# Patient Record
Sex: Male | Born: 1955
Health system: Southern US, Community
[De-identification: ages and names within clinical notes are randomized; demographics above are authoritative.]

## PROBLEM LIST (undated history)

## (undated) DIAGNOSIS — F79 Unspecified intellectual disabilities: Secondary | ICD-10-CM

## (undated) DIAGNOSIS — E876 Hypokalemia: Secondary | ICD-10-CM

## (undated) DIAGNOSIS — R05 Cough: Secondary | ICD-10-CM

## (undated) DIAGNOSIS — N400 Enlarged prostate without lower urinary tract symptoms: Secondary | ICD-10-CM

## (undated) DIAGNOSIS — R1310 Dysphagia, unspecified: Secondary | ICD-10-CM

## (undated) DIAGNOSIS — Z8489 Family history of other specified conditions: Secondary | ICD-10-CM

## (undated) DIAGNOSIS — R053 Chronic cough: Secondary | ICD-10-CM

## (undated) DIAGNOSIS — R41841 Cognitive communication deficit: Secondary | ICD-10-CM

## (undated) DIAGNOSIS — T8859XA Other complications of anesthesia, initial encounter: Secondary | ICD-10-CM

## (undated) DIAGNOSIS — F72 Severe intellectual disabilities: Secondary | ICD-10-CM

## (undated) DIAGNOSIS — A419 Sepsis, unspecified organism: Secondary | ICD-10-CM

## (undated) DIAGNOSIS — T4145XA Adverse effect of unspecified anesthetic, initial encounter: Secondary | ICD-10-CM

## (undated) DIAGNOSIS — M4000 Postural kyphosis, site unspecified: Secondary | ICD-10-CM

## (undated) DIAGNOSIS — I10 Essential (primary) hypertension: Secondary | ICD-10-CM

## (undated) DIAGNOSIS — N390 Urinary tract infection, site not specified: Secondary | ICD-10-CM

## (undated) DIAGNOSIS — M6281 Muscle weakness (generalized): Secondary | ICD-10-CM

## (undated) DIAGNOSIS — I509 Heart failure, unspecified: Secondary | ICD-10-CM

## (undated) DIAGNOSIS — E870 Hyperosmolality and hypernatremia: Secondary | ICD-10-CM

## (undated) DIAGNOSIS — M1990 Unspecified osteoarthritis, unspecified site: Secondary | ICD-10-CM

## (undated) DIAGNOSIS — R269 Unspecified abnormalities of gait and mobility: Secondary | ICD-10-CM

## (undated) DIAGNOSIS — G9340 Encephalopathy, unspecified: Secondary | ICD-10-CM

## (undated) DIAGNOSIS — E1110 Type 2 diabetes mellitus with ketoacidosis without coma: Secondary | ICD-10-CM

## (undated) DIAGNOSIS — N289 Disorder of kidney and ureter, unspecified: Secondary | ICD-10-CM

## (undated) DIAGNOSIS — Z87440 Personal history of urinary (tract) infections: Secondary | ICD-10-CM

## (undated) DIAGNOSIS — E039 Hypothyroidism, unspecified: Secondary | ICD-10-CM

## (undated) DIAGNOSIS — R339 Retention of urine, unspecified: Secondary | ICD-10-CM

## (undated) DIAGNOSIS — N3289 Other specified disorders of bladder: Secondary | ICD-10-CM

## (undated) DIAGNOSIS — R7881 Bacteremia: Secondary | ICD-10-CM

## (undated) HISTORY — PX: KIDNEY STONE SURGERY: SHX686

## (undated) HISTORY — PX: OTHER SURGICAL HISTORY: SHX169

---

## 2003-02-11 ENCOUNTER — Encounter: Payer: Self-pay | Admitting: Family Medicine

## 2003-02-11 ENCOUNTER — Ambulatory Visit (HOSPITAL_COMMUNITY): Admission: RE | Admit: 2003-02-11 | Discharge: 2003-02-11 | Payer: Self-pay | Admitting: Family Medicine

## 2003-04-23 ENCOUNTER — Ambulatory Visit (HOSPITAL_COMMUNITY): Admission: RE | Admit: 2003-04-23 | Discharge: 2003-04-23 | Payer: Self-pay | Admitting: Family Medicine

## 2003-05-25 ENCOUNTER — Inpatient Hospital Stay (HOSPITAL_COMMUNITY): Admission: EM | Admit: 2003-05-25 | Discharge: 2003-05-29 | Payer: Self-pay | Admitting: Emergency Medicine

## 2003-06-01 ENCOUNTER — Emergency Department (HOSPITAL_COMMUNITY): Admission: EM | Admit: 2003-06-01 | Discharge: 2003-06-01 | Payer: Self-pay | Admitting: Emergency Medicine

## 2003-12-05 ENCOUNTER — Ambulatory Visit (HOSPITAL_COMMUNITY): Admission: RE | Admit: 2003-12-05 | Discharge: 2003-12-05 | Payer: Self-pay | Admitting: *Deleted

## 2004-08-24 ENCOUNTER — Ambulatory Visit (HOSPITAL_COMMUNITY): Admission: RE | Admit: 2004-08-24 | Discharge: 2004-08-24 | Payer: Self-pay | Admitting: Family Medicine

## 2005-07-28 ENCOUNTER — Ambulatory Visit (HOSPITAL_COMMUNITY): Admission: RE | Admit: 2005-07-28 | Discharge: 2005-07-28 | Payer: Self-pay | Admitting: Internal Medicine

## 2005-09-06 ENCOUNTER — Ambulatory Visit (HOSPITAL_COMMUNITY): Admission: RE | Admit: 2005-09-06 | Discharge: 2005-09-06 | Payer: Self-pay | Admitting: Urology

## 2005-09-23 ENCOUNTER — Inpatient Hospital Stay (HOSPITAL_COMMUNITY): Admission: RE | Admit: 2005-09-23 | Discharge: 2005-09-27 | Payer: Self-pay | Admitting: Urology

## 2005-09-28 ENCOUNTER — Encounter (HOSPITAL_COMMUNITY): Admission: RE | Admit: 2005-09-28 | Discharge: 2005-10-28 | Payer: Self-pay | Admitting: Urology

## 2005-09-28 ENCOUNTER — Ambulatory Visit (HOSPITAL_COMMUNITY): Payer: Self-pay | Admitting: Urology

## 2006-07-29 ENCOUNTER — Ambulatory Visit (HOSPITAL_COMMUNITY): Admission: RE | Admit: 2006-07-29 | Discharge: 2006-07-29 | Payer: Self-pay | Admitting: Urology

## 2006-08-10 ENCOUNTER — Ambulatory Visit (HOSPITAL_COMMUNITY): Admission: RE | Admit: 2006-08-10 | Discharge: 2006-08-10 | Payer: Self-pay | Admitting: Urology

## 2007-05-11 ENCOUNTER — Ambulatory Visit (HOSPITAL_COMMUNITY): Admission: RE | Admit: 2007-05-11 | Discharge: 2007-05-11 | Payer: Self-pay | Admitting: Urology

## 2007-09-22 ENCOUNTER — Ambulatory Visit (HOSPITAL_COMMUNITY): Admission: RE | Admit: 2007-09-22 | Discharge: 2007-09-22 | Payer: Self-pay | Admitting: Urology

## 2008-03-21 ENCOUNTER — Ambulatory Visit (HOSPITAL_COMMUNITY): Admission: RE | Admit: 2008-03-21 | Discharge: 2008-03-21 | Payer: Self-pay | Admitting: Urology

## 2008-11-20 ENCOUNTER — Ambulatory Visit (HOSPITAL_COMMUNITY): Admission: RE | Admit: 2008-11-20 | Discharge: 2008-11-20 | Payer: Self-pay | Admitting: Urology

## 2009-06-02 ENCOUNTER — Ambulatory Visit (HOSPITAL_COMMUNITY): Admission: RE | Admit: 2009-06-02 | Discharge: 2009-06-02 | Payer: Self-pay | Admitting: Urology

## 2009-06-28 ENCOUNTER — Emergency Department (HOSPITAL_COMMUNITY): Admission: EM | Admit: 2009-06-28 | Discharge: 2009-06-28 | Payer: Self-pay | Admitting: Emergency Medicine

## 2009-11-26 ENCOUNTER — Ambulatory Visit (HOSPITAL_COMMUNITY): Admission: RE | Admit: 2009-11-26 | Discharge: 2009-11-26 | Payer: Self-pay | Admitting: Urology

## 2009-12-10 ENCOUNTER — Ambulatory Visit (HOSPITAL_COMMUNITY): Admission: RE | Admit: 2009-12-10 | Discharge: 2009-12-10 | Payer: Self-pay | Admitting: Urology

## 2010-01-12 ENCOUNTER — Ambulatory Visit (HOSPITAL_COMMUNITY): Admission: RE | Admit: 2010-01-12 | Discharge: 2010-01-12 | Payer: Self-pay | Admitting: Urology

## 2010-05-05 ENCOUNTER — Ambulatory Visit (HOSPITAL_COMMUNITY)
Admission: RE | Admit: 2010-05-05 | Discharge: 2010-05-05 | Payer: Self-pay | Source: Home / Self Care | Admitting: Urology

## 2010-08-13 LAB — STONE ANALYSIS: Stone Weight KSTONE: 0.21 g

## 2010-08-14 LAB — SURGICAL PCR SCREEN
MRSA, PCR: NEGATIVE
Staphylococcus aureus: NEGATIVE

## 2010-08-14 LAB — BASIC METABOLIC PANEL
CO2: 29 mEq/L (ref 19–32)
Calcium: 9.5 mg/dL (ref 8.4–10.5)
GFR calc Af Amer: >60 mL/min (ref >60)
GFR calc non Af Amer: >60 mL/min (ref >60)
Sodium: 140 mEq/L (ref 135–145)

## 2010-08-14 LAB — CBC
Hemoglobin: 14.1 g/dL (ref 13.0–17.0)
MCH: 31.2 pg (ref 26.0–34.0)
Platelets: 261 10*3/uL (ref 150–400)
RBC: 4.53 MIL/uL (ref 4.22–5.81)
WBC: 7.6 10*3/uL (ref 4.0–10.5)

## 2010-09-01 ENCOUNTER — Other Ambulatory Visit (HOSPITAL_COMMUNITY): Payer: Self-pay | Admitting: Urology

## 2010-09-01 DIAGNOSIS — N323 Diverticulum of bladder: Secondary | ICD-10-CM

## 2010-09-01 DIAGNOSIS — N390 Urinary tract infection, site not specified: Secondary | ICD-10-CM

## 2010-09-08 ENCOUNTER — Ambulatory Visit (HOSPITAL_COMMUNITY)
Admission: RE | Admit: 2010-09-08 | Discharge: 2010-09-08 | Disposition: A | Discharge status: Home / Self Care | Payer: Medicare Other | Source: Ambulatory Visit | Attending: Urology | Admitting: Urology

## 2010-09-08 DIAGNOSIS — R9389 Abnormal findings on diagnostic imaging of other specified body structures: Secondary | ICD-10-CM | POA: Insufficient documentation

## 2010-09-08 DIAGNOSIS — N390 Urinary tract infection, site not specified: Secondary | ICD-10-CM

## 2010-09-08 DIAGNOSIS — N200 Calculus of kidney: Secondary | ICD-10-CM | POA: Insufficient documentation

## 2010-09-08 DIAGNOSIS — N323 Diverticulum of bladder: Secondary | ICD-10-CM | POA: Insufficient documentation

## 2010-10-16 NOTE — Discharge Summary (Signed)
[name], [name]                ACCOUNT NO.:  [id number]   MEDICAL RECORD NO.:  [id number]          PATIENT TYPE:  INP   LOCATION:  A323                          FACILITY:  [facility]   PHYSICIAN:  Dennie Maizes, M.D.   DATE OF BIRTH:  1955/07/14   DATE OF ADMISSION:  09/23/2005  DATE OF DISCHARGE:  04/30/2007LH                                 DISCHARGE SUMMARY   CONSULTING PHYSICIAN:  Madelin Rear. Sherwood Gambler, MD   FINAL DIAGNOSES:  1.  Large bladder calculi.  2.  Large bladder diverticulum.  3.  Recurrent urinary tract infections.  4.  Left renal calculus.   OTHER DIAGNOSIS:  Mental retardation.   OPERATIVE PROCEDURE:  Vesicolithotomy done on September 23, 2005.   COMPLICATIONS:  None.   DISCHARGE SUMMARY:  This 55 year old mentally retarded male is taken care of  by his mother.  He has a history of recurrent urinary tract infections.  His  urine was found to be cloudy.  He was not having any fever, chills or  swallowing difficulty.  There was no history of hematuria or dysuria.  Evaluation was done with CT scan of abdomen and pelvis as well as x-ray of  the KUB area.  This revealed large bladder calculi x5.  The largest calculus  measured 3 cm in size.  Four more calculi each measuring about 1.5 cm in  size were noted inside a bladder diverticula.  There was a 1-cm size left  upper pole renal calculus.   The stones were noted about 2 years ago.  The patient's family declined  surgical intervention at that time.  The stones have been increasing in size  and he has had recurrent urinary tract infections.  He has a history of  having urinary retention several years ago.   PAST MEDICAL HISTORY:  1.  Mental retardation.  2.  Recurrent urinary tract infections.   MEDICATIONS:  Zyrtec and Cipro.   ALLERGIES:  NONE.   EXAMINATION:  HEAD, EYES, EARS, NOSE AND THROAT:  Normal.  NECK:  No mass.  LUNGS:  Clear to auscultation.  HEART:  Regular rate and rhythm, no murmurs.  ABDOMEN:   Soft, no palpable flank mass or CVA tenderness.  bladder  not  palpable.  Penis and testes normal.  RECTAL: benign  prostate.   COURSE IN THE HOSPITAL:  Preoperative labs CBC WBC 6.7, hemoglobin 14.4,  hematocrit 42, BUN 12, creatinine 1, glucose elevated at 120.  Urine culture  and sensitivity revealed more than 100,000 colonies of multiple species of  organisms.  Preoperative EKG revealed normal sinus rhythm with a heart rate  of 98.   COURSE IN THE HOSPITAL:  The patient was taken to the short stay center on  September 23, 2005.  Under general anesthesia, vesicolithotomy was done.  Five  large bladder calculi were removed.  The patient was on Foley catheter  drainage postoperatively.  His vital signs were stable in the postoperative  period.  Dr. Sherwood Gambler was consulted for management of his medical problems.  He  was found to have elevated glucose and seen by Dr.  Fusco.  The patient's  previous urinalysis revealed urinalysis and he was treated with IV  vancomycin for staph UTI.  The patient did well in the postoperative period.  His JP drainage was minimal and the JP drain was removed on the second  postoperative day.  Abdominal examination was unremarkable and his labs were  stable.  BUN 9, creatinine 1.  CBC WBC 10.1, hemoglobin 13.6, hematocrit  39.7.  The patient was started on full liquid diet which was gradually  increased to regular diet.  He had bowel sounds with return of bowel  function.  He was passing flatus.  His potassium was found to be low on  September 26, 2005 and potassium supplements were given.  The patient was  started on a regular diet.  On September 27, 2005, the patient was found to be  doing well on a regular diet.  He had a bowel movement.  Abdominal  examination was unremarkable and the incision was found to be healing well.   He was discharged and sent home on September 27, 2005 with a Foley catheter and  urine bag.  IV vancomycin will be continued for three more days.  He  will be  seen by Dr. Sherwood Gambler for elevated glucose and further management.  He will  return to the office in one week at which time removal of the staples and  removal of the Foley catheter will be done.  The patient was advised to call  me for fever, chills, hematuria or blocked catheter.      Dennie Maizes, M.D.  Electronically Signed     SK/MEDQ  D:  11/22/2005  T:  11/22/2005  Job:  [id number]   cc:   Madelin Rear. Sherwood Gambler, MD  Fax: [phone]

## 2010-10-16 NOTE — Op Note (Signed)
[name], [name]                ACCOUNT NO.:  [id number]   MEDICAL RECORD NO.:  [id number]          PATIENT TYPE:  INP   LOCATION:  A323                          FACILITY:  [facility]   PHYSICIAN:  Dennie Maizes, M.D.   DATE OF BIRTH:  18-Apr-1956   DATE OF PROCEDURE:  09/23/2005  DATE OF DISCHARGE:                                 OPERATIVE REPORT   PREOPERATIVE DIAGNOSES:  Large bladder calculi x5, recurrent urinary tract  infections, bladder diverticulum.   POSTOPERATIVE DIAGNOSIS:  Large bladder calculi x5, recurrent urinary tract  infections, bladder diverticulum.   OPERATIVE PROCEDURE:  Vesical lithotomy.   ANESTHESIA:  General.   SURGEON:  Dennie Maizes, M.D.   COMPLICATIONS:  None.   ESTIMATED BLOOD LOSS:  50 mL.   DRAINS:  20-French Foley catheter in the bladder, JP drain x1 in the  prevesical space.   SPECIMEN:  Bladder calculi x5 which were given to the patient's family.   INDICATIONS FOR PROCEDURE:  This 55 year old mentally retarded male has a  history of recurrent urinary tract infections.  Evaluation revealed large  bladder calculi.  There is a 3 cm size stone in the bladder and four 1.5 cm  size stones were noted in the bladder diverticulum  arising from  the right  lateral wall.  The patient was taken to the OR today for vesical lithotomy.   DESCRIPTION OF PROCEDURE:  General anesthesia was induced and the patient  was placed on the OR table in the supine position.  The lower abdomen and  genitalia were prepped and draped in a sterile fashion.  A 20-French Foley  catheter was inserted into the bladder.  The urine was drained and urine  culture and sensitivity was done.  The bladder was then filled with 350 mL  of sterile water and the catheter was clamped.  A midline suprapubic  incision was then made.  The subcutaneous tissues are divided and the rectus  sheath was noted.  The rectus sheath was divided in the midline.  The recti  muscles were retracted  laterally.  The peritoneum was then reflected upwards  and the anterior surface of bladder was exposed.  Two stay sutures using 3-0  Vicryl was then applied over the anterior surface of the bladder.  A  cystotomy was then made.  Distal examination of the bladder revealed a large  bladder stone.  The stone was removed without any difficulty.  The bladder  wall was then distracted and the bladder was closely inspected.  There were  multiple bladder diverticula.  There was a diverticulum on the right side.  Distal exploration of the diverticulum revealed four small stones.  The  stones were extracted using stone forceps.  The patient had five stones in  total.  The bladder was then irrigated with saline.  The diverticulum was  irrigated with sterile saline.  The cystotomy incision was then closed in  two layers using 3-0 Vicryl.  Bladder irrigation was done and the  anastomosis was found to be watertight.  A JP drain was then left in the  prevesical space.  The rectus sheath was closed using #0 PDS.  The incision  was then irrigated with saline and the subcutaneous tissues were  approximated using 3-0 plain gut.  The skin was closed using staples.  The  JP drain from the prevesical area was then brought out through a separate  stab incision and anchored to the skin with 3-0 silk.  An abdominal dressing  was applied.  The sponges and instruments were correct x2 at the time of  closure.  Estimated blood loss was about 50 mL. The patient was transferred  to the PACU in a satisfactory condition.      Dennie Maizes, M.D.  Electronically Signed     SK/MEDQ  D:  09/23/2005  T:  09/24/2005  Job:  [id number]   cc:   Madelin Rear. Sherwood Gambler, MD  Fax: [phone]

## 2010-10-16 NOTE — H&P (Signed)
NAMECHARBEL, LOS                ACCOUNT NO.:  [id number]   MEDICAL RECORD NO.:  [id number]          PATIENT TYPE:  AMB   LOCATION:  DAY                           FACILITY:  [facility]   PHYSICIAN:  Dennie Maizes, M.D.   DATE OF BIRTH:  January 14, 1956   DATE OF ADMISSION:  09/23/2005  DATE OF DISCHARGE:  LH                                HISTORY & PHYSICAL   CHIEF COMPLAINT:  Recurrent urinary tract infections, large bladder calculi,  left renal calculus.   HISTORY OF PRESENT ILLNESS:  This 55 year old male is mentally retarded.  His mother takes care of him.  He has had a history of recurrent urinary  tract infections.  His urine is found to be cloudy.  He is not having any  fever, chills, or voiding difficulty at present.  There is no history of  hematuria or dysuria.  He has undergone recent evaluation with CT scan of  the abdomen and pelvis as well as x-ray of the KUB area.  This revealed a  large bladder calculus measuring 3 cm in size.  Four more calculi were noted  in the bladder and diverticulum.  These calculi measured about 1.5 cm each  in size.  There is also a 1 cm size left upper pole renal calculus.   These stones were noted in x-rays two years ago.  The patient's family  declined surgical intervention at that time.  The stones have been  increasing in size for the past few years.  He has history of having urinary  retention several years ago.   PAST MEDICAL HISTORY:  1.  History of mental retardation.  2.  Recurrent urinary tract infections.   MEDICATIONS:  Zyrtec, Cipro.   ALLERGIES:  None.   PHYSICAL EXAMINATION:  HEENT:  Normal.  NECK:  No masses.  LUNGS:  Clear to auscultation.  HEART:  Regular rate and rhythm.  No murmurs.  ABDOMEN:  Soft.  No palpable frank mass or CVA tenderness.  Bladder is not  palpable.  GENITOURINARY:  Penis normal.  Testes are normal.  RECTAL:  Benign prostate, about 34 g in size.   IMPRESSION:  1.  Large bladder calculi.  2.   Calculi in the bladder diverticulum.  3.  Recurrent urinary tract infections.  4.  Large left renal calculus.   PLAN:  I discussed with the patient at this time regarding the diagnosis and  management options.  He is cleared to undergo vesical lithotomy in short  stay center.  I explained to him regarding the diagnosis, operative details,  alternative treatments, outcome, possible risks and complications, and they  agreed for the procedure to be done.  The patient will need catheter  drainage for a week after the surgery.  He will be admitted to the hospital  in the postoperative period.      Dennie Maizes, M.D.  Electronically Signed     SK/MEDQ  D:  09/23/2005  T:  09/23/2005  Job:  [id number]   cc:   Madelin Rear. Sherwood Gambler, MD  Fax: [phone]

## 2010-10-16 NOTE — Discharge Summary (Signed)
[name], [name]                          ACCOUNT NO.:  [id number]   MEDICAL RECORD NO.:  [id number]                   PATIENT TYPE:  INP   LOCATION:  A302                                 FACILITY:  [facility]   PHYSICIAN:  Kirk Ruths, M.D.            DATE OF BIRTH:  [birth year]   DATE OF ADMISSION:  05/25/2003  DATE OF DISCHARGE:  05/29/2003                                 DISCHARGE SUMMARY   DISCHARGE DIAGNOSES:  1. Rib contusion secondary fall.  2. Urinary tract infection.  3. History of hypertension.   HOSPITAL COURSE:  This is a 55 year old, moderately retarded, white male who  is cared for by his elderly mother.  On the day of admission the patient was  turning in the room and tripped and fell and was unable to move with pain in  his chest. He was seen and evaluated in the emergency room where he  underwent multiple x-rays including chest and pelvis without significant  findings.  The patient was noted to have nitrate positive urine on admission  and was treated with white cells.  Urine culture was negative.  The patient  was treated empirically with Levaquin during this stay here and has remained  here.  I feel like simply urinary tract infection has been adequately  treated so he will not be discharged on antibiotics.   The patient received some physical therapy as he had difficulty arising and  ambulating due to soreness. There is some complaint of some pain in his  right hip area although he is able to bear weight.  Will x-ray his right hip  before discharge today and he will be discharged home. Home health will be  following the patient.   DISCHARGE MEDICATIONS:  He will take his previous medications which include:  Benicar, HCTZ, and Advil 600-800 mg 3 times a day for pain.   FOLLOW UP:  He will be followed in our office expectantly.  Stable at the  time of discharge.     ___________________________________________  Kirk Ruths, M.D.   WMM/MEDQ  D:  05/29/2003  T:  05/29/2003  Job:  [id number]

## 2010-10-16 NOTE — H&P (Signed)
[name], [name]                          ACCOUNT NO.:  [id number]   MEDICAL RECORD NO.:  [id number]                   PATIENT TYPE:  INP   LOCATION:  A302                                 FACILITY:  [facility]   PHYSICIAN:  Kirk Ruths, M.D.            DATE OF BIRTH:  [birth year]   DATE OF ADMISSION:  05/25/2003  DATE OF DISCHARGE:                                HISTORY & PHYSICAL   CHIEF COMPLAINT:  Pain on movement after a fall.   PRESENTING ILLNESS:  This is a 55 year old mental retard who lives with his  mom.  The patient before admission fell as he turned on the living room  floor which is carpeted. The patient was unable to move due to chest pain.  He was brought to the hospital by ambulance.  At the hospital he also was  noted to have some pubic tenderness, but x-rays of his pubis were negative.  Chest x-ray showed some cardiomegaly although he was unable to sit or breath  appropriately for x-ray.  Also he was noted to have urinary tract infection,  incidentally.  Due to the patient's inability to move, due to pain, he was  admitted for pain control for probable rib contusion or occult fracture.   ALLERGIES:  He is allergic to no medications.   MEDICATIONS:  Benicar HCT.   REVIEW OF SYSTEMS:  Denies nausea, vomiting, dysuria, or pain in the  extremities.   PHYSICAL EXAMINATION:  GENERAL:  A middle-aged white male appearing  comfortable lying on a gurney.  VITAL SIGNS:  The blood pressure 150/90, pulse 80, respirations 20.  HEENT:  TMs are normal.  Pupils are equal react to light and accommodation.  Oropharynx benign.  NECK:  Supple without JVD, bruit or thyromegaly.  LUNGS:  Clear in all areas.  There is right-sided chest wall tenderness.  ABDOMEN:  Soft and nontender with some questionable point tenderness in his  right suprapubic area.  EXTREMITIES:  Without clubbing, cyanosis, or edema.  NEUROLOGIC:  Exam is grossly intact.   ASSESSMENT:  1. Chest pain, rule  out occult rib fracture.  2. Hypertension.  3. Presyncope.  4. Urinary tract infection.     ___________________________________________                                         Kirk Ruths, M.D.   WMM/MEDQ  D:  05/25/2003  T:  05/25/2003  Job:  [id number]

## 2010-10-30 ENCOUNTER — Other Ambulatory Visit (HOSPITAL_COMMUNITY): Payer: Self-pay | Admitting: *Deleted

## 2010-10-30 DIAGNOSIS — N319 Neuromuscular dysfunction of bladder, unspecified: Secondary | ICD-10-CM

## 2010-10-30 DIAGNOSIS — R339 Retention of urine, unspecified: Secondary | ICD-10-CM

## 2010-11-03 ENCOUNTER — Ambulatory Visit (HOSPITAL_COMMUNITY)
Admission: RE | Admit: 2010-11-03 | Discharge: 2010-11-03 | Disposition: A | Discharge status: Home / Self Care | Payer: Medicare Other | Source: Ambulatory Visit | Attending: Urology | Admitting: Urology

## 2010-11-03 DIAGNOSIS — N323 Diverticulum of bladder: Secondary | ICD-10-CM | POA: Insufficient documentation

## 2010-11-03 DIAGNOSIS — N200 Calculus of kidney: Secondary | ICD-10-CM | POA: Insufficient documentation

## 2010-11-03 DIAGNOSIS — R339 Retention of urine, unspecified: Secondary | ICD-10-CM | POA: Insufficient documentation

## 2010-11-03 DIAGNOSIS — N319 Neuromuscular dysfunction of bladder, unspecified: Secondary | ICD-10-CM

## 2010-11-03 DIAGNOSIS — R4182 Altered mental status, unspecified: Secondary | ICD-10-CM | POA: Insufficient documentation

## 2010-11-10 ENCOUNTER — Ambulatory Visit (HOSPITAL_COMMUNITY): Payer: Medicare Other

## 2011-05-29 ENCOUNTER — Inpatient Hospital Stay (HOSPITAL_COMMUNITY)
Admission: EM | Admit: 2011-05-29 | Discharge: 2011-06-07 | DRG: 638 | Disposition: A | Discharge status: Home Health | Payer: Medicare Other | Attending: Internal Medicine | Admitting: Internal Medicine

## 2011-05-29 ENCOUNTER — Encounter: Payer: Self-pay | Admitting: *Deleted

## 2011-05-29 ENCOUNTER — Other Ambulatory Visit: Payer: Self-pay

## 2011-05-29 ENCOUNTER — Inpatient Hospital Stay (HOSPITAL_COMMUNITY): Payer: Medicare Other

## 2011-05-29 ENCOUNTER — Emergency Department (HOSPITAL_COMMUNITY): Payer: Medicare Other

## 2011-05-29 DIAGNOSIS — N19 Unspecified kidney failure: Secondary | ICD-10-CM | POA: Diagnosis present

## 2011-05-29 DIAGNOSIS — E1110 Type 2 diabetes mellitus with ketoacidosis without coma: Secondary | ICD-10-CM | POA: Diagnosis present

## 2011-05-29 DIAGNOSIS — Z79899 Other long term (current) drug therapy: Secondary | ICD-10-CM

## 2011-05-29 DIAGNOSIS — B964 Proteus (mirabilis) (morganii) as the cause of diseases classified elsewhere: Secondary | ICD-10-CM | POA: Diagnosis present

## 2011-05-29 DIAGNOSIS — N179 Acute kidney failure, unspecified: Secondary | ICD-10-CM | POA: Diagnosis present

## 2011-05-29 DIAGNOSIS — R5381 Other malaise: Secondary | ICD-10-CM | POA: Diagnosis not present

## 2011-05-29 DIAGNOSIS — E1010 Type 1 diabetes mellitus with ketoacidosis without coma: Secondary | ICD-10-CM | POA: Diagnosis not present

## 2011-05-29 DIAGNOSIS — E870 Hyperosmolality and hypernatremia: Secondary | ICD-10-CM | POA: Diagnosis not present

## 2011-05-29 DIAGNOSIS — I10 Essential (primary) hypertension: Secondary | ICD-10-CM | POA: Diagnosis present

## 2011-05-29 DIAGNOSIS — N390 Urinary tract infection, site not specified: Secondary | ICD-10-CM | POA: Diagnosis present

## 2011-05-29 DIAGNOSIS — A419 Sepsis, unspecified organism: Secondary | ICD-10-CM | POA: Diagnosis not present

## 2011-05-29 DIAGNOSIS — E876 Hypokalemia: Secondary | ICD-10-CM | POA: Diagnosis not present

## 2011-05-29 DIAGNOSIS — E875 Hyperkalemia: Secondary | ICD-10-CM | POA: Diagnosis present

## 2011-05-29 DIAGNOSIS — F79 Unspecified intellectual disabilities: Secondary | ICD-10-CM | POA: Diagnosis present

## 2011-05-29 DIAGNOSIS — R7881 Bacteremia: Secondary | ICD-10-CM | POA: Diagnosis not present

## 2011-05-29 DIAGNOSIS — D7289 Other specified disorders of white blood cells: Secondary | ICD-10-CM | POA: Diagnosis not present

## 2011-05-29 DIAGNOSIS — I509 Heart failure, unspecified: Secondary | ICD-10-CM | POA: Diagnosis not present

## 2011-05-29 DIAGNOSIS — Z23 Encounter for immunization: Secondary | ICD-10-CM

## 2011-05-29 DIAGNOSIS — R5383 Other fatigue: Secondary | ICD-10-CM | POA: Diagnosis not present

## 2011-05-29 DIAGNOSIS — N200 Calculus of kidney: Secondary | ICD-10-CM | POA: Diagnosis present

## 2011-05-29 DIAGNOSIS — E782 Mixed hyperlipidemia: Secondary | ICD-10-CM | POA: Diagnosis not present

## 2011-05-29 DIAGNOSIS — Q619 Cystic kidney disease, unspecified: Secondary | ICD-10-CM | POA: Diagnosis not present

## 2011-05-29 DIAGNOSIS — R6521 Severe sepsis with septic shock: Secondary | ICD-10-CM

## 2011-05-29 HISTORY — DX: Unspecified intellectual disabilities: F79

## 2011-05-29 HISTORY — DX: Urinary tract infection, site not specified: N39.0

## 2011-05-29 HISTORY — DX: Essential (primary) hypertension: I10

## 2011-05-29 LAB — CBC
HCT: 36.8 % — ABNORMAL LOW (ref 39.0–52.0)
Hemoglobin: 12 g/dL — ABNORMAL LOW (ref 13.0–17.0)
MCH: 31.2 pg (ref 26.0–34.0)
MCH: 31 pg (ref 26.0–34.0)
MCHC: 29 g/dL — ABNORMAL LOW (ref 30.0–36.0)
MCHC: 32.6 g/dL (ref 30.0–36.0)
MCV: 95.1 fL (ref 78.0–100.0)
RDW: 13.1 % (ref 11.5–15.5)

## 2011-05-29 LAB — DIFFERENTIAL
Eosinophils Absolute: 0 10*3/uL (ref 0.0–0.7)
Eosinophils Relative: 0 % (ref 0–5)
Monocytes Relative: 6 % (ref 3–12)
Myelocytes: 0 %
Neutro Abs: 19.7 10*3/uL — ABNORMAL HIGH (ref 1.7–7.7)
Neutrophils Relative %: 93 % — ABNORMAL HIGH (ref 43–77)
nRBC: 0 /100 WBC

## 2011-05-29 LAB — URINALYSIS, ROUTINE W REFLEX MICROSCOPIC
Bilirubin Urine: NEGATIVE
Nitrite: NEGATIVE
Protein, ur: NEGATIVE mg/dL
Urobilinogen, UA: 0.2 mg/dL (ref 0.0–1.0)

## 2011-05-29 LAB — BLOOD GAS, VENOUS
Acid-base deficit: 18.4 mmol/L — ABNORMAL HIGH (ref 0.0–2.0)
O2 Content: 21 L/min
O2 Saturation: 97.7 %
pO2, Ven: 110 mmHg — ABNORMAL HIGH (ref 30.0–45.0)

## 2011-05-29 LAB — BASIC METABOLIC PANEL
BUN: 74 mg/dL — ABNORMAL HIGH (ref 6–23)
Calcium: 7.5 mg/dL — ABNORMAL LOW (ref 8.4–10.5)
GFR calc non Af Amer: 22 mL/min — ABNORMAL LOW (ref >90)
Glucose, Bld: 1016 mg/dL (ref 70–99)
Sodium: 140 mEq/L (ref 135–145)

## 2011-05-29 LAB — POCT I-STAT, CHEM 8
BUN: 85 mg/dL — ABNORMAL HIGH (ref 6–23)
Chloride: 97 mEq/L (ref 96–112)
Creatinine, Ser: 3.5 mg/dL — ABNORMAL HIGH (ref 0.50–1.35)
Sodium: 125 mEq/L — ABNORMAL LOW (ref 135–145)
TCO2: 12 mmol/L (ref 0–100)

## 2011-05-29 LAB — COMPREHENSIVE METABOLIC PANEL
Alkaline Phosphatase: 74 U/L (ref 39–117)
BUN: 84 mg/dL — ABNORMAL HIGH (ref 6–23)
Calcium: 9 mg/dL (ref 8.4–10.5)
GFR calc Af Amer: 22 mL/min — ABNORMAL LOW (ref >90)
Glucose, Bld: 1556 mg/dL (ref 70–99)
Total Protein: 7.6 g/dL (ref 6.0–8.3)

## 2011-05-29 LAB — INFLUENZA PANEL BY PCR (TYPE A & B): H1N1 flu by pcr: NOT DETECTED

## 2011-05-29 LAB — GLUCOSE, CAPILLARY
Glucose-Capillary: >600 mg/dL (ref 70–99)
Glucose-Capillary: >600 mg/dL (ref 70–99)

## 2011-05-29 MED ORDER — SODIUM CHLORIDE 0.9 % IJ SOLN
INTRAMUSCULAR | Status: AC
  Administered 2011-05-30: 10 mL
  Filled 2011-05-29: qty 40

## 2011-05-29 MED ORDER — INSULIN REGULAR BOLUS VIA INFUSION
0.0000 [IU] | Freq: Three times a day (TID) | INTRAVENOUS | Status: DC
  Filled 2011-05-29 (×5): qty 10

## 2011-05-29 MED ORDER — VANCOMYCIN HCL 1000 MG IV SOLR
750.0000 mg | INTRAVENOUS | Status: DC
  Administered 2011-05-30: 750 mg via INTRAVENOUS
  Filled 2011-05-29 (×2): qty 750

## 2011-05-29 MED ORDER — DEXTROSE-NACL 5-0.45 % IV SOLN
INTRAVENOUS | Status: DC
  Administered 2011-05-30 (×2): via INTRAVENOUS

## 2011-05-29 MED ORDER — SODIUM CHLORIDE 0.9 % IV BOLUS (SEPSIS)
1000.0000 mL | Freq: Once | INTRAVENOUS | Status: AC
  Administered 2011-06-02: 1000 mL via INTRAVENOUS

## 2011-05-29 MED ORDER — SODIUM CHLORIDE 0.9 % IV SOLN
INTRAVENOUS | Status: DC
  Administered 2011-05-29: 23:00:00 via INTRAVENOUS

## 2011-05-29 MED ORDER — DEXTROSE 50 % IV SOLN: 25.0000 mL | INTRAVENOUS | Status: DC | PRN

## 2011-05-29 MED ORDER — SODIUM CHLORIDE 0.9 % IV SOLN
INTRAVENOUS | Status: DC
  Administered 2011-05-29: 20:00:00 via INTRAVENOUS
  Filled 2011-05-29: qty 1

## 2011-05-29 MED ORDER — SODIUM CHLORIDE 0.9 % IV SOLN: 250.0000 mL | INTRAVENOUS | Status: DC | PRN

## 2011-05-29 MED ORDER — PIPERACILLIN-TAZOBACTAM IN DEX 2-0.25 GM/50ML IV SOLN
2.2500 g | Freq: Three times a day (TID) | INTRAVENOUS | Status: DC
  Administered 2011-05-30 – 2011-05-31 (×4): 2.25 g via INTRAVENOUS
  Filled 2011-05-29 (×6): qty 50

## 2011-05-29 MED ORDER — OSELTAMIVIR PHOSPHATE 6 MG/ML PO SUSR
ORAL | Status: AC
  Filled 2011-05-29: qty 1

## 2011-05-29 MED ORDER — SODIUM CHLORIDE 0.9 % IV BOLUS (SEPSIS)
1000.0000 mL | Freq: Once | INTRAVENOUS | Status: AC
  Administered 2011-05-29: 1000 mL via INTRAVENOUS

## 2011-05-29 MED ORDER — SODIUM CHLORIDE 0.9 % IV SOLN: INTRAVENOUS | Status: DC

## 2011-05-29 MED ORDER — OSELTAMIVIR PHOSPHATE 75 MG PO CAPS: 75.0000 mg | ORAL_CAPSULE | Freq: Once | ORAL | Status: DC

## 2011-05-29 MED ORDER — DEXTROSE-NACL 5-0.45 % IV SOLN: INTRAVENOUS | Status: DC

## 2011-05-29 MED ORDER — PIPERACILLIN-TAZOBACTAM 3.375 G IVPB
3.3750 g | Freq: Once | INTRAVENOUS | Status: AC
  Administered 2011-05-29: 3.375 g via INTRAVENOUS
  Filled 2011-05-29: qty 50

## 2011-05-29 MED ORDER — HEPARIN SODIUM (PORCINE) 5000 UNIT/ML IJ SOLN
5000.0000 [IU] | Freq: Three times a day (TID) | INTRAMUSCULAR | Status: DC
  Administered 2011-05-29 – 2011-06-02 (×11): 5000 [IU] via SUBCUTANEOUS
  Filled 2011-05-29 (×14): qty 1

## 2011-05-29 MED ORDER — SODIUM CHLORIDE 0.9 % IV SOLN: INTRAVENOUS | Status: AC

## 2011-05-29 MED ORDER — SODIUM CHLORIDE 0.9 % IV SOLN
INTRAVENOUS | Status: DC
  Administered 2011-05-29: 5.4 [IU]/h via INTRAVENOUS
  Administered 2011-05-30: 19.8 [IU]/h via INTRAVENOUS
  Administered 2011-05-30: 11.5 [IU]/h via INTRAVENOUS
  Filled 2011-05-29 (×4): qty 1

## 2011-05-29 MED ORDER — INSULIN REGULAR HUMAN 100 UNIT/ML IJ SOLN
INTRAMUSCULAR | Status: AC
  Filled 2011-05-29: qty 3

## 2011-05-29 MED ORDER — VANCOMYCIN HCL IN DEXTROSE 1-5 GM/200ML-% IV SOLN: 1000.0000 mg | Freq: Once | INTRAVENOUS | Status: DC

## 2011-05-29 NOTE — ED Notes (Signed)
Pt was given tamiflu pt could not tolerate and started coughing. Pt family kept putting water into the pt's mouth despite my efforts to stop them because the pt was coughing.

## 2011-05-29 NOTE — Progress Notes (Signed)
ANTIBIOTIC CONSULT NOTE - INITIAL  Pharmacy Consult for Vancomycin & Zosyn Indication: Empiric UTI  No Known Allergies  Assessment: 55 yo M admitted from [facility] with DKA, septic shock  Pharmacist System-Based Medication Review: Anticoagulation: DVT Prophylaxsis, SQ heparin, follow CBC Infectious Disease: Septic shock, empiric UTI, timiflu 75 daily, D#0 vancomycin and zosyn Endocrinology: DKA on insulin ggt, follow glucose Gastrointestinal / Nutrition: Hyperkalemia Neurology: mental retardation Nephrology: ARF PTA Medication Issues: Not ordered: olmesartan HCT, allegra Best Practices: SCDs, follow up for GI prophylaxsis  Goal of Therapy:  Vancomycin trough level 15-20 mcg/ml, renal adjustment of medications  Plan:  1. Zosyn 2.25g IV q8h  2. Vancomycin 750 mg IV q24h 3. Follow up SCr, UOP, cultures, clinical course and adjust as clinically indicated.  Patient Measurements: Height: 5\' 11"  (180.3 cm) Weight: 155 lb (70.308 kg) IBW/kg (Calculated) : 75.3   Vital Signs: Temp: 99.5 F (37.5 C) (12/29 1604) Temp src: Rectal (12/29 1604) BP: 128/97 mmHg (12/29 1908) Pulse Rate: 106  (12/29 1908) Intake/Output from previous day:   Intake/Output from this shift:    Labs:  Basename 05/29/11 1751 05/29/11 1700  WBC -- 21.2*  HGB 14.3 13.4  PLT -- 420*  LABCREA -- --  CREATININE 3.50* 3.46*   Estimated Creatinine Clearance: 24 ml/min (by C-G formula based on Cr of 3.5). No results found for this basename: VANCOTROUGH:2,VANCOPEAK:2,VANCORANDOM:2,GENTTROUGH:2,GENTPEAK:2,GENTRANDOM:2,TOBRATROUGH:2,TOBRAPEAK:2,TOBRARND:2,AMIKACINPEAK:2,AMIKACINTROU:2,AMIKACIN:2, in the last 72 hours   Microbiology: Recent Results (from the past 720 hour(s))  CULTURE, BLOOD (ROUTINE X 2)     Status: Normal (Preliminary result)   Collection Time   05/29/11  5:30 PM      Component Value Range Status Comment   Specimen Description BLOOD FEMORAL ARTERY   Final    Special Requests      Final    Value: BOTTLES DRAWN AEROBIC AND ANAEROBIC 6CC DRAWN BY RN   Culture PENDING   Incomplete    Report Status PENDING   Incomplete     Medical History: Past Medical History  Diagnosis Date  . MR (mental retardation)   . Hypertension   . UTI (urinary tract infection)     Medications:  Prescriptions prior to admission  Medication Sig Dispense Refill  . fexofenadine (ALLEGRA) 180 MG tablet Take 180 mg by mouth daily.        Marland Kitchen olmesartan-hydrochlorothiazide (BENICAR HCT) 40-25 MG per tablet Take 1 tablet by mouth daily.        Marland Kitchen DISCONTD: ciprofloxacin (CIPRO) 500 MG tablet Take 500 mg by mouth 2 (two) times daily. Patient started on 05/27/11 patient takes for 10 days        [name] 05/29/2011,9:30 PM

## 2011-05-29 NOTE — ED Notes (Signed)
[facility] arrived for transport, report given, family at bedside.

## 2011-05-29 NOTE — ED Notes (Signed)
Pt arrived via [facility] from [facility]. ems reports pt has been weak today ems reports they were dispatched d/t sick call.

## 2011-05-29 NOTE — ED Provider Notes (Signed)
This chart was scribed for [employer]. Rubin Payor, MD by Wallis Mart. The patient was seen in room APA11/APA11 and the patient's care was started at 4:24 PM.   CSN: [id number]  Arrival date & time 05/29/11  1548   First MD Initiated Contact with Patient 05/29/11 1613      Chief Complaint  Patient presents with  . Weakness    (Consider location/radiation/quality/duration/timing/severity/associated sxs/prior treatment) Patient is a 55 y.o. male presenting with weakness. The history is provided by the EMS personnel. The history is limited by the condition of the patient.  Weakness  Additional symptoms include weakness.   Level 5 caveat due to mental retardation, condition of the pt Pt seen at 4:19 PM Jamarea R Mendizabal is a 55 y.o. male who presents to the Emergency Department via EMS complaining of  generalized weakness for a week. Pt arrived from [facility] with elevated GC levels (>600).  Pt reports he has not been eating well.  Pt denies DM.    Past Medical History  Diagnosis Date  . MR (mental retardation)   . Hypertension   . UTI (urinary tract infection)     History reviewed. No pertinent past surgical history.  History reviewed. No pertinent family history.  History  Substance Use Topics  . Smoking status: Never Smoker   . Smokeless tobacco: Not on file  . Alcohol Use: No      Review of Systems  Unable to perform ROS Neurological: Positive for weakness.   Level 5 Caveat  Allergies  Review of patient's allergies indicates no known allergies.  Home Medications   Current Outpatient Rx  Name Route Sig Dispense Refill  . CIPROFLOXACIN HCL 500 MG PO TABS Oral Take 500 mg by mouth 2 (two) times daily. Patient started on 05/27/11 patient takes for 10 days     . FEXOFENADINE HCL 180 MG PO TABS Oral Take 180 mg by mouth daily.      Marland Kitchen OLMESARTAN MEDOXOMIL-HCTZ 40-25 MG PO TABS Oral Take 1 tablet by mouth daily.        BP 90/52  Pulse 108   Temp(Src) 99.5 F (37.5 C) (Rectal)  Resp 18  Ht 5\' 11"  (1.803 m)  Wt 155 lb (70.308 kg)  BMI 21.62 kg/m2  SpO2 97%  Physical Exam  Nursing note and vitals reviewed. Constitutional: He is oriented to person, place, and time. He appears well-developed and well-nourished. No distress.  HENT:  Head: Normocephalic and atraumatic.       Mouth dry, poor dentition  Eyes: EOM are normal. Pupils are equal, round, and reactive to light.  Neck: Neck supple. No tracheal deviation present.  Cardiovascular: Normal rate, regular rhythm and normal heart sounds.   Pulmonary/Chest: Effort normal and breath sounds normal. No respiratory distress.  Abdominal: Soft. He exhibits no distension.       diffusely tender  Musculoskeletal: Normal range of motion. He exhibits no edema.       Mild diffused tenderness of lower extremities  Neurological: He is alert and oriented to person, place, and time. No sensory deficit.  Skin: Skin is warm and dry.  Psychiatric: He has a normal mood and affect. His behavior is normal.    ED Course  Procedures (including critical care time) DIAGNOSTIC STUDIES: Oxygen Saturation is 100% on room air, normal by my interpretation.    COORDINATION OF CARE:  6:19 PM: EDP at bedside. Discuss sx and treatment with family.    6:23 PM: EDP phone consult  6:37 PM: EDP phone consult  Labs Reviewed  GLUCOSE, CAPILLARY - Abnormal; Notable for the following:    Glucose-Capillary >600 (*)    All other components within normal limits  URINALYSIS, ROUTINE W REFLEX MICROSCOPIC - Abnormal; Notable for the following:    APPearance CLOUDY (*)    Specific Gravity, Urine <1.005 (*)    Glucose, UA >1000 (*)    Hgb urine dipstick LARGE (*)    Ketones, ur TRACE (*)    Leukocytes, UA MODERATE (*)    All other components within normal limits  CBC - Abnormal; Notable for the following:    WBC 21.2 (*)    MCV 107.7 (*)    MCHC 29.0 (*)    Platelets 420 (*)    All other components  within normal limits  COMPREHENSIVE METABOLIC PANEL - Abnormal; Notable for the following:    Sodium 123 (*)    Potassium 6.6 (*)    Chloride 77 (*)    CO2 9 (*)    Glucose, Bld 1556 (*)    BUN 84 (*)    Creatinine, Ser 3.46 (*)    GFR calc non Af Amer 19 (*)    GFR calc Af Amer 22 (*)    All other components within normal limits  DIFFERENTIAL - Abnormal; Notable for the following:    Neutrophils Relative 93 (*)    Lymphocytes Relative 1 (*)    Neutro Abs 19.7 (*)    Lymphs Abs 0.2 (*)    Monocytes Absolute 1.3 (*)    All other components within normal limits  LACTIC ACID, PLASMA - Abnormal; Notable for the following:    Lactic Acid, Venous 4.2 (*)    All other components within normal limits  POCT I-STAT, CHEM 8 - Abnormal; Notable for the following:    Sodium 125 (*)    Potassium 6.4 (*)    BUN 85 (*)    Creatinine, Ser 3.50 (*)    Glucose, Bld >700 (*)    Calcium, Ion 0.95 (*)    All other components within normal limits  URINE MICROSCOPIC-ADD ON - Abnormal; Notable for the following:    Bacteria, UA MANY (*)    All other components within normal limits  TROPONIN I  CULTURE, BLOOD (ROUTINE X 2)  CULTURE, BLOOD (ROUTINE X 2)  INFLUENZA PANEL BY PCR  URINE CULTURE  POCT CBG MONITORING  BLOOD GAS, VENOUS   Dg Chest Port 1 View  05/29/2011  *RADIOLOGY REPORT*  Clinical Data: Increased breath sounds.  Reduced appetite. Dehydration.  PORTABLE CHEST - 1 VIEW  Comparison: None.  Findings: The patient is rotated to the left on today's exam, resulting in reduced diagnostic sensitivity and specificity. Thoracic spondylosis noted.  Cardiac and mediastinal contours appear normal.  The lungs appear clear.  No pleural effusion is identified.  IMPRESSION:  1.  Thoracic spondylosis. 2.   Otherwise, no significant abnormality identified.  Original Report Authenticated By: Dellia Cloud, M.D.     1. DKA (diabetic ketoacidoses)   2. Septic shock   3. UTI (lower urinary tract  infection)   4. Renal failure   5. Hyperkalemia    CRITICAL CARE Performed by: Billee Cashing   Total critical care time: 45   Critical care time was exclusive of separately billable procedures and treating other patients.  Critical care was necessary to treat or prevent imminent or life-threatening deterioration.  Critical care was time spent personally by me on the following activities: development of  treatment plan with patient and/or surrogate as well as nursing, discussions with consultants, evaluation of patient's response to treatment, examination of patient, obtaining history from patient or surrogate, ordering and performing treatments and interventions, ordering and review of laboratory studies, ordering and review of radiographic studies, pulse oximetry and re-evaluation of patient's condition.   Date: 05/29/2011  Rate: 125  Rhythm: sinus tachycardia  QRS Axis: right  Intervals: normal  ST/T Wave abnormalities: nonspecific T wave changes  Conduction Disutrbances:none  Narrative Interpretation: Q waves are still present. T waves appear more peaked than on the previous EKG.  Old EKG Reviewed: changes noted    MDM  Patient presents for a sick call from the nursing home. He's been reportedly treated for urinary tract infection. He also may have had flulike symptoms. His mental retardation limits his history. Patient had EMS CBG greater than 600. He was hypotensive but is improved somewhat with IV fluids. Was found to be a new onset DKA. His blood glucose is 1500. His bicarbonate is 9. He is a new renal insufficiency/failure with a creatinine of 3.5. His white count is elevated at 21. He did not have a fever here. He was found to have a urinary tract infection. He was treated with Zosyn and vancomycin. He is reportedly already on Cipro. Blood pressures have improved somewhat from 75 up into the upper 90s. His lactic acid is 4. I discussed with Dr. Sherrie Mustache from the hospitalist  service here. She feels as if he is to stick to the ICU here. He'll be transferred down to [place]. I discussed with Dr. Vassie Loll. He has had repeated fluid boluses and has been started on an insulin drip.   I personally performed the services described in this documentation, which was scribed in my presence. The recorded information has been reviewed and considered.     Juliet Rude. Rubin Payor, MD 05/29/11 1902

## 2011-05-29 NOTE — H&P (Signed)
Patient name: [name] Medical record number: [id number] Date of birth: [birth year] Age: 55 y.o. Gender: male PCP: Cassell Smiles., MD, MD  Date: 05/29/2011  Brief history 55 year-old gentleman with mental retardation and hypertension who has been ill for about a week.  Family with Flu-like illness.  Admitted with DKA/HHS with BG in 1500 range.  Lines/tubes L subclavian CVC 12/29 >>>  Culture data/sepsis markers Blood cx 12/29 pending  Antibiotics Vancomycin 12/29 >>> Zosyn 12/29 >>>  Best practice Heparin for DVT  Protocols/consults DKA protocol  Events/studies   HPI: [name] is a 55 year-old gentleman who resides in an assisted living facility with his mother, who has been weak and not eating for several days.  His sister, who is legal guardian (will provide documentation in AM) has also had flu-like illness.  He was not seen by a physician, but a Z-pak was called in for him, which did not improve things.  EMS was called, and on arrival his blood sugar was found to be markedly elevated.  He was [facility] to [facility] ED, and subsequently transferred to [facility] ICU.  On arrival blood glucose was still >600.  Past Medical History  Diagnosis Date  . MR (mental retardation)   . Hypertension   . UTI (urinary tract infection)     History reviewed. No pertinent past surgical history.  History reviewed. No pertinent family history.  Social History:  reports that he has never smoked. He does not have any smokeless tobacco history on file. He reports that he does not drink alcohol or use illicit drugs.  Allergies: No Known Allergies  Medications:  Prior to Admission medications   Medication Sig Start Date End Date Taking? Authorizing Provider  ciprofloxacin (CIPRO) 500 MG tablet Take 500 mg by mouth 2 (two) times daily. Patient started on 05/27/11 patient takes for 10 days    Yes Historical Provider, MD  fexofenadine (ALLEGRA) 180 MG tablet Take 180 mg by mouth daily.      Yes Historical Provider, MD  olmesartan-hydrochlorothiazide (BENICAR HCT) 40-25 MG per tablet Take 1 tablet by mouth daily.     Yes Historical Provider, MD    Review of systems not obtained due to patient factors.  Temp:  [99.5 F (37.5 C)] 99.5 F (37.5 C) (12/29 1604) Pulse Rate:  [106-108] 106  (12/29 1908) Resp:  [18-30] 26  (12/29 1908) BP: (75-128)/(50-97) 128/97 mmHg (12/29 1908) SpO2:  [97 %-100 %] 100 % (12/29 1908) Weight:  [70.308 kg (155 lb)] 155 lb (70.308 kg) (12/29 1551)   No intake or output data in the 24 hours ending 05/29/11 2057  Physical exam General: No apparent distress. Eyes: Anicteric sclerae. ENT: Oropharynx clear. Moist mucous membranes. No thrush Lymph: No cervical, supraclavicular, or axillary lymphadenopathy. Heart: Normal S1, S2. No murmurs, rubs, or gallops appreciated. No bruits, equal pulses. Lungs: Normal excursion, no dullness to percussion. Good air movement bilaterally, without wheezes or crackles. Normal upper airway sounds without evidence of stridor. Abdomen: Abdomen soft, non-tender and not distended, normoactive bowel sounds. No hepatosplenomegaly or masses. Musculoskeletal: No clubbing or synovitis. Skin: No rashes or lesions Neuro: No focal neurologic deficits.   radiology    LAB RESULT Lab Results  Component Value Date   CREATININE 3.50* 05/29/2011   BUN 85* 05/29/2011   NA 125* 05/29/2011   K 6.4* 05/29/2011   CL 97 05/29/2011   CO2 9* 05/29/2011   Lab Results  Component Value Date   WBC 21.2* 05/29/2011  HGB 14.3 05/29/2011   HCT 42.0 05/29/2011   MCV 107.7* 05/29/2011   PLT 420* 05/29/2011   Lab Results  Component Value Date   ALT 7 05/29/2011   AST 11 05/29/2011   ALKPHOS 74 05/29/2011   BILITOT 0.4 05/29/2011   No results found for this basename: INR, PROTIME     Assessment and Plan  Principal Problem:  *DKA (diabetic ketoacidoses) Active Problems:  Hyperkalemia  Renal failure  UTI (lower urinary  tract infection)  DKA -DKA protocol ordered.  Likely due to infection plus new onset diabetes.   -CVC placed for blood draws and fluids.  UTI -Getting vanc/zosyn.  Acute renal failure -Likely pre-renal, will observe as hydrated.  Full code, discussed with sister Dorian Heckle.    Lenzy Kerschner J 05/29/2011, 8:57 PM

## 2011-05-29 NOTE — Procedures (Signed)
Central Venous Catheter Insertion Procedure Note [name] [id number] 14-Sep-1955  Procedure: Insertion of Central Venous Catheter Indications: Drug and/or fluid administration and Frequent blood sampling  Procedure Details Consent: Risks of procedure as well as the alternatives and risks of each were explained to the (patient/caregiver).  Consent for procedure obtained. Time Out: Verified patient identification, verified procedure, site/side was marked, verified correct patient position, special equipment/implants available, medications/allergies/relevent history reviewed, required imaging and test results available.  Performed  Maximum sterile technique was used including antiseptics, cap, gloves, gown, hand hygiene, mask and sheet. Skin prep: Chlorhexidine; local anesthetic administered A antimicrobial bonded/coated triple lumen catheter was placed in the left subclavian vein using the Seldinger technique.  Evaluation Blood flow good Complications: No apparent complications Patient did tolerate procedure well. Chest X-ray ordered to verify placement.  CXR: pending.  [name] 05/29/2011, 10:04 PM

## 2011-05-29 NOTE — ED Notes (Signed)
Insulin drip started at 5.4, rn over heard family members discussing giving pt "another sip of water", remided of no fluids by mouth at this time, verbalized understanding, pt hob elevated.  Vss. Awaiting carelink

## 2011-05-30 DIAGNOSIS — I509 Heart failure, unspecified: Secondary | ICD-10-CM

## 2011-05-30 DIAGNOSIS — R7881 Bacteremia: Secondary | ICD-10-CM

## 2011-05-30 DIAGNOSIS — A419 Sepsis, unspecified organism: Secondary | ICD-10-CM

## 2011-05-30 LAB — BASIC METABOLIC PANEL
BUN: 70 mg/dL — ABNORMAL HIGH (ref 6–23)
BUN: 63 mg/dL — ABNORMAL HIGH (ref 6–23)
BUN: 55 mg/dL — ABNORMAL HIGH (ref 6–23)
CO2: 19 mEq/L (ref 19–32)
CO2: 21 mEq/L (ref 19–32)
CO2: 21 mEq/L (ref 19–32)
CO2: 22 mEq/L (ref 19–32)
Calcium: 7.7 mg/dL — ABNORMAL LOW (ref 8.4–10.5)
Calcium: 7.9 mg/dL — ABNORMAL LOW (ref 8.4–10.5)
Calcium: 7.8 mg/dL — ABNORMAL LOW (ref 8.4–10.5)
Chloride: 127 mEq/L — ABNORMAL HIGH (ref 96–112)
Chloride: 127 mEq/L — ABNORMAL HIGH (ref 96–112)
Creatinine, Ser: 2.88 mg/dL — ABNORMAL HIGH (ref 0.50–1.35)
Creatinine, Ser: 2.73 mg/dL — ABNORMAL HIGH (ref 0.50–1.35)
Creatinine, Ser: 2.51 mg/dL — ABNORMAL HIGH (ref 0.50–1.35)
Creatinine, Ser: 2.29 mg/dL — ABNORMAL HIGH (ref 0.50–1.35)
GFR calc Af Amer: 27 mL/min — ABNORMAL LOW (ref >90)
GFR calc Af Amer: 29 mL/min — ABNORMAL LOW (ref >90)
GFR calc Af Amer: 32 mL/min — ABNORMAL LOW (ref >90)
GFR calc non Af Amer: 23 mL/min — ABNORMAL LOW (ref >90)
GFR calc non Af Amer: 25 mL/min — ABNORMAL LOW (ref >90)
GFR calc non Af Amer: 30 mL/min — ABNORMAL LOW (ref >90)
Glucose, Bld: 441 mg/dL — ABNORMAL HIGH (ref 70–99)
Glucose, Bld: 193 mg/dL — ABNORMAL HIGH (ref 70–99)
Glucose, Bld: 124 mg/dL — ABNORMAL HIGH (ref 70–99)
Glucose, Bld: 131 mg/dL — ABNORMAL HIGH (ref 70–99)
Potassium: 3.2 mEq/L — ABNORMAL LOW (ref 3.5–5.1)
Sodium: 152 mEq/L — ABNORMAL HIGH (ref 135–145)
Sodium: 156 mEq/L — ABNORMAL HIGH (ref 135–145)
Sodium: 157 mEq/L — ABNORMAL HIGH (ref 135–145)
Sodium: 156 mEq/L — ABNORMAL HIGH (ref 135–145)

## 2011-05-30 LAB — GLUCOSE, CAPILLARY
Glucose-Capillary: >600 mg/dL (ref 70–99)
Glucose-Capillary: >600 mg/dL (ref 70–99)
Glucose-Capillary: 394 mg/dL — ABNORMAL HIGH (ref 70–99)
Glucose-Capillary: 360 mg/dL — ABNORMAL HIGH (ref 70–99)
Glucose-Capillary: 301 mg/dL — ABNORMAL HIGH (ref 70–99)
Glucose-Capillary: 238 mg/dL — ABNORMAL HIGH (ref 70–99)
Glucose-Capillary: 174 mg/dL — ABNORMAL HIGH (ref 70–99)
Glucose-Capillary: 161 mg/dL — ABNORMAL HIGH (ref 70–99)
Glucose-Capillary: 114 mg/dL — ABNORMAL HIGH (ref 70–99)

## 2011-05-30 LAB — URINALYSIS, ROUTINE W REFLEX MICROSCOPIC
Glucose, UA: >1000 mg/dL — AB
Ketones, ur: 40 mg/dL — AB
Protein, ur: NEGATIVE mg/dL
Urobilinogen, UA: 0.2 mg/dL (ref 0.0–1.0)

## 2011-05-30 LAB — URINE MICROSCOPIC-ADD ON

## 2011-05-30 MED ORDER — DEXTROSE 5 % IV SOLN
INTRAVENOUS | Status: DC
  Administered 2011-05-30: 17:00:00 via INTRAVENOUS

## 2011-05-30 MED ORDER — POTASSIUM CHLORIDE 10 MEQ/50ML IV SOLN
INTRAVENOUS | Status: AC
  Administered 2011-05-30: 10 meq
  Filled 2011-05-30: qty 50

## 2011-05-30 MED ORDER — WHITE PETROLATUM GEL
Status: AC
  Administered 2011-05-30: 15:00:00
  Filled 2011-05-30: qty 5

## 2011-05-30 MED ORDER — POTASSIUM CHLORIDE 10 MEQ/100ML IV SOLN
10.0000 meq | INTRAVENOUS | Status: AC
  Administered 2011-05-30 (×2): 10 meq via INTRAVENOUS
  Filled 2011-05-30 (×2): qty 100

## 2011-05-30 MED ORDER — POTASSIUM CHLORIDE 10 MEQ/100ML IV SOLN
10.0000 meq | INTRAVENOUS | Status: AC
  Administered 2011-05-30 (×4): 10 meq via INTRAVENOUS
  Filled 2011-05-30 (×4): qty 100

## 2011-05-30 NOTE — Progress Notes (Signed)
CRITICAL VALUE ALERT  Critical value received: + Blood cultures  Date of notification:  05/30/11  Time of notification:  1150  Critical value read back:yes  Nurse who received alert:  Burnard Bunting, RN  MD notified (1st page): Dr Molli Knock  Time of first page:  1151  MD notified (2nd page): n/a  Time of second page:  Responding MD: Dr Molli Knock  Time MD responded: 1155

## 2011-05-30 NOTE — Progress Notes (Signed)
CRITICAL VALUE ALERT  Critical value received:  Serum glucose 1016  Date of notification:  May 29, 2011  Time of notification:  2250  Critical value read back:yes  Nurse who received alert:  Carlyon Prows RN  MD notified (1st page):  Dr. Tula Nakayama  Time of first page:  2250  MD notified (2nd page):  Time of second page:  Responding MD:   Dr. Tula Nakayama  Time MD responded:  2250

## 2011-05-30 NOTE — Progress Notes (Signed)
eLink Physician-Brief Progress Note Patient Name: [name] DOB: [birth year] MRN: [id number]  Date of Service  05/30/2011   HPI/Events of Note  1. HYPOkalemia   eICU Interventions  1. 3 runs (elvated Cr)      Laquenta Whitsell 05/30/2011, 3:50 AM

## 2011-05-30 NOTE — Progress Notes (Signed)
CRITICAL VALUE ALERT  Critical value received:  K+ 2.6  Date of notification:  05/30/2011   Time of notification:  5:25 AM   Critical value read back:yes  Nurse who received alert:  Carlyon Prows  MD notified (1st page):  Dr. Michele Rockers  Time of first page:  5:26 AM   MD notified (2nd page):  Time of second page:  Responding MD:  Dr. Michele Rockers  Time MD responded:  5:26 AM

## 2011-05-30 NOTE — Progress Notes (Signed)
Patient name: [name] Medical record number: [id number] Date of birth: 1956-04-26 Age: 55 y.o. Gender: male PCP: Cassell Smiles., MD, MD          Kevan Ny Date: 05/30/2011  Brief history 55 year-old gentleman with mental retardation and hypertension who has been ill for about a week.  Family with Flu-like illness.  Admitted with DKA/HHS with BG in 1500 range.  Lines/tubes L subclavian CVC 12/29 >>>  Culture data/sepsis markers mrsa 12/29>>>neg Blood cx 12/29: GPC clusters>>>  Antibiotics Vancomycin 12/29 >>> Zosyn 12/29 >>>  Best practice Heparin for DVT  Protocols/consults DKA protocol  Events/studies  Temp:  [97.5 F (36.4 C)-99.5 F (37.5 C)] 98 F (36.7 C) (12/30 1143) Pulse Rate:  [85-123] 101  (12/30 1300) Resp:  [17-30] 18  (12/30 1300) BP: (72-128)/(35-97) 99/53 mmHg (12/30 1300) SpO2:  [68 %-100 %] 98 % (12/30 1300) Weight:  [74.1 kg (163 lb 5.8 oz)] 163 lb 5.8 oz (74.1 kg) (12/29 2100)      . sodium chloride 150 mL/hr at 05/29/11 2300  . sodium chloride 999 mL/hr at 05/29/11 2130  . dextrose 5 % and 0.45% NaCl 125 mL/hr at 05/30/11 0812  . insulin (NOVOLIN-R) infusion 7.6 mL/hr at 05/30/11 1500  . DISCONTD: sodium chloride    . DISCONTD: dextrose 5 % and 0.45% NaCl    . DISCONTD: insulin (NOVOLIN-R) infusion       Intake/Output Summary (Last 24 hours) at 05/30/11 1556 Last data filed at 05/30/11 1500  Gross per 24 hour  Intake 3492.9 ml  Output   1725 ml  Net 1767.9 ml    Physical exam General: No apparent distress. But has generalized parestheia Eyes: Anicteric sclerae. ENT: Oropharynx clear. Moist mucous membranes. No thrush Lymph: No cervical, supraclavicular, or axillary lymphadenopathy. Heart: Normal S1, S2. No murmurs, rubs, or gallops appreciated. No bruits, equal pulses. Lungs: Normal excursion, no dullness to percussion. Good air movement bilaterally, without wheezes or crackles. Normal upper airway sounds without evidence of  stridor. Abdomen: Abdomen soft, non-tender and not distended, normoactive bowel sounds. No hepatosplenomegaly or masses. Musculoskeletal: No clubbing or synovitis. Skin: No rashes or lesions Neuro: No focal neurologic deficits.   radiology    LAB RESULT  Lab 05/30/11 1015 05/30/11 0400 05/30/11 0240  NA 156* 152* 149*  K 3.1* 2.6* 2.8*  CL 126* 121* 116*  CO2 21 19 18*  BUN 55* 63* 67*  CREATININE 2.36* 2.51* 2.73*  GLUCOSE 193* 441* 620*    Lab 05/29/11 2247 05/29/11 1751 05/29/11 1700  HGB 12.0* 14.3 13.4  HCT 36.8* 42.0 46.2  WBC 18.0* -- 21.2*  PLT 291 -- 420*     Assessment and Plan   DKA CBG (last 3)   Basename 05/30/11 1109 05/30/11 0911 05/30/11 0803  GLUCAP 165* 254* 238*  plan -continue insulin gtt per protocol -rx infection  Anion gap acidosis in setting of lactic acidosis. Gap now closed.   Lab 05/30/11 1015 05/30/11 0400 05/30/11 0240  CO2 21 19 18*  Plan: -cont glycemic control with gtt until MS improved.   Sepsis in setting of Presumed UTI and GPC bacteremia  Lab 05/29/11 2247 05/29/11 1700  WBC 18.0* 21.2*  No results found for this basename: PROCALCITON:4 in the last 168 hours Plan: -follow up cultures -trend wbc and pct -cont current abx -Getting vanc/zosyn. -may need echo depending on culture results.   Acute renal failure: in setting of volume depletion  Recent Labs  Basename 05/30/11 1015 05/30/11 0400  05/30/11 0240   CREATININE 2.36* 2.51* 2.73*  plan: -cont IVFs -strict I&O -f/u chemistry  Hypernatremia  Lab 05/30/11 1015 05/30/11 0400 05/30/11 0240  NA 156* 152* 149*  Plan: -replace free water -close obs of Na  Hypokalemia  Lab 05/30/11 1015 05/30/11 0400 05/30/11 0240  K 3.1* 2.6* 2.8*   Plan -replace and recheck    BABCOCK,PETE 05/30/2011, 3:56 PM        Patient seen and examined, agree with above note.  I dictated the care and orders written for this patient under my direction.  Koren Bound,  M.D.

## 2011-05-30 NOTE — Progress Notes (Signed)
CRITICAL VALUE ALERT  Critical value received:   Serum Glucose 620 mg/dl  Date of notification:  05/30/2011   Time of notification:  0240  Critical value read back:yes  Nurse who received alert:  Carlyon Prows  MD notified (1st page):  Dr. Michele Rockers   Time of first page:  0240  MD notified (2nd page):  Time of second page:  Responding MD:  Dr. Michele Rockers  Time MD responded:  [phone]

## 2011-05-30 NOTE — Progress Notes (Signed)
CRITICAL VALUE ALERT  Critical value received:  Serum glucose 783 mg/dl  Date of notification:  05/30/2011  Time of notification:  0000  Critical value read back:yes  Nurse who received alert:  Carlyon Prows RN   MD notified (1st page):  Dr. Tula Nakayama  Time of first page:  0000  MD notified (2nd page):  Time of second page:  Responding MD:  Dr. Tula Nakayama  Time MD responded:  0000

## 2011-05-31 ENCOUNTER — Inpatient Hospital Stay (HOSPITAL_COMMUNITY): Payer: Medicare Other

## 2011-05-31 LAB — BASIC METABOLIC PANEL
BUN: 34 mg/dL — ABNORMAL HIGH (ref 6–23)
BUN: 23 mg/dL (ref 6–23)
CO2: 21 mEq/L (ref 19–32)
CO2: 22 mEq/L (ref 19–32)
Chloride: 122 mEq/L — ABNORMAL HIGH (ref 96–112)
Chloride: 124 mEq/L — ABNORMAL HIGH (ref 96–112)
Chloride: 125 mEq/L — ABNORMAL HIGH (ref 96–112)
GFR calc Af Amer: 41 mL/min — ABNORMAL LOW (ref >90)
GFR calc Af Amer: 55 mL/min — ABNORMAL LOW (ref >90)
GFR calc non Af Amer: 48 mL/min — ABNORMAL LOW (ref >90)
Glucose, Bld: 138 mg/dL — ABNORMAL HIGH (ref 70–99)
Glucose, Bld: 199 mg/dL — ABNORMAL HIGH (ref 70–99)
Potassium: 3 mEq/L — ABNORMAL LOW (ref 3.5–5.1)
Potassium: 3.2 mEq/L — ABNORMAL LOW (ref 3.5–5.1)
Potassium: 3.3 mEq/L — ABNORMAL LOW (ref 3.5–5.1)
Sodium: 150 mEq/L — ABNORMAL HIGH (ref 135–145)
Sodium: 152 mEq/L — ABNORMAL HIGH (ref 135–145)

## 2011-05-31 LAB — GLUCOSE, CAPILLARY
Glucose-Capillary: 118 mg/dL — ABNORMAL HIGH (ref 70–99)
Glucose-Capillary: 125 mg/dL — ABNORMAL HIGH (ref 70–99)
Glucose-Capillary: 133 mg/dL — ABNORMAL HIGH (ref 70–99)
Glucose-Capillary: 143 mg/dL — ABNORMAL HIGH (ref 70–99)
Glucose-Capillary: 156 mg/dL — ABNORMAL HIGH (ref 70–99)
Glucose-Capillary: 161 mg/dL — ABNORMAL HIGH (ref 70–99)
Glucose-Capillary: 169 mg/dL — ABNORMAL HIGH (ref 70–99)
Glucose-Capillary: 191 mg/dL — ABNORMAL HIGH (ref 70–99)
Glucose-Capillary: 199 mg/dL — ABNORMAL HIGH (ref 70–99)
Glucose-Capillary: 117 mg/dL — ABNORMAL HIGH (ref 70–99)
Glucose-Capillary: 159 mg/dL — ABNORMAL HIGH (ref 70–99)
Glucose-Capillary: 185 mg/dL — ABNORMAL HIGH (ref 70–99)
Glucose-Capillary: 209 mg/dL — ABNORMAL HIGH (ref 70–99)
Glucose-Capillary: 210 mg/dL — ABNORMAL HIGH (ref 70–99)
Glucose-Capillary: 140 mg/dL — ABNORMAL HIGH (ref 70–99)
Glucose-Capillary: 146 mg/dL — ABNORMAL HIGH (ref 70–99)

## 2011-05-31 LAB — CBC
HCT: 34 % — ABNORMAL LOW (ref 39.0–52.0)
Hemoglobin: 11.9 g/dL — ABNORMAL LOW (ref 13.0–17.0)
MCH: 31.2 pg (ref 26.0–34.0)
MCHC: 35 g/dL (ref 30.0–36.0)
MCV: 89 fL (ref 78.0–100.0)

## 2011-05-31 MED ORDER — PIPERACILLIN-TAZOBACTAM 3.375 G IVPB
3.3750 g | Freq: Three times a day (TID) | INTRAVENOUS | Status: DC
  Administered 2011-05-31 – 2011-06-01 (×3): 3.375 g via INTRAVENOUS
  Filled 2011-05-31 (×5): qty 50

## 2011-05-31 MED ORDER — PANTOPRAZOLE SODIUM 40 MG IV SOLR
40.0000 mg | INTRAVENOUS | Status: DC
  Administered 2011-05-31: 40 mg via INTRAVENOUS
  Filled 2011-05-31: qty 40

## 2011-05-31 MED ORDER — VANCOMYCIN HCL IN DEXTROSE 1-5 GM/200ML-% IV SOLN
1000.0000 mg | INTRAVENOUS | Status: DC
  Administered 2011-05-31: 1000 mg via INTRAVENOUS
  Filled 2011-05-31 (×2): qty 200

## 2011-05-31 MED ORDER — PNEUMOCOCCAL VAC POLYVALENT 25 MCG/0.5ML IJ INJ
0.5000 mL | INJECTION | INTRAMUSCULAR | Status: AC
  Administered 2011-06-01: 0.5 mL via INTRAMUSCULAR
  Filled 2011-05-31: qty 0.5

## 2011-05-31 MED ORDER — POTASSIUM CHLORIDE 10 MEQ/50ML IV SOLN
10.0000 meq | INTRAVENOUS | Status: AC
  Administered 2011-05-31 (×4): 10 meq via INTRAVENOUS
  Filled 2011-05-31 (×4): qty 50

## 2011-05-31 MED ORDER — DEXTROSE 5 % IV SOLN
INTRAVENOUS | Status: DC
  Administered 2011-05-31 – 2011-06-01 (×3): via INTRAVENOUS

## 2011-05-31 MED ORDER — POTASSIUM CHLORIDE 10 MEQ/50ML IV SOLN
INTRAVENOUS | Status: AC
  Administered 2011-05-31: 10 meq via INTRAVENOUS
  Filled 2011-05-31: qty 50

## 2011-05-31 MED ORDER — SODIUM CHLORIDE 0.9 % IV SOLN
INTRAVENOUS | Status: DC
  Administered 2011-06-01: 6.7 [IU]/h via INTRAVENOUS
  Filled 2011-05-31 (×2): qty 1

## 2011-05-31 MED ORDER — LIVING WELL WITH DIABETES BOOK
Freq: Once | Status: AC
  Administered 2011-06-01
  Filled 2011-05-31: qty 1

## 2011-05-31 NOTE — Progress Notes (Signed)
Speech Language Pathology Clinical/Bedside Swallow Evaluation Patient Details  Name: [name] MRN: [id number] DOB: [birth year] Today's Date: 05/31/2011  Past Medical History:  Past Medical History  Diagnosis Date  . MR (mental retardation)   . Hypertension   . UTI (urinary tract infection)    HPI: 55 y.o. male (with MR) admitted 05/30/11 with flu-like symptoms for 1 week, chest x-ray shows right sided atelectasis today and MD reports hyponatremia.  Swallow evaluation ordered due to RN reports of overt coughing with thin liquids this morning.   Assessment/Recommendations/Treatment Plan Clinical Impression Statement: Demonstrates a moderate-severe oral phase and suspected pharyngeal phase dysphagia based on skilled observation of PO trials.  Oral phase marked by inefficient lingual manipulation and poor mastication ability with solid textures, resulting in majority of solid bolus residing in oral cavity as well as patient spitting it out.  Patient also consistently parted lips upon swallow, losing intra-oral pressures for the most efficient swallow.  This behavior is presumed to be long-standing and consistent with his MR diagnosis.  The most significant finding was consistent and suspected gross aspiration with this liquid trials.  Patient's guardian (his sister) reports this has been longstanding, stating "he has coughed like this for years" and further adding that his nose runs and he coughs to the point ot congestion.  Family denies previous pulmonary issues in prior years.  Lengthy discussion with patient's sister who verbalized her understanding of aspiration pna risks, yet would like to continue solid textures and thin liquids as his quality of life is limited as it is.  Suggest physician further discuss these risks as they are very likely in his future given the suspected severity of his aspiration. Risk for Aspiration: Severe Other Related Risk Factors: Cognitive impairment;Decreased  respiratory status  Recommendations 1.  Regular & Thin liquids (with known aspiration risk) 2.  Whole meds with liquid Supervision: Patient able to self feed;Full supervision/cueing for compensatory strategies Compensations: Small sips/bites;Follow solids with liquid;Hard cough after swallow Postural Changes and/or Swallow Maneuvers: Seated upright 90 degrees;Upright 30-60 min after meal Oral Care Recommendations: Oral care BID Follow up Recommendations: None  Treatment Plan Treatment Plan Recommendations: No treatment recommended at this time  Individuals Consulted Consulted and Agree with Results and Recommendations: Family member/caregiver  General  Date of Onset: 05/30/11 Type of Study: Bedside swallow evaluation Diet Prior to this Study: Thin liquids;Other (Comment) (Clear liquids) Temperature Spikes Noted: No Respiratory Status: Room air History of Intubation: No Behavior/Cognition: Confused;Agitated;Distractible (Baseline MR) Oral Cavity - Dentition: Poor condition;Adequate natural dentition Vision: Functional for self-feeding Patient Positioning: Upright in bed Baseline Vocal Quality: Normal Volitional Cough: Strong Volitional Swallow: Able to elicit Ice chips: Not tested  Oral Motor/Sensory Function  Overall Oral Motor/Sensory Function: Appears within functional limits for tasks assessed Labial ROM: Within Functional Limits Labial Symmetry: Within Functional Limits Lingual ROM: Within Functional Limits Lingual Symmetry: Within Functional Limits Lingual Strength: Within Functional Limits Facial ROM: Within Functional Limits Facial Symmetry: Within Functional Limits Mandible: Within Functional Limits  Consistency Results  Ice Chips Ice chips: Not tested  Thin Liquid Thin Liquid: Impaired Presentation: Cup;Self Fed;Straw Oral Phase Impairments: Reduced labial seal;Reduced lingual movement/coordination (Unsealing of lips upon swallow) Pharyngeal  Phase  Impairments: Throat Clearing - Immediate;Cough - Immediate  Nectar Thick Liquid Nectar Thick Liquid: Not tested  Honey Thick Liquid Honey Thick Liquid: Not tested  Puree Puree: Impaired Presentation: Spoon;Self Fed Oral Phase Impairments: Reduced labial seal;Reduced lingual movement/coordination Oral Phase Functional Implications: Oral residue Pharyngeal Phase Impairments: Throat  Clearing - Immediate;Cough - Immediate  Solid Solid: Impaired Presentation: Self Fed Oral Phase Impairments: Reduced labial seal (Inefficient mastication) Oral Phase Functional Implications: Oral holding;Oral residue;Left lateral sulci pocketing;Right lateral sulci pocketing;Left anterior spillage;Right anterior spillage Pharyngeal Phase Impairments: Throat Clearing - Immediate;Cough - Immediate   Myra Rude, M.S.,CCC-SLP Pager [phone] 05/31/2011,1:54 PM

## 2011-05-31 NOTE — Progress Notes (Signed)
Noted pt admitted with lab glucose of 1556 mg/dl and elevated anion gap.  Started on IV fluids and IV insulin drip.  Remains on IV insulin drip at present.  Anion gap improved.  Pt has new onset diabetes per H&P note from CCM.  RNs to begin basic DM education at bedside as appropriate.  Noted pt has history of mental retardation and lives in ALF.  Have ordered DM booklet, videos, DM pamphlets, and RD consult as appropriate for pt and family members.    Pt will likely need basal insulin before insulin drip discontinued.  Could start with 0.2 units/kg dosing, which would be approximately 15 units Lantus, 1-2 hours before insulin drip stopped.  Will follow.

## 2011-05-31 NOTE — Plan of Care (Signed)
Problem: Food- and Nutrition-Related Knowledge Deficit (NB-1.1) Goal: Nutrition education Formal process to instruct or train a patient/client in a skill or to impart knowledge to help patients/clients voluntarily manage or modify food choices and eating behavior to maintain or improve health.  Outcome: Completed/Met Date Met:  05/31/11 RD spoke with sister regarding DM diet. Provided CHO Counting and balanced plate handout. Provided verbalized understanding. Pt with limited dietary choices at ALF. Goals met.

## 2011-05-31 NOTE — Progress Notes (Signed)
Patient name: [name] Medical record number: [id number] Date of birth: Feb 13, 1956 Age: 55 y.o. Gender: male PCP: Cassell Smiles., MD, MD          Kevan Ny Date: 05/31/2011  Brief history 55 year-old gentleman with mental retardation and hypertension who has been ill for about a week.  Family with Flu-like illness.  Admitted with DKA/HHS with BG in 1500 range.  Lines/tubes L subclavian CVC 12/29 >>>  Culture data/sepsis markers mrsa 12/29>>>neg Blood cx 12/29: 2/2 GPC clusters>>>  Antibiotics Vancomycin 12/29 >>> Zosyn 12/29 >>>  Best practice Heparin for DVT  Protocols/consults DKA protocol off 12/31 ICu insulin drip 12/31>>>  Events/studies  No gap, insulin drip needs 8   Temp:  [97.8 F (36.6 C)-99 F (37.2 C)] 98.1 F (36.7 C) (12/31 0800) Pulse Rate:  [40-109] 109  (12/31 0800) Resp:  [14-26] 14  (12/31 1000) BP: (97-137)/(53-104) 137/88 mmHg (12/31 1000) SpO2:  [89 %-100 %] 98 % (12/31 1000)      . dextrose 80 mL/hr at 05/30/11 1700  . insulin (NOVOLIN-R) infusion 8.5 mL/hr at 05/31/11 1000  . DISCONTD: sodium chloride 150 mL/hr at 05/29/11 2300  . DISCONTD: dextrose 5 % and 0.45% NaCl 125 mL/hr at 05/30/11 1622     Intake/Output Summary (Last 24 hours) at 05/31/11 1040 Last data filed at 05/31/11 1000  Gross per 24 hour  Intake 2901.5 ml  Output   1465 ml  Net 1436.5 ml    Physical exam General: No apparent distress. But has generalized parestheia Eyes: Anicteric sclerae. ENT: poor dentition Lymph: No cervical, supraclavicular, or axillary lymphadenopathy. Heart: Normal S1, S2. No murmurs Lungs:CTA Abdomen: Abdomen soft, non-tender and not distended, normoactive bowel sounds. No hepatosplenomegaly or masses. Musculoskeletal: No clubbing or synovitis. Skin: No rashes or lesions Neuro: No focal neurologic deficits.   radiology    LAB RESULT  Lab 05/31/11 0400 05/31/11 05/30/11 2000  NA 154* 152* 156*  K 3.2* 3.3* 3.5  CL 125*  124* 127*  CO2 22 21 22   BUN 34* 37* 43*  CREATININE 2.02* 2.11* 2.21*  GLUCOSE 138* 199* 131*    Lab 05/31/11 0400 05/29/11 2247 05/29/11 1751 05/29/11 1700  HGB 11.9* 12.0* 14.3 --  HCT 34.0* 36.8* 42.0 --  WBC 14.9* 18.0* -- 21.2*  PLT 155 291 -- 420*     Assessment and Plan   DKA resolved, no GAP CBG (last 3)   Basename 05/31/11 0807 05/31/11 0719 05/31/11 0556  GLUCAP 191* 169* 150*  plan -continue insulin gtt per protocol but change to glucose stab and dc dka prot (done) -rx infection  Anion gap acidosis in setting of lactic acidosis. Gap now closed.  Hypernatremia   Lab 05/31/11 0400 05/31/11 05/30/11 2000  CO2 22 21 22   Plan: -cont glycemic control with gtt until MS improved.  Chem in am  Encourage free water Change d5w to 100 If able to eat then to 1/2 NS  Sepsis in setting of Presumed UTI and GPC bacteremia  Lab 05/31/11 0400 05/29/11 2247 05/29/11 1700  WBC 14.9* 18.0* 21.2*  No results found for this basename: PROCALCITON:4 in the last 168 hours Plan: -cont current abx and follow ID of GPC -Getting vanc/zosyn, maintain -may need echo depending on culture results.  Assess renal US for perinephric abscess  Dc tamiflu  Acute renal failure: in setting of volume depletion  Recent Labs  [facility] 05/31/11 0400 05/31/11 05/30/11 2000   CREATININE 2.02* 2.11* 2.21*  plan: -cont IVFs to  d5w Renal US see above -strict I&O -f/u chemistry at 5 pm  For Na  Hypernatremia  Lab 05/31/11 0400 05/31/11 05/30/11 2000  NA 154* 152* 156*  Plan: -increase d5w -na in pm   Hypokalemia  Lab 05/31/11 0400 05/31/11 05/30/11 2000  K 3.2* 3.3* 3.5   Plan -replace and recheck in pm   Mcarthur Rossetti. Tyson Alias, MD, FACP Pgr: [phone] [facility]   Nelda Bucks. 05/31/2011, 10:40 AM

## 2011-05-31 NOTE — Significant Event (Signed)
CRITICAL VALUE ALERT  Critical value received: blood culture result update  Date of notification:  05/31/11  Time of notification: 1220  Critical value read back: no organism seen; previously reported as gram +cocci in clusters from [facility]  Nurse who received alert:  Burnard Bunting, RN  MD notified (1st page):Dr Tyson Alias (on unit)   Time of first page: n/a  MD notified (2nd page):  Time of second page:  Responding MD:Dr Tyson Alias  Time MD responded: 1221

## 2011-05-31 NOTE — Progress Notes (Signed)
ANTIBIOTIC CONSULT NOTE - INITIAL  Pharmacy Consult for Vancomycin & Zosyn Indication: Empiric UTI  No Known Allergies  Assessment: 55 yo M admitted from [facility] with DKA, septic shock  Pharmacist System-Based Medication Review: Anticoagulation: DVT Prophylaxsis, SQ heparin, follow CBC Infectious Disease: Septic shock, empiric UTI, tamiflu 75 daily-flu negative recommend d/c, D#2 vancomycin and zosyn, Bldcx-GPC clusters--going to repeat, unsure of source, WBC 14.9, Afebrile, concern for  Endocrinology: No DKA per MD: changing to ICU Part 2-Insulin drip. On D5W at 122ml/hr.  Gastrointestinal / Nutrition: Hypokalemia-- repleting.  Neurology: mental retardation Nephrology: ARF-SCr 2.02 improving, estCrCl~43, UOP~0.8cc/kg/hr PTA Medication Issues: Not ordered: olmesartan HCT, allegra Best Practices: SQ heparin, follow up for GI prophylaxsis  Goal of Therapy:  Vancomycin trough level 15-20 mcg/ml, renal adjustment of medications  Plan:  1. Adjusted Zosyn to 3.375g IV q8h as Scr improving 2. Adjust Vancomycin to 1g IV q24h 3. Follow up SCr, UOP, cultures, clinical course and adjust as clinically indicated.  Patient Measurements: Height: 5\' 11"  (180.3 cm) Weight: 163 lb 5.8 oz (74.1 kg) IBW/kg (Calculated) : 75.3   Vital Signs: Temp: 98.1 F (36.7 C) (12/31 0800) Temp src: Oral (12/31 0800) BP: 137/88 mmHg (12/31 1000) Pulse Rate: 109  (12/31 0800) Intake/Output from previous day: 12/30 0701 - 12/31 0700 In: 3051.4 [I.V.:2501.4; IV Piggyback:550] Out: 1390 [Urine:1390] Intake/Output from this shift: Total I/O In: 411.2 [I.V.:261.2; IV Piggyback:150] Out: 400 [Urine:400]  Labs:  [facility] 05/31/11 0400 05/31/11 05/30/11 2000 05/30/11 0008 05/29/11 2247 05/29/11 1751 05/29/11 1700  WBC 14.9* -- -- -- 18.0* -- 21.2*  HGB 11.9* -- -- -- 12.0* 14.3 --  PLT 155 -- -- -- 291 -- 420*  LABCREA -- -- -- 21.62 -- -- --  CREATININE 2.02* 2.11* 2.21* -- -- -- --    Estimated Creatinine Clearance: 43.8 ml/min (by C-G formula based on Cr of 2.02). No results found for this basename: VANCOTROUGH:2,VANCOPEAK:2,VANCORANDOM:2,GENTTROUGH:2,GENTPEAK:2,GENTRANDOM:2,TOBRATROUGH:2,TOBRAPEAK:2,TOBRARND:2,AMIKACINPEAK:2,AMIKACINTROU:2,AMIKACIN:2, in the last 72 hours   Microbiology: Recent Results (from the past 720 hour(s))  CULTURE, BLOOD (ROUTINE X 2)     Status: Normal (Preliminary result)   Collection Time   05/29/11  5:30 PM      Component Value Range Status Comment   Specimen Description BLOOD FEMORAL ARTERY   Final    Special Requests     Final    Value: BOTTLES DRAWN AEROBIC AND ANAEROBIC 6CC DRAWN BY RN   Culture     Final    Value: GRAM POSITIVE COCCI IN CLUSTERS     Gram Stain Report Called to,Read Back By and Verified With: KELLER K. AT [facility] ON [id number] AT 1210P BY THOMPSON S.   Report Status PENDING   Incomplete   MRSA PCR SCREENING     Status: Normal   Collection Time   05/29/11  9:23 PM      Component Value Range Status Comment   MRSA by PCR NEGATIVE  NEGATIVE  Final     Medical History: Past Medical History  Diagnosis Date  . MR (mental retardation)   . Hypertension   . UTI (urinary tract infection)     Medications:  Prescriptions prior to admission  Medication Sig Dispense Refill  . fexofenadine (ALLEGRA) 180 MG tablet Take 180 mg by mouth daily.        Marland Kitchen olmesartan-hydrochlorothiazide (BENICAR HCT) 40-25 MG per tablet Take 1 tablet by mouth daily.        Marland Kitchen DISCONTD: ciprofloxacin (CIPRO) 500 MG tablet Take 500 mg by mouth  2 (two) times daily. Patient started on 05/27/11 patient takes for 10 days        Fayne Norrie 05/31/2011,10:44 AM

## 2011-06-01 DIAGNOSIS — R7881 Bacteremia: Secondary | ICD-10-CM

## 2011-06-01 DIAGNOSIS — I509 Heart failure, unspecified: Secondary | ICD-10-CM

## 2011-06-01 DIAGNOSIS — A419 Sepsis, unspecified organism: Secondary | ICD-10-CM

## 2011-06-01 LAB — GLUCOSE, CAPILLARY
Glucose-Capillary: 103 mg/dL — ABNORMAL HIGH (ref 70–99)
Glucose-Capillary: 138 mg/dL — ABNORMAL HIGH (ref 70–99)
Glucose-Capillary: 138 mg/dL — ABNORMAL HIGH (ref 70–99)
Glucose-Capillary: 148 mg/dL — ABNORMAL HIGH (ref 70–99)
Glucose-Capillary: 148 mg/dL — ABNORMAL HIGH (ref 70–99)
Glucose-Capillary: 154 mg/dL — ABNORMAL HIGH (ref 70–99)
Glucose-Capillary: 161 mg/dL — ABNORMAL HIGH (ref 70–99)
Glucose-Capillary: 172 mg/dL — ABNORMAL HIGH (ref 70–99)
Glucose-Capillary: 356 mg/dL — ABNORMAL HIGH (ref 70–99)

## 2011-06-01 LAB — DIFFERENTIAL
Basophils Relative: 0 % (ref 0–1)
Eosinophils Absolute: 0 10*3/uL (ref 0.0–0.7)
Eosinophils Relative: 0 % (ref 0–5)
Neutrophils Relative %: 85 % — ABNORMAL HIGH (ref 43–77)

## 2011-06-01 LAB — CBC
MCH: 30.8 pg (ref 26.0–34.0)
MCHC: 34.5 g/dL (ref 30.0–36.0)
MCV: 89.2 fL (ref 78.0–100.0)
Platelets: 122 10*3/uL — ABNORMAL LOW (ref 150–400)

## 2011-06-01 LAB — BASIC METABOLIC PANEL
BUN: 18 mg/dL (ref 6–23)
CO2: 21 mEq/L (ref 19–32)
Calcium: 7.4 mg/dL — ABNORMAL LOW (ref 8.4–10.5)
Creatinine, Ser: 1.55 mg/dL — ABNORMAL HIGH (ref 0.50–1.35)
GFR calc non Af Amer: 49 mL/min — ABNORMAL LOW (ref >90)
Glucose, Bld: 199 mg/dL — ABNORMAL HIGH (ref 70–99)
Sodium: 148 mEq/L — ABNORMAL HIGH (ref 135–145)

## 2011-06-01 LAB — URINE CULTURE: Colony Count: 70000

## 2011-06-01 MED ORDER — INSULIN ASPART 100 UNIT/ML ~~LOC~~ SOLN
0.0000 [IU] | Freq: Three times a day (TID) | SUBCUTANEOUS | Status: DC
  Administered 2011-06-01: 2 [IU] via SUBCUTANEOUS
  Administered 2011-06-01 – 2011-06-02 (×2): 15 [IU] via SUBCUTANEOUS
  Filled 2011-06-01 (×3): qty 3

## 2011-06-01 MED ORDER — DEXTROSE 5 % IV SOLN
1.0000 g | INTRAVENOUS | Status: DC
  Administered 2011-06-01 – 2011-06-05 (×5): 1 g via INTRAVENOUS
  Filled 2011-06-01 (×6): qty 10

## 2011-06-01 MED ORDER — INSULIN GLARGINE 100 UNIT/ML ~~LOC~~ SOLN
5.0000 [IU] | Freq: Two times a day (BID) | SUBCUTANEOUS | Status: DC
  Administered 2011-06-01: 5 [IU] via SUBCUTANEOUS
  Filled 2011-06-01 (×3): qty 3

## 2011-06-01 MED ORDER — INSULIN ASPART 100 UNIT/ML ~~LOC~~ SOLN: 0.0000 [IU] | SUBCUTANEOUS | Status: DC

## 2011-06-01 MED ORDER — SODIUM CHLORIDE 0.45 % IV SOLN
INTRAVENOUS | Status: DC
  Administered 2011-06-01 – 2011-06-02 (×2): via INTRAVENOUS

## 2011-06-01 NOTE — Progress Notes (Signed)
Patient name: [name] Medical record number: [id number] Date of birth: [birth year] Age: 56 y.o. Gender: male PCP: Cassell Smiles., MD, MD          Kevan Ny Date: 06/01/2011  Brief history 56 year-old gentleman with mental retardation and hypertension who has been ill for about a week.  Family with Flu-like illness.  Admitted with DKA/HHS with BG in 1500 range.  Lines/tubes L subclavian CVC 12/29 >>>  Culture data/sepsis markers mrsa 12/29>>>neg Blood cx 12/29: 2/2 GPC clusters>>>  Antibiotics Vancomycin 12/29 >>> Zosyn 12/29 >>>  Best practice Heparin for DVT  Protocols/consults DKA protocol off 12/31 ICu insulin drip 12/31>>>  Events/studies Renal US 12/31 - Small right renal cyst. Nonobstructing left renal calculus. No<BR>hydronephrosis. No perinephric fluid collection Insulin drip remains Asp on slp, may be a normal pattern   Temp:  [97.6 F (36.4 C)-98.6 F (37 C)] 97.6 F (36.4 C) (01/01 0744) Pulse Rate:  [187] 187  (01/01 0800) Resp:  [16-25] 21  (01/01 1000) BP: (106-157)/(54-107) 106/54 mmHg (01/01 1000) SpO2:  [97 %-100 %] 98 % (01/01 0800) Weight:  [75.6 kg (166 lb 10.7 oz)] 166 lb 10.7 oz (75.6 kg) (01/01 0200)      . dextrose 100 mL/hr at 06/01/11 1039  . insulin (NOVOLIN-R) infusion 4.3 mL/hr at 06/01/11 1000     Intake/Output Summary (Last 24 hours) at 06/01/11 1142 Last data filed at 06/01/11 1000  Gross per 24 hour  Intake 2990.21 ml  Output   2850 ml  Net 140.21 ml    Physical exam General: in chair appears well Eyes: Anicteric sclerae. ENT: poor dentition Lymph: No cervical, supraclavicular, or axillary lymphadenopathy. Heart: Normal S1, S2. No murmurs Lungs:CTA Abdomen: Abdomen soft, non-tender and not distended, normoactive bowel sounds. No hepatosplenomegaly or masses. Musculoskeletal: No clubbing or synovitis. Skin: No rashes or lesions Neuro: No focal neurologic deficits.   radiology    LAB RESULT  Lab 06/01/11 0200  06/01/11 0152 05/31/11 1705  NA 148* QUESTIONABLE RESULTS, RECOMMEND RECOLLECT TO VERIFY 150*  K 3.6 QUESTIONABLE RESULTS, RECOMMEND RECOLLECT TO VERIFY 3.0*  CL 120* QUESTIONABLE RESULTS, RECOMMEND RECOLLECT TO VERIFY 122*  CO2 21 QUESTIONABLE RESULTS, RECOMMEND RECOLLECT TO VERIFY 19  BUN 18 QUESTIONABLE RESULTS, RECOMMEND RECOLLECT TO VERIFY 23  CREATININE 1.55* QUESTIONABLE RESULTS, RECOMMEND RECOLLECT TO VERIFY 1.59*  GLUCOSE 199* QUESTIONABLE RESULTS, RECOMMEND RECOLLECT TO VERIFY 194*    Lab 06/01/11 0200 05/31/11 0400 05/29/11 2247  HGB 11.4* 11.9* 12.0*  HCT 33.0* 34.0* 36.8*  WBC 14.5* 14.9* 18.0*  PLT 122* 155 291     Assessment and Plan   DKA resolved, no GAP CBG (last 3)   Basename 06/01/11 1013 06/01/11 0905 06/01/11 0754  GLUCAP 168* 138* 148*  plan -continue insulin gtt per but plan to transition off now Add lantus  Add ssi moderate  Anion gap acidosis in setting of lactic acidosis. Gap now closed.  Hypernatremia   Lab 06/01/11 0200 06/01/11 0152 05/31/11 1705  CO2 21 QUESTIONABLE RESULTS, RECOMMEND RECOLLECT TO VERIFY 19  Plan: -labs reviewed Reduce and change fluid as diet startes Dc d5 in fluids and change to 1/2 NS as na corrected  Sepsis in setting of Presumed UTI and GPC bacteremia  Lab 06/01/11 0200 05/31/11 0400 05/29/11 2247  WBC 14.5* 14.9* 18.0*  No results found for this basename: PROCALCITON:4 in the last 168 hours Plan: -off pressors See ID  Acute renal failure: in setting of volume depletion  Recent Labs  Basename 06/01/11 0200  06/01/11 0152 05/31/11 1705   CREATININE 1.55* QUESTIONABLE RESULTS, RECOMMEND RECOLLECT TO VERIFY 1.59*  plan: -cont IVFs to 12 NS Chem in am  Na responding well  Hypernatremia  Lab 06/01/11 0200 06/01/11 0152 05/31/11 1705  NA 148* QUESTIONABLE RESULTS, RECOMMEND RECOLLECT TO VERIFY 150*  Plan: -to 1/2 NS Chem in am   Hypokalemia  Lab 06/01/11 0200 06/01/11 0152 05/31/11 1705  K 3.6  QUESTIONABLE RESULTS, RECOMMEND RECOLLECT TO VERIFY 3.0*   Plan -replace and recheck in am  Morganella in urine Stones wihtour hydro Needs outpt urology folow up Narrow to ceftraixone, plan 10 days abx  To floor  Mcarthur Rossetti. Tyson Alias, MD, FACP Pgr: [phone] [facility]   Nelda Bucks. 06/01/2011, 11:42 AM

## 2011-06-01 NOTE — Progress Notes (Signed)
Special diet required for safety I have described pt needs. Family wishes to feed and understands risk death from asp   Mcarthur Rossetti. Tyson Alias, MD, FACP Pgr: [phone] [facility]

## 2011-06-02 LAB — BASIC METABOLIC PANEL
CO2: 16 mEq/L — ABNORMAL LOW (ref 19–32)
Calcium: 8.4 mg/dL (ref 8.4–10.5)
Chloride: 113 mEq/L — ABNORMAL HIGH (ref 96–112)
Chloride: 121 mEq/L — ABNORMAL HIGH (ref 96–112)
Creatinine, Ser: 1.48 mg/dL — ABNORMAL HIGH (ref 0.50–1.35)
GFR calc Af Amer: 60 mL/min — ABNORMAL LOW (ref >90)
GFR calc Af Amer: 68 mL/min — ABNORMAL LOW (ref >90)
GFR calc Af Amer: 77 mL/min — ABNORMAL LOW (ref >90)
GFR calc non Af Amer: 52 mL/min — ABNORMAL LOW (ref >90)
Potassium: 4.3 mEq/L (ref 3.5–5.1)
Sodium: 144 mEq/L (ref 135–145)
Sodium: 146 mEq/L — ABNORMAL HIGH (ref 135–145)

## 2011-06-02 LAB — GLUCOSE, CAPILLARY
Glucose-Capillary: 367 mg/dL — ABNORMAL HIGH (ref 70–99)
Glucose-Capillary: 481 mg/dL — ABNORMAL HIGH (ref 70–99)
Glucose-Capillary: 331 mg/dL — ABNORMAL HIGH (ref 70–99)
Glucose-Capillary: 324 mg/dL — ABNORMAL HIGH (ref 70–99)
Glucose-Capillary: 286 mg/dL — ABNORMAL HIGH (ref 70–99)
Glucose-Capillary: 203 mg/dL — ABNORMAL HIGH (ref 70–99)
Glucose-Capillary: 455 mg/dL — ABNORMAL HIGH (ref 70–99)
Glucose-Capillary: 54 mg/dL — ABNORMAL LOW (ref 70–99)

## 2011-06-02 LAB — URINE CULTURE
Colony Count: 80000
Culture  Setup Time: 201212301943

## 2011-06-02 LAB — MAGNESIUM: Magnesium: 2.5 mg/dL (ref 1.5–2.5)

## 2011-06-02 MED ORDER — POTASSIUM CHLORIDE 10 MEQ/100ML IV SOLN
10.0000 meq | INTRAVENOUS | Status: AC
  Administered 2011-06-02 (×2): 10 meq via INTRAVENOUS
  Filled 2011-06-02 (×2): qty 100

## 2011-06-02 MED ORDER — DEXTROSE 50 % IV SOLN
25.0000 mL | INTRAVENOUS | Status: DC | PRN
  Administered 2011-06-02: 18 mL via INTRAVENOUS
  Filled 2011-06-02: qty 50

## 2011-06-02 MED ORDER — INSULIN REGULAR HUMAN 100 UNIT/ML IJ SOLN
INTRAMUSCULAR | Status: DC
  Administered 2011-06-02: 6.6 [IU]/h via INTRAVENOUS
  Administered 2011-06-02: 2.3 [IU]/h via INTRAVENOUS
  Administered 2011-06-02: 8 [IU]/h via INTRAVENOUS
  Administered 2011-06-02: 5.4 [IU]/h via INTRAVENOUS
  Administered 2011-06-02: 4.2 [IU]/h via INTRAVENOUS
  Administered 2011-06-02: 8.2 [IU]/h via INTRAVENOUS
  Administered 2011-06-03: 18:00:00 via INTRAVENOUS
  Administered 2011-06-03: 12.8 [IU]/h via INTRAVENOUS
  Filled 2011-06-02 (×4): qty 1

## 2011-06-02 MED ORDER — DEXTROSE-NACL 5-0.45 % IV SOLN
INTRAVENOUS | Status: DC
  Administered 2011-06-03: via INTRAVENOUS

## 2011-06-02 MED ORDER — HEPARIN SODIUM (PORCINE) 5000 UNIT/ML IJ SOLN
5000.0000 [IU] | Freq: Three times a day (TID) | INTRAMUSCULAR | Status: DC
  Administered 2011-06-02 – 2011-06-07 (×15): 5000 [IU] via SUBCUTANEOUS
  Filled 2011-06-02 (×19): qty 1

## 2011-06-02 MED ORDER — SODIUM CHLORIDE 0.9 % IV SOLN
INTRAVENOUS | Status: DC
  Administered 2011-06-02: 14:00:00 via INTRAVENOUS

## 2011-06-02 MED ORDER — INSULIN GLARGINE 100 UNIT/ML ~~LOC~~ SOLN
10.0000 [IU] | Freq: Two times a day (BID) | SUBCUTANEOUS | Status: DC
  Administered 2011-06-02: 10 [IU] via SUBCUTANEOUS

## 2011-06-02 MED ORDER — SODIUM CHLORIDE 0.9 % IV SOLN
INTRAVENOUS | Status: AC
  Administered 2011-06-02: 13:00:00 via INTRAVENOUS

## 2011-06-02 NOTE — Progress Notes (Signed)
Inpatient Diabetes Program Recommendations  AACE/ADA: New Consensus Statement on Inpatient Glycemic Control (2009)  Target Ranges:  Prepandial:   less than 140 mg/dL      Peak postprandial:   less than 180 mg/dL (1-2 hours)      Critically ill patients:  140 - 180 mg/dL   Reason: Anion Gap = 18 this A.M. With lab glucose 408.  Insulin gtt re-started.   Inpatient Diabetes Program Recommendations Insulin - IV drip/GlucoStabilizer: CBGs 367, 477 gtt ordered HgbA1C: Please order A1C to assess glycemic condition prior to admission  Inpatient Diabetes team will continue to follow for recommendations at gtt discontinuation.

## 2011-06-02 NOTE — Progress Notes (Signed)
Pt's noon cbg was 477.  Dr. Delton Coombes was notified, orders received.  He wanted insulin drip/glucostabilizer re started and the pt transferred to stepdown for closer observation.  Pt has had very poor appetite today, he ate about 50% of dinner. Nsg to continue to monitor.

## 2011-06-02 NOTE — Progress Notes (Addendum)
Clinical Social Worker completed psychosocial assessment, please see shadow chart.  Plan is for pt to return to ALF, ([facility] ph #: [phone], fax # [phone]).  CSW confirmed this with ALF.  CSW to continue to follow and assist as needed.  Angelia Mould, MSW, [place] [phone]

## 2011-06-02 NOTE — Clinical Documentation Improvement (Signed)
POA DOCUMENTATION CLARIFICATION QUERY  THIS DOCUMENT IS NOT A PERMANENT PART OF THE MEDICAL RECORD   Please update your documentation within the medical record to reflect your response to this query.                                                                                     06/02/11  Danford Bad, NP  In a better effort to capture your patient's severity of illness, reflect appropriate length of stay and utilization of resources, a review of the patient medical record has revealed the following indicators.    Based on your clinical judgment, please document in the progress notes and discharge summary if the diagnosis of "Sepsis in setting of Presumed UTI and GPC bacteremia" was:  - Present on Admission  - Not Present on Admission  - Unable to Clinically Determine if Present on Admission    Clinical Information:   Culture data/sepsis markers mrsa 12/29>>>neg Blood cx 12/29: GPC clusters>>>  Sepsis in setting of Presumed UTI and GPC bacteremia  Lab 05/29/11 2247 05/29/11 1700  WBC 18.0* 21.2*   No results found for this basename: PROCALCITON:4 in the last 168 hours Plan: -follow up cultures -trend wbc and pct -cont current abx -Getting vanc/zosyn. -may need echo depending on culture results." BABCOCK,PETE 05/30/2011, 3:56 PM    In responding to this query please exercise your independent judgment.  The fact that a query is asked, does not imply that any particular answer is desired or expected.   Reviewed:  No documented response ever found in pn or dcs.  Mathis Dad RN 06/11/11.      Thank You,  Jerral Ralph  RN BSN Certified Clinical Documentation Specialist: Cell   [phone]  Health Information Management [place]   TO RESPOND TO THE THIS QUERY, FOLLOW THE INSTRUCTIONS BELOW:  1. If needed, update documentation for the patient's encounter via the notes activity.  2. Access this query again and click edit on the In [employer].   3. After updating, or not, click F2 to complete all highlighted (required) fields concerning your review. Select "additional documentation in the medical record" OR "no additional documentation provided".  4. Click Sign note button.  5. The deficiency will fall out of your In Basket *Please let us know if you are not able to complete this workflow by phone or e-mail (listed below).

## 2011-06-02 NOTE — Progress Notes (Signed)
Patient is a daily weight.  His weight was taken at 1530 on 06/01/11.  He weighed 170.2 lbs.

## 2011-06-02 NOTE — Progress Notes (Signed)
Patient name: [name]  MRN: [id number] Date of birth: 1955/07/30  PCP: Cassell Smiles., MD, MD        Date: 06/02/2011  Brief history 56 year-old gentleman with mental retardation and hypertension who has been ill for about a week.  Family with Flu-like illness.  Admitted with DKA/HHS with BG in 1500 range.  Lines/tubes L subclavian CVC 12/29   Culture data/sepsis markers mrsa 12/29>>>neg Blood cx 12/29: 2/2 GPC clusters>>> Urine 1230>>> morganella (ONLY sens ceftriazone, gent, tobra)  Antibiotics Vancomycin 12/29 >>>1/1 Zosyn 12/29 >>>1/1 Ceftriaxone 1/1>>>  Best practice Heparin for DVT  Protocols/consults DKA protocol off 12/31 ICu insulin drip 12/31>>>off  Events/studies Renal US 12/31 - Small right renal cyst. Nonobstructing left renal calculus. No<BR>hydronephrosis. No perinephric fluid collection Insulin drip remains Asp on slp, may be a normal pattern  Vitals: Temp:  [97.9 F (36.6 C)-98.9 F (37.2 C)] 98.9 F (37.2 C) (01/02 0519) Pulse Rate:  [95-100] 95  (01/02 0519) Resp:  [18-21] 18  (01/02 0519) BP: (106-141)/(54-94) 141/94 mmHg (01/02 0519) SpO2:  [95 %-97 %] 97 % (01/02 0519) Weight:  [164 lb (74.39 kg)] 164 lb (74.39 kg) (01/02 0500)     Intake/Output Summary (Last 24 hours) at 06/02/11 0905 Last data filed at 06/02/11 0700  Gross per 24 hour  Intake   1224 ml  Output   1326 ml  Net   -102 ml    Physical exam General: pleasant male, NAD in bed, baseline mental retardation Eyes: Anicteric sclerae. ENT: poor dentition, mm dry Lymph: No cervical, supraclavicular, or axillary lymphadenopathy. Heart: Normal S1, S2. No murmurs Lungs: resps even non labored CTA Abdomen: Abdomen soft, non-tender and not distended, normoactive bowel sounds. No hepatosplenomegaly or masses. Musculoskeletal: No clubbing or synovitis. Skin: No rashes or lesions, no edema  Neuro: No focal neurologic deficits.   radiology  US Renal Port  05/31/2011   *RADIOLOGY REPORT*  Clinical Data: History of urinary tract infection and renal failure.  RENAL/URINARY TRACT ULTRASOUND COMPLETE  Comparison:  11/03/2010 ultrasound.  CT07/13/2011.  Findings:  Right Kidney:  Right renal length is 11.2 cm. There is a small hypoechoic area in the lower midportion consistent with a small cyst with internal low intensity echoes probably reflecting debris. This cystic area measures 1.3 x 0.9 x 1.2 cm in diameter. No right renal calculus was evident.  Left Kidney:  Left renal length is 11.3 cm. There is a calculus in the upper midportion measuring 1.7 x 1.6 x 1.6 cm. No left renal cystic area is seen.  Examination of each kidney shows no evidence of hydronephrosis, solid mass, calculus, parenchymal loss, or parenchymal textural abnormality.  Bladder:  Foley catheter visualized urinary bladder.  IMPRESSION: Small right renal cyst.  Nonobstructing left renal calculus.  No hydronephrosis.  No perinephric fluid collection.  Original Report Authenticated By: Crawford Givens, M.D.     LAB RESULT  Lab 06/02/11 0618 06/01/11 0200  NA 145 148*  K 4.7 3.6  CL 111 120*  CO2 16* 21  BUN 25* 18  CREATININE 1.48* 1.55*  GLUCOSE 408* 199*    Lab 06/01/11 0200 05/31/11 0400 05/29/11 2247  HGB 11.4* 11.9* 12.0*  HCT 33.0* 34.0* 36.8*  WBC 14.5* 14.9* 18.0*  PLT 122* 155 291     Assessment and Plan   DKA resolved, no GAP CBG (last 3)   Basename 06/02/11 0637 06/01/11 2138 06/01/11 1643  GLUCAP 367* 406* 356*  plan Increase lantus  Cont SSI  Anion gap acidosis in setting of lactic acidosis. Resolved.  Gap closed.   Hypernatremia -- improved.  Cont low dose 1/2 NS for now.   Lab 06/02/11 0618 06/01/11 0200 06/01/11 0152  NA 145 148* QUESTIONABLE RESULTS, RECOMMEND RECOLLECT TO VERIFY    Sepsis in setting of Presumed UTI and GPC bacteremia -- sepsis resolved.   Lab 06/01/11 0200 05/31/11 0400 05/29/11 2247  WBC 14.5* 14.9* 18.0*   Plan: -off pressors See  ID  Acute renal failure: in setting of volume depletion - improving.   Lab 06/02/11 0618 06/01/11 0200  CREATININE 1.48* 1.55*   plan: -cont IVFs to 1/2 NS Chem in am  Na responding well  Hypokalemia - resolved.   Lab 06/02/11 0618 06/01/11 0200 06/01/11 0152  K 4.7 3.6 QUESTIONABLE RESULTS, RECOMMEND RECOLLECT TO VERIFY   Plan -chem in am   UTI - morganella, MDR.  Stones without hydro Needs outpt urology folow up Cont ceftriaxone for now, needs total 10 days abx  ** will tx to [facility] 1/3   [facility] 06/02/2011, 9:05 AM    I have seen and examined this pt with Jasper Riling and agree with the above note  Shan [facility]   Beeper  [phone]  Cell  [phone]

## 2011-06-03 DIAGNOSIS — R7881 Bacteremia: Secondary | ICD-10-CM | POA: Diagnosis present

## 2011-06-03 DIAGNOSIS — E870 Hyperosmolality and hypernatremia: Secondary | ICD-10-CM | POA: Diagnosis not present

## 2011-06-03 LAB — CBC
HCT: 31.1 % — ABNORMAL LOW (ref 39.0–52.0)
MCV: 90.7 fL (ref 78.0–100.0)
Platelets: 143 10*3/uL — ABNORMAL LOW (ref 150–400)
RBC: 3.43 MIL/uL — ABNORMAL LOW (ref 4.22–5.81)
WBC: 16.1 10*3/uL — ABNORMAL HIGH (ref 4.0–10.5)

## 2011-06-03 LAB — GLUCOSE, CAPILLARY
Glucose-Capillary: 193 mg/dL — ABNORMAL HIGH (ref 70–99)
Glucose-Capillary: 214 mg/dL — ABNORMAL HIGH (ref 70–99)
Glucose-Capillary: 201 mg/dL — ABNORMAL HIGH (ref 70–99)
Glucose-Capillary: 211 mg/dL — ABNORMAL HIGH (ref 70–99)
Glucose-Capillary: 172 mg/dL — ABNORMAL HIGH (ref 70–99)
Glucose-Capillary: 144 mg/dL — ABNORMAL HIGH (ref 70–99)
Glucose-Capillary: 146 mg/dL — ABNORMAL HIGH (ref 70–99)
Glucose-Capillary: 305 mg/dL — ABNORMAL HIGH (ref 70–99)
Glucose-Capillary: 260 mg/dL — ABNORMAL HIGH (ref 70–99)
Glucose-Capillary: 233 mg/dL — ABNORMAL HIGH (ref 70–99)

## 2011-06-03 LAB — BASIC METABOLIC PANEL
CO2: 18 mEq/L — ABNORMAL LOW (ref 19–32)
Chloride: 125 mEq/L — ABNORMAL HIGH (ref 96–112)
Creatinine, Ser: 1.08 mg/dL (ref 0.50–1.35)
Potassium: 3.7 mEq/L (ref 3.5–5.1)

## 2011-06-03 MED ORDER — DEXTROSE 5 % IV SOLN
INTRAVENOUS | Status: DC
Start: 2011-06-03 — End: 2011-06-04
  Administered 2011-06-03 (×2): via INTRAVENOUS

## 2011-06-03 NOTE — Progress Notes (Signed)
CSW confirmed with patients Assisted living that facility can assist with patient diabetic needs as well as insulin injections.   Catha Gosselin, LCSWA  [phone] .06/03/2011 13:31pm

## 2011-06-03 NOTE — Progress Notes (Signed)
Subjective: He is awake and alert and demanding to get out of bed. His sister continues to say that he cannot get up until he takes a nap. He has been wanting to eat and a diet has been resumed. His sister states that it is a known fact that he aspirated but they do not want to limit his feeding and understand the risks with aspiration.  She states that he has been sick with a flu like illness for over a week and has been too weak to ambulate at the AL facility.   Objective: Blood pressure 112/62, pulse 112, temperature 98.9 F (37.2 C), temperature source Axillary, resp. rate 15, height 5\' 11"  (1.803 m), weight 78 kg (171 lb 15.3 oz), SpO2 97.00%. Weight change: 3.61 kg (7 lb 15.3 oz)  Intake/Output Summary (Last 24 hours) at 06/03/11 1351 Last data filed at 06/03/11 0836  Gross per 24 hour  Intake   1125 ml  Output   3076 ml  Net  -1951 ml    Physical Exam: General appearance: alert and slowed mentation Head: Normocephalic, without obvious abnormality, atraumatic Throat: lips, mucosa, and tongue normal Lungs: clear to auscultation bilaterally, he has a congested cough.  Heart: regular rate and rhythm, S1, S2 normal, no murmur, click, rub or gallop Abdomen: soft, non-tender; bowel sounds normal; no masses,  no organomegaly Extremities: extremities normal, atraumatic, no cyanosis or edema  Lab Results:  Basename 06/03/11 0500 06/02/11 1600 06/02/11 0618  NA 151* 146* --  K 3.7 3.7 --  CL 125* 121* --  CO2 18* 16* --  GLUCOSE 200* 295* --  BUN 17 24* --  CREATININE 1.08 1.21 --  CALCIUM 7.5* 7.3* --  MG -- -- 2.5  PHOS -- -- --      Basename 06/03/11 0500 06/01/11 0200  WBC 16.1* 14.5*  NEUTROABS -- 12.3*  HGB 10.4* 11.4*  HCT 31.1* 33.0*  MCV 90.7 89.2  PLT 143* 122*    No components found with this basename: POCBNP:3 No results found for this basename: DDIMER:2 in the last 72 hours No results found for this basename: HGBA1C:2 in the last 72 hours No results  found for this basename: CHOL:2,HDL:2,LDLCALC:2,TRIG:2,CHOLHDL:2,LDLDIRECT:2 in the last 72 hours No results found for this basename: TSH,T4TOTAL,FREET3,T3FREE,THYROIDAB in the last 72 hours No results found for this basename: VITAMINB12:2,FOLATE:2,FERRITIN:2,TIBC:2,IRON:2,RETICCTPCT:2 in the last 72 hours  Micro Results: Recent Results (from the past 240 hour(s))  CULTURE, BLOOD (ROUTINE X 2)     Status: Normal (Preliminary result)   Collection Time   05/29/11  5:30 PM      Component Value Range Status Comment   Specimen Description BLOOD FEMORAL ARTERY COLLECTED BY DOCTOR   Final    Special Requests BOTTLES DRAWN AEROBIC AND ANAEROBIC 6CC   Final    Setup Time [id number]   Final    Culture     Final    Value:        BLOOD CULTURE RECEIVED NO GROWTH TO DATE CULTURE WILL BE HELD FOR 5 DAYS BEFORE ISSUING A FINAL NEGATIVE REPORT     POSITIVE COCCI IN CLUSTERS Note: PREVIOUS REPORT GRAM Performed at [facility] CORRECTED RESULTS CALLED TO: KATRISE @ 1610 05/31/11 WICKN   Report Status PENDING   Incomplete   URINE CULTURE     Status: Normal   Collection Time   05/29/11  5:50 PM      Component Value Range Status Comment   Specimen Description URINE, CATHETERIZED   Final  Special Requests NONE   Final    Setup Time [id number]   Final    Colony Count 80,000 COLONIES/ML   Final    Culture [facility] MORGANII   Final    Report Status 06/02/2011 FINAL   Final    Organism ID, Bacteria MORGANELLA MORGANII   Final   MRSA PCR SCREENING     Status: Normal   Collection Time   05/29/11  9:23 PM      Component Value Range Status Comment   MRSA by PCR NEGATIVE  NEGATIVE  Final   URINE CULTURE     Status: Normal   Collection Time   05/30/11 12:08 AM      Component Value Range Status Comment   Specimen Description URINE, CATHETERIZED   Final    Special Requests PATIENT ON FOLLOWING ZOSYN [facility]   Final    Setup Time [id number]   Final    Colony Count 70,000 COLONIES/ML   Final     Culture [facility] MORGANII   Final    Report Status 06/01/2011 FINAL   Final    Organism ID, Bacteria MORGANELLA MORGANII   Final     Studies/Results: US Renal Port  05/31/2011  *RADIOLOGY REPORT*  Clinical Data: History of urinary tract infection and renal failure.  RENAL/URINARY TRACT ULTRASOUND COMPLETE  Comparison:  11/03/2010 ultrasound.  CT07/13/2011.  Findings:  Right Kidney:  Right renal length is 11.2 cm. There is a small hypoechoic area in the lower midportion consistent with a small cyst with internal low intensity echoes probably reflecting debris. This cystic area measures 1.3 x 0.9 x 1.2 cm in diameter. No right renal calculus was evident.  Left Kidney:  Left renal length is 11.3 cm. There is a calculus in the upper midportion measuring 1.7 x 1.6 x 1.6 cm. No left renal cystic area is seen.  Examination of each kidney shows no evidence of hydronephrosis, solid mass, calculus, parenchymal loss, or parenchymal textural abnormality.  Bladder:  Foley catheter visualized urinary bladder.  IMPRESSION: Small right renal cyst.  Nonobstructing left renal calculus.  No hydronephrosis.  No perinephric fluid collection.  Original Report Authenticated By: Crawford Givens, M.D.   Dg Chest Port 1 View  05/31/2011  *RADIOLOGY REPORT*  Clinical Data: Follow-up endotracheal tube position.  Diabetic ketoacidosis.  PORTABLE CHEST - 1 VIEW  Comparison: 05/29/2011 radiographs.  Findings: 0510 hours.  There is patient rotation to the right.  The mandible overlies the right lung apex.  No endotracheal tube is visible.  Left subclavian central venous catheter appears unchanged in the lower SVC.  There are slightly lower lung volumes.  Mild perihilar atelectasis on the right appears stable.  There is no edema or confluent airspace opacity.  There is no pleural effusion or pneumothorax.  Heart size is unchanged.  IMPRESSION:  1.  Lower lung volumes with mildly increased right perihilar atelectasis. 2.  No visible  endotracheal tube - correlate clinically.  Original Report Authenticated By: Gerrianne Scale, M.D.    Medications: Scheduled Meds:   . cefTRIAXone (ROCEPHIN)  IV  1 g Intravenous Q24H  . heparin  5,000 Units Subcutaneous Q8H  . potassium chloride  10 mEq Intravenous Q1H  . DISCONTD: heparin  5,000 Units Subcutaneous Q8H   Continuous Infusions:   . sodium chloride 999 mL/hr at 06/02/11 1302  . dextrose    . insulin (NOVOLIN-R) infusion 4.5 mL/hr at 06/03/11 0700  . DISCONTD: sodium chloride 150 mL/hr at 06/02/11 1353  . DISCONTD: dextrose  5 % and 0.45% NaCl 125 mL/hr at 06/03/11 0008   PRN Meds:.dextrose  Assessment/Plan: Principal Problem:  *DKA (diabetic ketoacidoses)- new onset diabetes. Will get an a1c. He will most likely will need insulin on d/c. Will keep him on drip for today. Sugars will increase as we have started allowing him to eat today and he is receiving D5W for hypernatremia.   Gr pos cocci bacteremia- Strep? Will continue Rocephin started 1/1. Was previuosly on Vanc/Zosyn started on 12/29.  Will need 14 days of treatment. Will follow final culture results.   Moganella Morganii UTI- cont Rocephin   Hyperkalemia- resolved Hypernatremia-  From NS. Currently on D5 water as sodium continued to increase on D51/2 NS.    Renal failure- resolved w/ hydration  Developmentally disabled Disposition- as he has not ambulated in over 1 week he may have a slow transition to becoming mobile again and may require PT. Will order a PT eval for tomorrow.    LOS: 5 days   [facility] [phone] 06/03/2011, 1:51 PM

## 2011-06-03 NOTE — Progress Notes (Signed)
Dr. Melvenia Beam ([facility]) notified at 0000 06/03/11 regarding continuous serosanguineous drainage at PICC site after new dressing was applied.  IV team was also notified.  Fluids were changed to Distal port to per MD instruction to decrease drainage.  Will continue to monitor.  Pt. Tolerating infusion well at site.

## 2011-06-04 LAB — BASIC METABOLIC PANEL
CO2: 20 mEq/L (ref 19–32)
Calcium: 7.1 mg/dL — ABNORMAL LOW (ref 8.4–10.5)
GFR calc non Af Amer: 81 mL/min — ABNORMAL LOW (ref >90)
Sodium: 141 mEq/L (ref 135–145)

## 2011-06-04 LAB — CULTURE, BLOOD (ROUTINE X 2)
Culture  Setup Time: 201212302036
Culture: NO GROWTH

## 2011-06-04 LAB — GLUCOSE, CAPILLARY
Glucose-Capillary: 147 mg/dL — ABNORMAL HIGH (ref 70–99)
Glucose-Capillary: 148 mg/dL — ABNORMAL HIGH (ref 70–99)
Glucose-Capillary: 177 mg/dL — ABNORMAL HIGH (ref 70–99)
Glucose-Capillary: 179 mg/dL — ABNORMAL HIGH (ref 70–99)
Glucose-Capillary: 190 mg/dL — ABNORMAL HIGH (ref 70–99)
Glucose-Capillary: 190 mg/dL — ABNORMAL HIGH (ref 70–99)
Glucose-Capillary: 197 mg/dL — ABNORMAL HIGH (ref 70–99)
Glucose-Capillary: 167 mg/dL — ABNORMAL HIGH (ref 70–99)
Glucose-Capillary: 183 mg/dL — ABNORMAL HIGH (ref 70–99)
Glucose-Capillary: 201 mg/dL — ABNORMAL HIGH (ref 70–99)
Glucose-Capillary: 248 mg/dL — ABNORMAL HIGH (ref 70–99)
Glucose-Capillary: 268 mg/dL — ABNORMAL HIGH (ref 70–99)
Glucose-Capillary: 296 mg/dL — ABNORMAL HIGH (ref 70–99)

## 2011-06-04 LAB — CBC
Platelets: 166 10*3/uL (ref 150–400)
RDW: 13.7 % (ref 11.5–15.5)
WBC: 13.1 10*3/uL — ABNORMAL HIGH (ref 4.0–10.5)

## 2011-06-04 LAB — POTASSIUM: Potassium: 3.5 mEq/L (ref 3.5–5.1)

## 2011-06-04 MED ORDER — INSULIN GLARGINE 100 UNIT/ML ~~LOC~~ SOLN
30.0000 [IU] | Freq: Every day | SUBCUTANEOUS | Status: DC
  Filled 2011-06-04: qty 3

## 2011-06-04 MED ORDER — POTASSIUM CHLORIDE CRYS ER 20 MEQ PO TBCR
40.0000 meq | EXTENDED_RELEASE_TABLET | ORAL | Status: AC
  Administered 2011-06-04 (×2): 40 meq via ORAL
  Filled 2011-06-04 (×2): qty 2

## 2011-06-04 MED ORDER — INSULIN ASPART 100 UNIT/ML ~~LOC~~ SOLN
0.0000 [IU] | Freq: Every day | SUBCUTANEOUS | Status: DC
  Administered 2011-06-04: 3 [IU] via SUBCUTANEOUS
  Administered 2011-06-05: 2 [IU] via SUBCUTANEOUS
  Administered 2011-06-06: 4 [IU] via SUBCUTANEOUS

## 2011-06-04 MED ORDER — INSULIN GLARGINE 100 UNIT/ML ~~LOC~~ SOLN
20.0000 [IU] | Freq: Every day | SUBCUTANEOUS | Status: DC
  Administered 2011-06-04: 20 [IU] via SUBCUTANEOUS

## 2011-06-04 MED ORDER — INSULIN ASPART 100 UNIT/ML ~~LOC~~ SOLN
0.0000 [IU] | Freq: Three times a day (TID) | SUBCUTANEOUS | Status: DC
  Administered 2011-06-04: 8 [IU] via SUBCUTANEOUS
  Administered 2011-06-05: 15 [IU] via SUBCUTANEOUS
  Administered 2011-06-05: 11 [IU] via SUBCUTANEOUS
  Administered 2011-06-05: 5 [IU] via SUBCUTANEOUS
  Filled 2011-06-04: qty 3

## 2011-06-04 MED ORDER — POTASSIUM CHLORIDE CRYS ER 20 MEQ PO TBCR: 40.0000 meq | EXTENDED_RELEASE_TABLET | ORAL | Status: DC

## 2011-06-04 NOTE — Progress Notes (Signed)
06/04/11  Spoke with patient's family member.  She states that patient is probably going back to nursing home where he was.  Suggested that she watch the DM videos for some education about diabetes.   Will continue to follow while in hospital.  Smith Mince RN BSN

## 2011-06-04 NOTE — Progress Notes (Signed)
Physical Therapy Evaluation Patient Details Name: [name] MRN: [id number] DOB: 02/12/1956 Today's Date: 06/04/2011  Problem List:  Patient Active Problem List  Diagnoses  . Hyperkalemia  . DKA (diabetic ketoacidoses)  . Renal failure  . UTI (lower urinary tract infection)  . Bacteremia due to Gram-positive bacteria  . Hypernatremia    Past Medical History:  Past Medical History  Diagnosis Date  . MR (mental retardation)   . Hypertension   . UTI (urinary tract infection)    Past Surgical History: History reviewed. No pertinent past surgical history.  PT Assessment/Plan/Recommendation PT Assessment Clinical Impression Statement: Pt presents to [facility] with DKA. Lives with mother at ALF. Per daughter mother is very independent and this pt has not needed help with anything but bathing prior to his admission. Because he has been relatively immobile since his admission he has developed some generalized weakness limiting his ability to mobilize independently. Will benefit from  physical therapy in the acute setting for these and the following problem list so as to maximize his mobility and independence to decrease his burden of care at home. He will need assistance for ambulation now as using the RW is new for him. As long as the ALF is willing to assist him with toileting, bathing and ambulation I would recommend HHPT at ALF. Will continue to follow acutely.  Pt will also benefit from daily mobility/ambulation with nursing staff to prevent any further deconditioning. PT Recommendation/Assessment: Patient will need skilled PT in the acute care venue PT Problem List: Decreased strength;Decreased range of motion;Decreased activity tolerance;Decreased balance;Decreased cognition;Decreased mobility;Decreased knowledge of use of DME;Decreased safety awareness;Decreased knowledge of precautions;Cardiopulmonary status limiting activity PT Therapy Diagnosis : Difficulty walking;Abnormality of  gait;Generalized weakness PT Plan PT Frequency: Min 3X/week PT Treatment/Interventions: DME instruction;Gait training;Functional mobility training;Therapeutic exercise;Therapeutic activities;Balance training;Neuromuscular re-education;Patient/family education PT Recommendation Recommendations for Other Services: OT consult Follow Up Recommendations: Home health PT;24 hour supervision/assistance Equipment Recommended: Rolling walker with 5" wheels PT Goals  Acute Rehab PT Goals PT Goal Formulation: With patient Pt will Roll Supine to Right Side: with supervision PT Goal: Rolling Supine to Right Side - Progress: Progressing toward goal Pt will Roll Supine to Left Side: with supervision PT Goal: Rolling Supine to Left Side - Progress: Progressing toward goal Pt will go Supine/Side to Sit: with supervision PT Goal: Supine/Side to Sit - Progress: Progressing toward goal Pt will go Sit to Supine/Side: with supervision PT Goal: Sit to Supine/Side - Progress: Progressing toward goal Pt will go Sit to Stand: with supervision PT Goal: Sit to Stand - Progress: Progressing toward goal Pt will go Stand to Sit: with supervision PT Goal: Stand to Sit - Progress: Progressing toward goal Pt will Transfer Bed to Chair/Chair to Bed: with supervision PT Transfer Goal: Bed to Chair/Chair to Bed - Progress: Progressing toward goal Pt will Ambulate: >150 feet;with least restrictive assistive device;with supervision PT Goal: Ambulate - Progress: Progressing toward goal  PT Evaluation Precautions/Restrictions  Precautions Precautions: Fall Prior Functioning  Home Living Lives With: Family (mother and he live at ALF) Receives Help From: Personal care attendant (ALF staff provide baths but otherwise he was independent) Type of Home: Apartment Home Layout: One level Home Access: Level entry Bathroom Toilet: Handicapped height Home Adaptive Equipment: None Additional Comments: Per sister the ALF staff  doesn't provide much assistance  Prior Function Level of Independence: Needs assistance with ADLs;Independent with transfers;Independent with gait (independent with toileting) Bath: Minimal Driving: No Vocation: Unemployed Financial risk analyst Arousal/Alertness: Awake/alert  Overall Cognitive Status: History of cognitive impairments History of Cognitive Impairment: Appears at baseline functioning Orientation Level:  (did not assess orientation) Sensation/Coordination Sensation Light Touch: Appears Intact Coordination Gross Motor Movements are Fluid and Coordinated: Yes Fine Motor Movements are Fluid and Coordinated: No (likely baseline) Extremity Assessment RUE Assessment RUE Assessment: Within Functional Limits LUE Assessment LUE Assessment: Within Functional Limits RLE Assessment RLE Assessment:  (grossly 4/5; pain to knee with resisted extension) LLE Assessment LLE Assessment:  (grossly 3+/5) Mobility (including Balance) Bed Mobility Bed Mobility: No (pt sitting in recliner, RN reports he was lifted by maxi mov) Transfers Transfers: Yes Sit to Stand: 3: Mod assist Sit to Stand Details (indicate cue type and reason): cueing for sequencing specifically anterior translation and hand placement, stood x2, first attempt once pt standing he was reaching out for support so we had him sit while we got the walker; secod stand again with modA but able to stabilize himself on the RW Stand to Sit: 3: Mod assist Stand to Sit Details: modA to control descent; pt did not let go of RW despite verbal cueing Ambulation/Gait Ambulation/Gait: Yes Ambulation/Gait Assistance: 4: Min assist Ambulation/Gait Assistance Details (indicate cue type and reason): minA for sequencing and safety with RW; pt also needing assist to self monitor given cognitive status, HR rose to 130s during ambulation; pt with baseline scoliosis so full erect posture for him during gait is not a plausible goal; limited by  fatigue Ambulation Distance (Feet): 20 Feet Assistive device: Rolling walker Gait Pattern: Trunk flexed;Trunk rotated posteriorly on left;Shuffle  Posture/Postural Control Posture/Postural Control: Postural limitations Postural Limitations: baseline scoliosis with convexity right Balance Balance Assessed: No Exercise  Total Joint Exercises Ankle Circles/Pumps: AROM;Right;Left;Both;Seated End of Session PT - End of Session Equipment Utilized During Treatment: Gait belt Activity Tolerance: Patient tolerated treatment well;Patient limited by fatigue Patient left: in chair Nurse Communication: Mobility status for transfers;Mobility status for ambulation General Behavior During Session: [facility] for tasks performed Cognition: Impaired, at baseline  [facility] HELEN 06/04/2011, 3:36 PM

## 2011-06-04 NOTE — Progress Notes (Signed)
Subjective: Doing well. Neither the patient nor his sister have any complaints.   Objective: Blood pressure 127/64, pulse 100, temperature 99.5 F (37.5 C), temperature source Axillary, resp. rate 23, height 5\' 11"  (1.803 m), weight 78 kg (171 lb 15.3 oz), SpO2 96.00%. Weight change:   Intake/Output Summary (Last 24 hours) at 06/04/11 1531 Last data filed at 06/04/11 1121  Gross per 24 hour  Intake 2298.8 ml  Output   1725 ml  Net  573.8 ml    Physical Exam: General appearance: alert and slowed mentation Head: Normocephalic, without obvious abnormality, atraumatic Throat: lips, mucosa, and tongue normal Lungs: clear to auscultation bilaterally, he has a congested cough.  Heart: regular rate and rhythm, S1, S2 normal, no murmur, click, rub or gallop Abdomen: soft, non-tender; bowel sounds normal; no masses,  no organomegaly Extremities: extremities normal, atraumatic, no cyanosis or edema  Lab Results:  Basename 06/04/11 0500 06/03/11 0500 06/02/11 0618  NA 141 151* --  K 2.9* 3.7 --  CL 115* 125* --  CO2 20 18* --  GLUCOSE 176* 200* --  BUN 9 17 --  CREATININE 1.02 1.08 --  CALCIUM 7.1* 7.5* --  MG -- -- 2.5  PHOS -- -- --      Basename 06/04/11 0500 06/03/11 0500  WBC 13.1* 16.1*  NEUTROABS -- --  HGB 9.9* 10.4*  HCT 29.5* 31.1*  MCV 91.3 90.7  PLT 166 143*    No components found with this basename: POCBNP:3 No results found for this basename: DDIMER:2 in the last 72 hours No results found for this basename: HGBA1C:2 in the last 72 hours No results found for this basename: CHOL:2,HDL:2,LDLCALC:2,TRIG:2,CHOLHDL:2,LDLDIRECT:2 in the last 72 hours No results found for this basename: TSH,T4TOTAL,FREET3,T3FREE,THYROIDAB in the last 72 hours No results found for this basename: VITAMINB12:2,FOLATE:2,FERRITIN:2,TIBC:2,IRON:2,RETICCTPCT:2 in the last 72 hours  Micro Results: Recent Results (from the past 240 hour(s))  CULTURE, BLOOD (ROUTINE X 2)     Status: Normal    Collection Time   05/29/11  5:30 PM      Component Value Range Status Comment   Specimen Description BLOOD FEMORAL ARTERY COLLECTED BY DOCTOR   Final    Special Requests BOTTLES DRAWN AEROBIC AND ANAEROBIC [facility]   Final    Setup Time [id number]   Final    Culture     Final    Value: NO GROWTH 5 DAYS     POSITIVE COCCI IN CLUSTERS Note: PREVIOUS REPORT GRAM Performed at [facility] CORRECTED RESULTS CALLED TO: KATRISE @ 1220 05/31/11 WICKN   Report Status 06/04/2011 FINAL   Final   URINE CULTURE     Status: Normal   Collection Time   05/29/11  5:50 PM      Component Value Range Status Comment   Specimen Description URINE, CATHETERIZED   Final    Special Requests NONE   Final    Setup Time [id number]   Final    Colony Count 80,000 COLONIES/ML   Final    Culture [facility] MORGANII   Final    Report Status 06/02/2011 FINAL   Final    Organism ID, Bacteria MORGANELLA MORGANII   Final   MRSA PCR SCREENING     Status: Normal   Collection Time   05/29/11  9:23 PM      Component Value Range Status Comment   MRSA by PCR NEGATIVE  NEGATIVE  Final   URINE CULTURE     Status: Normal   Collection Time  05/30/11 12:08 AM      Component Value Range Status Comment   Specimen Description URINE, CATHETERIZED   Final    Special Requests PATIENT ON FOLLOWING [facility]   Final    Setup Time [id number]   Final    Colony Count 70,000 COLONIES/ML   Final    Culture [facility] MORGANII   Final    Report Status 06/01/2011 FINAL   Final    Organism ID, Bacteria MORGANELLA MORGANII   Final     Studies/Results: US Renal Port  05/31/2011  *RADIOLOGY REPORT*  Clinical Data: History of urinary tract infection and renal failure.  RENAL/URINARY TRACT ULTRASOUND COMPLETE  Comparison:  11/03/2010 ultrasound.  CT07/13/2011.  Findings:  Right Kidney:  Right renal length is 11.2 cm. There is a small hypoechoic area in the lower midportion consistent with a small cyst with internal low intensity  echoes probably reflecting debris. This cystic area measures 1.3 x 0.9 x 1.2 cm in diameter. No right renal calculus was evident.  Left Kidney:  Left renal length is 11.3 cm. There is a calculus in the upper midportion measuring 1.7 x 1.6 x 1.6 cm. No left renal cystic area is seen.  Examination of each kidney shows no evidence of hydronephrosis, solid mass, calculus, parenchymal loss, or parenchymal textural abnormality.  Bladder:  Foley catheter visualized urinary bladder.  IMPRESSION: Small right renal cyst.  Nonobstructing left renal calculus.  No hydronephrosis.  No perinephric fluid collection.  Original Report Authenticated By: Crawford Givens, M.D.   Dg Chest Port 1 View  05/31/2011  *RADIOLOGY REPORT*  Clinical Data: Follow-up endotracheal tube position.  Diabetic ketoacidosis.  PORTABLE CHEST - 1 VIEW  Comparison: 05/29/2011 radiographs.  Findings: 0510 hours.  There is patient rotation to the right.  The mandible overlies the right lung apex.  No endotracheal tube is visible.  Left subclavian central venous catheter appears unchanged in the lower SVC.  There are slightly lower lung volumes.  Mild perihilar atelectasis on the right appears stable.  There is no edema or confluent airspace opacity.  There is no pleural effusion or pneumothorax.  Heart size is unchanged.  IMPRESSION:  1.  Lower lung volumes with mildly increased right perihilar atelectasis. 2.  No visible endotracheal tube - correlate clinically.  Original Report Authenticated By: Gerrianne Scale, M.D.    Medications: Scheduled Meds:    . cefTRIAXone (ROCEPHIN)  IV  1 g Intravenous Q24H  . heparin  5,000 Units Subcutaneous Q8H  . insulin aspart  0-15 Units Subcutaneous TID WC  . insulin aspart  0-5 Units Subcutaneous QHS  . insulin glargine  30 Units Subcutaneous QHS  . potassium chloride  40 mEq Oral Q4H  . DISCONTD: potassium chloride  40 mEq Oral Q4H   Continuous Infusions:    . DISCONTD: dextrose 100 mL/hr at 06/03/11  1829  . DISCONTD: insulin (NOVOLIN-R) infusion 8.3 Units/hr (06/04/11 1244)   PRN Meds:.DISCONTD: dextrose  Assessment/Plan:   *DKA (diabetic ketoacidoses)- new onset diabetes. Will get an a1c. I have switched him off the drip to s/c insulin. If sugars remain stable, I will transfer him out of step down in AM.   Gr pos cocci bacteremia- report was incorrect. I have confirmed with the lab and he has had no growth in 5 days.   Moganella Morganii UTI- cont Rocephin   Hyperkalemia- resolved. Now hypokalemic. Being replaced. Recheck later today. Will check Mg+as well.  Hypernatremia-  From NS. Corrected with D5W.   Renal  failure- resolved w/ hydration Renal cysts and non obstructing calculus Developmentally disabled Disposition- as he has not ambulated in over 1 week he may have a slow transition to becoming mobile again and may require PT. Have ordered PT eval.     LOS: 6 days   [facility] [phone] 06/04/2011, 3:31 PM

## 2011-06-04 NOTE — Progress Notes (Signed)
Clinical social worker spoke with patient sister to confirm patient plans to return to assisted living with home health pt following. .Clinical social worker continuing to follow pt to assist with pt dc plans and further csw needs.   Catha Gosselin, Theresia Majors  [phone] .06/04/2011 16:35

## 2011-06-05 LAB — GLUCOSE, CAPILLARY
Glucose-Capillary: 230 mg/dL — ABNORMAL HIGH (ref 70–99)
Glucose-Capillary: 310 mg/dL — ABNORMAL HIGH (ref 70–99)
Glucose-Capillary: 468 mg/dL — ABNORMAL HIGH (ref 70–99)
Glucose-Capillary: 221 mg/dL — ABNORMAL HIGH (ref 70–99)

## 2011-06-05 MED ORDER — INSULIN ASPART 100 UNIT/ML ~~LOC~~ SOLN
5.0000 [IU] | Freq: Once | SUBCUTANEOUS | Status: AC
  Administered 2011-06-05: 5 [IU] via SUBCUTANEOUS
  Filled 2011-06-05: qty 3

## 2011-06-05 MED ORDER — INSULIN ASPART 100 UNIT/ML ~~LOC~~ SOLN
0.0000 [IU] | Freq: Three times a day (TID) | SUBCUTANEOUS | Status: DC
  Administered 2011-06-06: 15 [IU] via SUBCUTANEOUS
  Administered 2011-06-06 (×2): 4 [IU] via SUBCUTANEOUS
  Administered 2011-06-07: 15 [IU] via SUBCUTANEOUS
  Administered 2011-06-07: 11 [IU] via SUBCUTANEOUS
  Administered 2011-06-07: 4 [IU] via SUBCUTANEOUS
  Filled 2011-06-05: qty 3

## 2011-06-05 MED ORDER — INSULIN ASPART 100 UNIT/ML ~~LOC~~ SOLN
4.0000 [IU] | Freq: Three times a day (TID) | SUBCUTANEOUS | Status: DC
  Administered 2011-06-06 – 2011-06-07 (×5): 4 [IU] via SUBCUTANEOUS

## 2011-06-05 MED ORDER — INSULIN GLARGINE 100 UNIT/ML ~~LOC~~ SOLN: 26.0000 [IU] | Freq: Every day | SUBCUTANEOUS | Status: DC

## 2011-06-05 MED ORDER — INSULIN GLARGINE 100 UNIT/ML ~~LOC~~ SOLN
28.0000 [IU] | Freq: Every day | SUBCUTANEOUS | Status: DC
Start: 2011-06-05 — End: 2011-06-07
  Administered 2011-06-05 – 2011-06-06 (×2): 28 [IU] via SUBCUTANEOUS
  Filled 2011-06-05: qty 3

## 2011-06-05 NOTE — Plan of Care (Signed)
Problem: Phase II Progression Outcomes Goal: Discharge plan established Up in chair and to [facility] today with 2-person assist

## 2011-06-05 NOTE — Progress Notes (Signed)
Patient received as transfer from stepdown via recliner. VSS. Denies pain. Orders reviewed. Sister at bedside. [facility]

## 2011-06-05 NOTE — Plan of Care (Signed)
Problem: Phase I Progression Outcomes Goal: NPO or per MD order Outcome: Completed/Met Date Met:  06/05/11 Carb Modified-tolerating Goal: Initial discharge plan identified Outcome: Completed/Met Date Met:  06/05/11 Will return to [facility] where Mom also resides

## 2011-06-05 NOTE — Progress Notes (Signed)
Subjective: Doing well. Sister states his voice is hoarse. Patient has no complaints.   Objective: Blood pressure 107/59, pulse 95, temperature 98.3 F (36.8 C), temperature source Axillary, resp. rate 17, height 5\' 11"  (1.803 m), weight 78.6 kg (173 lb 4.5 oz), SpO2 99.00%. Weight change:   Intake/Output Summary (Last 24 hours) at 06/05/11 1822 Last data filed at 06/05/11 1700  Gross per 24 hour  Intake   1480 ml  Output   2603 ml  Net  -1123 ml    Physical Exam: General appearance: alert and slowed mentation Head: Normocephalic, without obvious abnormality, atraumatic Throat: lips, mucosa, and tongue normal Lungs: clear to auscultation bilaterally, he has a congested cough.  Heart: regular rate and rhythm, S1, S2 normal, no murmur, click, rub or gallop Abdomen: soft, non-tender; bowel sounds normal; no masses,  no organomegaly Extremities: extremities normal, atraumatic, no cyanosis or edema  Lab Results:  Basename 06/04/11 1627 06/04/11 0500 06/03/11 0500  NA -- 141 151*  K 3.5 2.9* --  CL -- 115* 125*  CO2 -- 20 18*  GLUCOSE -- 176* 200*  BUN -- 9 17  CREATININE -- 1.02 1.08  CALCIUM -- 7.1* 7.5*  MG 1.8 -- --  PHOS -- -- --      Basename 06/04/11 0500 06/03/11 0500  WBC 13.1* 16.1*  NEUTROABS -- --  HGB 9.9* 10.4*  HCT 29.5* 31.1*  MCV 91.3 90.7  PLT 166 143*    No components found with this basename: POCBNP:3 No results found for this basename: DDIMER:2 in the last 72 hours No results found for this basename: HGBA1C:2 in the last 72 hours No results found for this basename: CHOL:2,HDL:2,LDLCALC:2,TRIG:2,CHOLHDL:2,LDLDIRECT:2 in the last 72 hours No results found for this basename: TSH,T4TOTAL,FREET3,T3FREE,THYROIDAB in the last 72 hours No results found for this basename: VITAMINB12:2,FOLATE:2,FERRITIN:2,TIBC:2,IRON:2,RETICCTPCT:2 in the last 72 hours  Micro Results: Recent Results (from the past 240 hour(s))  CULTURE, BLOOD (ROUTINE X 2)     Status:  Normal   Collection Time   05/29/11  5:30 PM      Component Value Range Status Comment   Specimen Description BLOOD FEMORAL ARTERY COLLECTED BY DOCTOR   Final    Special Requests BOTTLES DRAWN AEROBIC AND ANAEROBIC [facility]   Final    Setup Time [id number]   Final    Culture     Final    Value: NO GROWTH 5 DAYS     POSITIVE COCCI IN CLUSTERS Note: PREVIOUS REPORT GRAM Performed at [facility] CORRECTED RESULTS CALLED TO: KATRISE @ 1220 05/31/11 WICKN   Report Status 06/04/2011 FINAL   Final   URINE CULTURE     Status: Normal   Collection Time   05/29/11  5:50 PM      Component Value Range Status Comment   Specimen Description URINE, CATHETERIZED   Final    Special Requests NONE   Final    Setup Time [id number]   Final    Colony Count 80,000 COLONIES/ML   Final    Culture [facility] MORGANII   Final    Report Status 06/02/2011 FINAL   Final    Organism ID, Bacteria MORGANELLA MORGANII   Final   MRSA PCR SCREENING     Status: Normal   Collection Time   05/29/11  9:23 PM      Component Value Range Status Comment   MRSA by PCR NEGATIVE  NEGATIVE  Final   URINE CULTURE     Status: Normal   Collection  Time   05/30/11 12:08 AM      Component Value Range Status Comment   Specimen Description URINE, CATHETERIZED   Final    Special Requests PATIENT ON FOLLOWING [facility]   Final    Setup Time [id number]   Final    Colony Count 70,000 COLONIES/ML   Final    Culture [facility] MORGANII   Final    Report Status 06/01/2011 FINAL   Final    Organism ID, Bacteria MORGANELLA MORGANII   Final     Studies/Results: Medications: Scheduled Meds:    . cefTRIAXone (ROCEPHIN)  IV  1 g Intravenous Q24H  . heparin  5,000 Units Subcutaneous Q8H  . insulin aspart  0-20 Units Subcutaneous TID WC  . insulin aspart  0-5 Units Subcutaneous QHS  . insulin aspart  4 Units Subcutaneous TID WC  . insulin aspart  5 Units Subcutaneous Once  . insulin glargine  26 Units Subcutaneous QHS  .  DISCONTD: insulin aspart  0-15 Units Subcutaneous TID WC  . DISCONTD: insulin glargine  20 Units Subcutaneous QHS  . DISCONTD: insulin glargine  30 Units Subcutaneous QHS   Continuous Infusions:   PRN Meds:.  Assessment/Plan:   *DKA (diabetic ketoacidoses)- new onset diabetes. Will get an a1c. Transferred out of step down. Adjusting insulin to improve sugars.    Gr pos cocci bacteremia- report was incorrect. I have confirmed with the lab and he has had no growth in 5 days.   Moganella Morganii UTI- cont Rocephin   Hyperkalemia- resolved. Now hypokalemic. Being replaced. Recheck was normal. Will recheck in AM as well.   Hypernatremia-  From NS. Corrected with D5W.   Renal failure- resolved w/ hydration Renal cysts and non obstructing calculus Developmentally disabled Disposition- Will need HHPT. Sister is in agreement with letting him return on Monday. Remove Foley. We will continue to adjust his insulin over the weekend.    LOS: 7 days   [facility] [phone] 06/05/2011, 6:22 PM

## 2011-06-06 LAB — BASIC METABOLIC PANEL
BUN: 8 mg/dL (ref 6–23)
CO2: 20 mEq/L (ref 19–32)
Chloride: 110 mEq/L (ref 96–112)
GFR calc Af Amer: >90 mL/min (ref >90)
Potassium: 3.1 mEq/L — ABNORMAL LOW (ref 3.5–5.1)

## 2011-06-06 LAB — HEMOGLOBIN A1C
Hgb A1c MFr Bld: 13 % — ABNORMAL HIGH (ref <5.7)
Mean Plasma Glucose: 326 mg/dL — ABNORMAL HIGH (ref <117)

## 2011-06-06 LAB — GLUCOSE, CAPILLARY
Glucose-Capillary: 185 mg/dL — ABNORMAL HIGH (ref 70–99)
Glucose-Capillary: 330 mg/dL — ABNORMAL HIGH (ref 70–99)

## 2011-06-06 MED ORDER — POTASSIUM CHLORIDE CRYS ER 20 MEQ PO TBCR
40.0000 meq | EXTENDED_RELEASE_TABLET | Freq: Once | ORAL | Status: AC
  Administered 2011-06-06: 40 meq via ORAL
  Filled 2011-06-06: qty 2

## 2011-06-06 NOTE — Progress Notes (Signed)
Subjective: Feeling better.  Sister by bedside who also agreed that the patient was doing better.  Objective: Vital signs in last 24 hours: Filed Vitals:   06/05/11 1745 06/05/11 2230 06/06/11 0650 06/06/11 1417  BP: 107/59 114/49 138/72 115/67  Pulse: 95 104 95 91  Temp: 98.3 F (36.8 C) 97.8 F (36.6 C) 98.3 F (36.8 C) 98.6 F (37 C)  TempSrc: Axillary   Axillary  Resp: 17 18 16 18   Height:      Weight:      SpO2: 99% 95% 95% 99%   Weight change:   Intake/Output Summary (Last 24 hours) at 06/06/11 1529 Last data filed at 06/06/11 0700  Gross per 24 hour  Intake    500 ml  Output    226 ml  Net    274 ml    Physical Exam: General: Awake, Oriented, No acute distress. HEENT: EOMI. Neck: Supple CV: S1 and S2 Lungs: Clear to ascultation bilaterally Abdomen: Soft, Nontender, Nondistended, +bowel sounds. Ext: Good pulses. Trace edema.  Lab Results:  Basename 06/06/11 0903 06/04/11 1627 06/04/11 0500  NA 142 -- 141  K 3.1* 3.5 --  CL 110 -- 115*  CO2 20 -- 20  GLUCOSE 275* -- 176*  BUN 8 -- 9  CREATININE 0.96 -- 1.02  CALCIUM 8.2* -- 7.1*  MG -- 1.8 --  PHOS -- -- --   No results found for this basename: AST:2,ALT:2,ALKPHOS:2,BILITOT:2,PROT:2,ALBUMIN:2 in the last 72 hours No results found for this basename: LIPASE:2,AMYLASE:2 in the last 72 hours  Basename 06/04/11 0500  WBC 13.1*  NEUTROABS --  HGB 9.9*  HCT 29.5*  MCV 91.3  PLT 166   No results found for this basename: CKTOTAL:3,CKMB:3,CKMBINDEX:3,TROPONINI:3 in the last 72 hours No components found with this basename: POCBNP:3 No results found for this basename: DDIMER:2 in the last 72 hours  Basename 06/06/11 0903  HGBA1C 13.0*   No results found for this basename: CHOL:2,HDL:2,LDLCALC:2,TRIG:2,CHOLHDL:2,LDLDIRECT:2 in the last 72 hours No results found for this basename: TSH,T4TOTAL,FREET3,T3FREE,THYROIDAB in the last 72 hours No results found for this basename:  VITAMINB12:2,FOLATE:2,FERRITIN:2,TIBC:2,IRON:2,RETICCTPCT:2 in the last 72 hours  Micro Results: Recent Results (from the past 240 hour(s))  CULTURE, BLOOD (ROUTINE X 2)     Status: Normal   Collection Time   05/29/11  5:30 PM      Component Value Range Status Comment   Specimen Description BLOOD FEMORAL ARTERY COLLECTED BY DOCTOR   Final    Special Requests BOTTLES DRAWN AEROBIC AND ANAEROBIC [facility]   Final    Setup Time [id number]   Final    Culture     Final    Value: NO GROWTH 5 DAYS     POSITIVE COCCI IN CLUSTERS Note: PREVIOUS REPORT GRAM Performed at [facility] CORRECTED RESULTS CALLED TO: KATRISE @ 5784 05/31/11 WICKN   Report Status 06/04/2011 FINAL   Final   URINE CULTURE     Status: Normal   Collection Time   05/29/11  5:50 PM      Component Value Range Status Comment   Specimen Description URINE, CATHETERIZED   Final    Special Requests NONE   Final    Setup Time [id number]   Final    Colony Count 80,000 COLONIES/ML   Final    Culture [facility] MORGANII   Final    Report Status 06/02/2011 FINAL   Final    Organism ID, Bacteria MORGANELLA MORGANII   Final   MRSA PCR SCREENING  Status: Normal   Collection Time   05/29/11  9:23 PM      Component Value Range Status Comment   MRSA by PCR NEGATIVE  NEGATIVE  Final   URINE CULTURE     Status: Normal   Collection Time   05/30/11 12:08 AM      Component Value Range Status Comment   Specimen Description URINE, CATHETERIZED   Final    Special Requests PATIENT ON FOLLOWING [facility]   Final    Setup Time [id number]   Final    Colony Count 70,000 COLONIES/ML   Final    Culture [facility] MORGANII   Final    Report Status 06/01/2011 FINAL   Final    Organism ID, Bacteria MORGANELLA MORGANII   Final     Studies/Results: No results found.  Medications: I have reviewed the patient's current medications. Scheduled Meds:   . heparin  5,000 Units Subcutaneous Q8H  . insulin aspart  0-20 Units Subcutaneous  TID WC  . insulin aspart  0-5 Units Subcutaneous QHS  . insulin aspart  4 Units Subcutaneous TID WC  . insulin aspart  5 Units Subcutaneous Once  . insulin glargine  28 Units Subcutaneous QHS  . DISCONTD: cefTRIAXone (ROCEPHIN)  IV  1 g Intravenous Q24H  . DISCONTD: insulin aspart  0-15 Units Subcutaneous TID WC  . DISCONTD: insulin glargine  26 Units Subcutaneous QHS   Continuous Infusions:  PRN Meds:.  Assessment/Plan: 1. DKA (diabetic ketoacidoses).  Hemoglobin A1c is 13.0 which indicates a mean glucose of 326.  Currently on Lantus 28 units subcutaneous each bedtime and NovoLog 4 units subcutaneous 3 times a day with meals.  2. Gram-positive bacteremia.  Lab corrected itself and now no growth to date.  3.  Hyperkalemia.  Resolved likely due to DKA.  4.  Hypokalemia.  Replace when necessary, suspect is likely due to insulin.  Magnesium checked on 06/04/2011 was normal.  Suspect the patient may need 3-4 days of potassium 20 mEq daily after discharge.  5. Acute renal failure.  Likely due to dehydration from severe diabetic ketoacidosis.  Resolved.  6. Hypernatremia in the setting of acute renal failure and dehydration.  Resolved.  7.  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  Sensitive to ceftriaxone, gentamicin, tobramycin.  Patient on antibiotics since 05/29/2011, patient has completed at least 9 day course of antibiotics.  Discontinue ceftriaxone.  8.  Prophylaxis.  Subcutaneous heparin.  9.  Disposition.  Will need home health PT/OT at discharge to assisted nursing facility, likely on 06/07/2011.   LOS: 8 days  Savahna Casados A, MD 06/06/2011, 3:29 PM

## 2011-06-06 NOTE — Plan of Care (Signed)
Problem: Phase I Progression Outcomes Goal: K+ level approaching normal with therapy Outcome: Progressing Received K+ for a serum K+ of 3.1

## 2011-06-07 LAB — GLUCOSE, CAPILLARY
Glucose-Capillary: 254 mg/dL — ABNORMAL HIGH (ref 70–99)
Glucose-Capillary: 306 mg/dL — ABNORMAL HIGH (ref 70–99)

## 2011-06-07 LAB — BASIC METABOLIC PANEL
BUN: 9 mg/dL (ref 6–23)
CO2: 28 mEq/L (ref 19–32)
Calcium: 8.8 mg/dL (ref 8.4–10.5)
Chloride: 100 mEq/L (ref 96–112)
Creatinine, Ser: 0.87 mg/dL (ref 0.50–1.35)
GFR calc Af Amer: >90 mL/min (ref >90)

## 2011-06-07 LAB — MAGNESIUM: Magnesium: 1.7 mg/dL (ref 1.5–2.5)

## 2011-06-07 MED ORDER — INSULIN ASPART 100 UNIT/ML ~~LOC~~ SOLN: 0.0000 [IU] | Freq: Three times a day (TID) | SUBCUTANEOUS | Status: DC

## 2011-06-07 MED ORDER — INSULIN ASPART 100 UNIT/ML ~~LOC~~ SOLN: 0.0000 [IU] | Freq: Every day | SUBCUTANEOUS | Status: DC

## 2011-06-07 MED ORDER — INSULIN ASPART 100 UNIT/ML ~~LOC~~ SOLN: 4.0000 [IU] | Freq: Three times a day (TID) | SUBCUTANEOUS | Status: DC

## 2011-06-07 MED ORDER — INSULIN GLARGINE 100 UNIT/ML ~~LOC~~ SOLN: 28.0000 [IU] | Freq: Every day | SUBCUTANEOUS | Status: DC

## 2011-06-07 NOTE — Progress Notes (Signed)
   CARE MANAGEMENT NOTE 06/07/2011  Patient:  [name], [name]   Account Number:  [id number]  Date Initiated:  05/31/2011  Documentation initiated by:  [facility]  Subjective/Objective Assessment:   Hyperglycemia, resp failure.     Action/Plan:   PT/OT evals-recommended HHPT and OT   Anticipated DC Date:  06/07/2011   Anticipated DC Plan:  ASSISTED LIVING / REST HOME  In-house referral  Clinical Social Worker      DC Planning Services  CM consult      Choice offered to / List presented to:  C-5 Sibling   DME arranged  WALKER - ROLLING      DME agency  [facility]     HH arranged  HH-2 PT  HH-3 OT      [facility] agency  [facility]   Status of service:  Completed, signed off Medicare Important Message given?   (If response is "NO", the following Medicare IM given date fields will be blank) Date Medicare IM given:   Date Additional Medicare IM given:    Discharge Disposition:  ASSISTED LIVING  Per UR Regulation:  Reviewed for med. necessity/level of care/duration of stay  Comments:  06/07/11 Spoke with patient and his sister about HHC. They chose [facility]gencies. Contacted Justin at [facility], rolling walker to be delivered to patient's room. Contacted  Pernella Ackerley Hickjling at [facility] and set up HHPT and HHOT. They will see patient at [facility] in [place]. Contacted [name] CSW and let her know that [facility] and walker were set up. Jacquelynn Cree RN, BSN, [place]   05-31-11 9:30am [name], RNBSN [phone] UR Completed.

## 2011-06-07 NOTE — Progress Notes (Signed)
Clinical Social Worker contacted pt facility, [facility]. Clinical Social Worker faxed [facility] pt updated FL-2 and other clinicals. Clinical Social Worker received phone call from [facility] stating that they were in route to the hospital to evaluate pt to determine if pt appropriate to return to ALF. [facility] stated that they would contact this Clinical Social Worker once evaluation completed.  Jacklynn Lewis, MSW, LCSWA  Clinical Social Work [phone]

## 2011-06-07 NOTE — Discharge Summary (Signed)
DISCHARGE SUMMARY  [name]  MR#: [id number]  DOB:Jan 03, 1956  Date of Admission: 05/29/2011 Date of Discharge: 06/07/2011  Attending Physician:Thurmond Hildebran  Patient's GMW:NUUVO,ZDGUYQIH J., MD, MD  Consults: -none  Discharge Diagnoses: Present on Admission:  .Hyperkalemia .DKA (diabetic ketoacidoses) .Renal failure .UTI (lower urinary tract infection)     Current Discharge Medication List    START taking these medications   Details  !! insulin aspart (NOVOLOG) 100 UNIT/ML injection Inject 0-20 Units into the skin 3 (three) times daily with meals. Qty: 1 vial, Refills: 2    !! insulin aspart (NOVOLOG) 100 UNIT/ML injection Inject 0-5 Units into the skin at bedtime. Qty: 1 vial, Refills: 2    !! insulin aspart (NOVOLOG) 100 UNIT/ML injection Inject 4 Units into the skin 3 (three) times daily with meals. Qty: 1 vial, Refills: 0    insulin glargine (LANTUS) 100 UNIT/ML injection Inject 28 Units into the skin at bedtime. Qty: 10 mL, Refills: 1     !! - Potential duplicate medications found. Please discuss with provider.    CONTINUE these medications which have NOT CHANGED   Details  fexofenadine (ALLEGRA) 180 MG tablet Take 180 mg by mouth daily.      olmesartan-hydrochlorothiazide (BENICAR HCT) 40-25 MG per tablet Take 1 tablet by mouth daily.        STOP taking these medications     ciprofloxacin (CIPRO) 500 MG tablet          Hospital Course: Brief Admission Note:   56 year-old gentleman with mental retardation and hypertension who has been ill for about a week, Admitted with DKA/HHS with BG in 1500 range. He was initially on PCCM service for evaluation and management of DKA, as his blood sugars were better controlled on iv insulin he was transferred to hospitalist service for further management. His hgba1c was found to be 13, he was hyperkalmic, hypernatremic and in acute renal failure.     Present on Admission:   .DKA (diabetic ketoacidoses):  Hemoglobin A1c is 13.0 which indicates a mean glucose of 326. Currently on Lantus 28 units subcutaneous each bedtime and NovoLog 4 units subcutaneous 3 times a day with meals.  2. Gram-positive bacteremia. Lab corrected itself and now no growth to date.  3. Hyperkalemia. Resolved likely due to DKA.  4. Hypokalemia. Replaced,  suspect is likely due to insulin. Magnesium checked on 06/04/2011 was normal.   5. Acute renal failure. Likely due to dehydration from severe diabetic ketoacidosis. Resolved.  . Hypernatremia in the setting of acute renal failure and dehydration. Resolved.   7. 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 Sensitive to ceftriaxone, gentamicin, tobramycin. Patient on antibiotics since 05/29/2011, patient has completed at least 9 day course of antibiotics. And we have Discontinued ceftriaxone yesterday.   PT/OT evaluation recommended discharge to ALF with home health PT.      Day of Discharge BP 122/70  Pulse 89  Temp(Src) 99.2 F (37.3 C) (Axillary)  Resp 16  Ht 5\' 11"  (1.803 m)  Wt 78.6 kg (173 lb 4.5 oz)  BMI 24.17 kg/m2  SpO2 97%  Physical Exam: Physical Exam:  General: Awake, Oriented, No acute distress.  HEENT: EOMI.  Neck: Supple  CV: S1 and S2  Lungs: Clear to ascultation bilaterally  Abdomen: Soft, Nontender, Nondistended, +bowel sounds.  Ext: Good pulses. Trace edema.   Results for orders placed during the hospital encounter of 05/29/11 (from the past 24 hour(s))  GLUCOSE, CAPILLARY     Status: Abnormal  Collection Time   06/06/11  4:18 PM      Component Value Range   Glucose-Capillary 330 (*) 70 - 99 (mg/dL)  GLUCOSE, CAPILLARY     Status: Abnormal   Collection Time   06/06/11  9:29 PM      Component Value Range   Glucose-Capillary 346 (*) 70 - 99 (mg/dL)  BASIC METABOLIC PANEL     Status: Abnormal   Collection Time   06/07/11  6:40 AM      Component Value Range   Sodium 138  135 - 145 (mEq/L)   Potassium 3.7  3.5 - 5.1  (mEq/L)   Chloride 100  96 - 112 (mEq/L)   CO2 28  19 - 32 (mEq/L)   Glucose, Bld 175 (*) 70 - 99 (mg/dL)   BUN 9  6 - 23 (mg/dL)   Creatinine, Ser 9.81  0.50 - 1.35 (mg/dL)   Calcium 8.8  8.4 - 19.1 (mg/dL)   GFR calc non Af Amer >90  >90 (mL/min)   GFR calc Af Amer >90  >90 (mL/min)  MAGNESIUM     Status: Normal   Collection Time   06/07/11  6:40 AM      Component Value Range   Magnesium 1.7  1.5 - 2.5 (mg/dL)  GLUCOSE, CAPILLARY     Status: Abnormal   Collection Time   06/07/11  7:00 AM      Component Value Range   Glucose-Capillary 169 (*) 70 - 99 (mg/dL)  GLUCOSE, CAPILLARY     Status: Abnormal   Collection Time   06/07/11 11:28 AM      Component Value Range   Glucose-Capillary 254 (*) 70 - 99 (mg/dL)   Comment 1 Documented in Chart     Comment 2 Notify RN      Disposition: DISCHARGE to ALF with HOME HEALTH PT.    Follow-up Appts: Discharge Orders    Future Orders Please Complete By Expires   Diet - low sodium heart healthy      Increase activity slowly      Discharge instructions      Comments:   Follow up with pcp in one to two weeks.      Follow-up with Madelin Rear. Fusco, MD, MD, in 1 to 2 weeks weeks.      Time spent in discharge (includes decision making & examination of pt): 43 minutes  Signed: Azure Barrales 06/07/2011, 2:40 PM

## 2011-06-07 NOTE — Progress Notes (Signed)
Physical Therapy Treatment Patient Details Name: MERIT MAYBEE MRN: [id number] DOB: 1955/06/12 Today's Date: 06/07/2011 Time: 4098-1191 Total Time:   PT Treatment Precautions/Restrictions  Precautions Precautions: Fall Restrictions Weight Bearing Restrictions: No Mobility (including Balance) Bed Mobility Bed Mobility: No Transfers Transfers: Yes Sit to Stand: 3: Mod assist;With upper extremity assist Sit to Stand Details (indicate cue type and reason): tactile cues for anterior weight shift Ambulation/Gait Ambulation/Gait Assistance: 4: Min assist Ambulation/Gait Assistance Details (indicate cue type and reason): min A for balance Assistive device: Rolling walker Gait Pattern: Shuffle;Trunk flexed  Exercise  Seated exercises to improve bilat LE strength: knee extensions, hip flexion, dorsiflexion End of Session PT - End of Session Equipment Utilized During Treatment: Gait belt Activity Tolerance: Patient tolerated treatment well;Patient limited by fatigue Patient left: in chair General Behavior During Session: [facility] for tasks performed Cognition: Impaired, at baseline  Gait ~77ft with RW min A for balance; gait included shuffled steps, flexed trunk and wide base of support.  Patient limited by fatigue and had to return to room after short gait.  Finished with seated exercises above for increased bilat LE strength.   Christianne Dolin 06/07/2011, 2:52 PM

## 2011-06-07 NOTE — Progress Notes (Signed)
Clinical Social Worker received phone call from [facility] stating that pt can return to ALF. Clinical Social Worker spoke with CM in regard to pt need for Home Health PT at facility. CM arranged Home Health Services. Clinical Social Worker facilitated pt discharge needs including contacting facility and spoke with pt family at bedside. Pt sister/guardian plans to transport pt to [facility] by car. Clinical Social Worker provide pt sister discharge packet to provide to facility. No further social work needs at this time. Clinical Social Worker signing off.  Jacklynn Lewis, MSW, LCSWA  Clinical Social Work [phone]

## 2011-06-09 DIAGNOSIS — B9689 Other specified bacterial agents as the cause of diseases classified elsewhere: Secondary | ICD-10-CM | POA: Diagnosis not present

## 2011-06-09 DIAGNOSIS — R269 Unspecified abnormalities of gait and mobility: Secondary | ICD-10-CM | POA: Diagnosis not present

## 2011-06-09 DIAGNOSIS — N390 Urinary tract infection, site not specified: Secondary | ICD-10-CM | POA: Diagnosis not present

## 2011-06-09 DIAGNOSIS — I10 Essential (primary) hypertension: Secondary | ICD-10-CM | POA: Diagnosis not present

## 2011-06-09 DIAGNOSIS — F79 Unspecified intellectual disabilities: Secondary | ICD-10-CM | POA: Diagnosis not present

## 2011-06-09 DIAGNOSIS — E1065 Type 1 diabetes mellitus with hyperglycemia: Secondary | ICD-10-CM | POA: Diagnosis not present

## 2011-06-11 DIAGNOSIS — I10 Essential (primary) hypertension: Secondary | ICD-10-CM | POA: Diagnosis not present

## 2011-06-11 DIAGNOSIS — R269 Unspecified abnormalities of gait and mobility: Secondary | ICD-10-CM | POA: Diagnosis not present

## 2011-06-11 DIAGNOSIS — F79 Unspecified intellectual disabilities: Secondary | ICD-10-CM | POA: Diagnosis not present

## 2011-06-11 DIAGNOSIS — E1065 Type 1 diabetes mellitus with hyperglycemia: Secondary | ICD-10-CM | POA: Diagnosis not present

## 2011-06-11 DIAGNOSIS — N390 Urinary tract infection, site not specified: Secondary | ICD-10-CM | POA: Diagnosis not present

## 2011-06-11 DIAGNOSIS — B9689 Other specified bacterial agents as the cause of diseases classified elsewhere: Secondary | ICD-10-CM | POA: Diagnosis not present

## 2011-06-12 ENCOUNTER — Emergency Department (HOSPITAL_COMMUNITY)
Admission: EM | Admit: 2011-06-12 | Discharge: 2011-06-12 | Disposition: A | Discharge status: Home / Self Care | Payer: Medicare Other | Attending: Emergency Medicine | Admitting: Emergency Medicine

## 2011-06-12 ENCOUNTER — Encounter (HOSPITAL_COMMUNITY): Payer: Self-pay | Admitting: Emergency Medicine

## 2011-06-12 DIAGNOSIS — E119 Type 2 diabetes mellitus without complications: Secondary | ICD-10-CM | POA: Insufficient documentation

## 2011-06-12 DIAGNOSIS — B369 Superficial mycosis, unspecified: Secondary | ICD-10-CM | POA: Insufficient documentation

## 2011-06-12 DIAGNOSIS — Z79899 Other long term (current) drug therapy: Secondary | ICD-10-CM | POA: Diagnosis not present

## 2011-06-12 DIAGNOSIS — R609 Edema, unspecified: Secondary | ICD-10-CM | POA: Diagnosis not present

## 2011-06-12 DIAGNOSIS — R21 Rash and other nonspecific skin eruption: Secondary | ICD-10-CM | POA: Insufficient documentation

## 2011-06-12 DIAGNOSIS — F79 Unspecified intellectual disabilities: Secondary | ICD-10-CM | POA: Diagnosis not present

## 2011-06-12 DIAGNOSIS — N509 Disorder of male genital organs, unspecified: Secondary | ICD-10-CM | POA: Insufficient documentation

## 2011-06-12 DIAGNOSIS — Z794 Long term (current) use of insulin: Secondary | ICD-10-CM | POA: Insufficient documentation

## 2011-06-12 DIAGNOSIS — R109 Unspecified abdominal pain: Secondary | ICD-10-CM | POA: Insufficient documentation

## 2011-06-12 DIAGNOSIS — B49 Unspecified mycosis: Secondary | ICD-10-CM | POA: Insufficient documentation

## 2011-06-12 DIAGNOSIS — I10 Essential (primary) hypertension: Secondary | ICD-10-CM | POA: Diagnosis not present

## 2011-06-12 HISTORY — DX: Hyperosmolality and hypernatremia: E87.0

## 2011-06-12 HISTORY — DX: Bacteremia: R78.81

## 2011-06-12 HISTORY — DX: Hypokalemia: E87.6

## 2011-06-12 HISTORY — DX: Sepsis, unspecified organism: A41.9

## 2011-06-12 HISTORY — DX: Disorder of kidney and ureter, unspecified: N28.9

## 2011-06-12 HISTORY — DX: Type 2 diabetes mellitus with ketoacidosis without coma: E11.10

## 2011-06-12 MED ORDER — TOLNAFTATE 1 % EX POWD: Freq: Two times a day (BID) | CUTANEOUS | Status: DC

## 2011-06-12 MED ORDER — TOLNAFTATE 1 % EX POWD: Freq: Two times a day (BID) | CUTANEOUS | Status: AC

## 2011-06-12 NOTE — Discharge Instructions (Signed)
Check and change the diaper every 2 hours, if wet. Cleanse the perineum and rash. Twice a day with mild soap, rinse and dry well; then apply Tinactin Powder to the rash. See your doctor in 2-3 days for a check up.  Fungus Infection of the Skin An infection of your skin caused by a fungus is a very common problem. Treatment depends on which part of the body is affected. Types of fungal skin infection include:  Athlete's Foot(Tinea pedis). This infection starts between the toes and may involve the entire sole and sides of foot. It is the most common fungal disease. It is made worse by heat, moisture, and friction. To treat, wash your feet 2 to 3 times daily. Dry thoroughly between the toes. Use medicated foot powder or cream as directed on the package. Plain talc, cornstarch, or rice powder may be dusted into socks and shoes to keep the feet dry. Wearing footwear that allows ventilation is also helpful.   Ringworm (Tinea corporis and tinea capitis). This infection causes scaly red rings to form on the skin or scalp. For skin sores, apply medicated lotion or cream as directed on the package. For the scalp, medicated shampoo may be used with with other therapies. Ringworm of the scalp or fingernails usually requires using oral medicine for 2 to 4 months.   Tinea versicolor. This infection appears as painless, scaly, patchy areas of discolored skin (whitish to light brown). It is more common in the summer and favors oily areas of the skin such as those found at the chest, abdomen, back, pubis, neck, and body folds. It can be treated with medicated shampoo or with medicated topical cream. Oral antifungals may be needed for more active infections. The light and/or dark spots may take time to get better and is not a sign of treatment failure.  Fungal infections may need to be treated for several weeks to be cured. It is important not to treat fungal infections with steroids or combination medicine that contains an  antifungal and steroid as these will make the fungal infection worse. SEEK MEDICAL CARE IF:   You have persistent itching or rawness.   You have an oral temperature above 102 F (38.9 C).  Document Released: 06/24/2004 Document Revised: 01/27/2011 Document Reviewed: 09/09/2009 [facility] Patient Information 2012 [place], [place].

## 2011-06-12 NOTE — ED Notes (Addendum)
Patient from [facility]. Per staff patient recently released from [facility] for DKA, when patient returned had rash with itching to scrotum. Per staff patient itching so much scrotum is now bleeding.

## 2011-06-12 NOTE — ED Provider Notes (Signed)
History    Scribed for Flint Melter, MD, the patient was seen in room APA19/APA19. This chart was scribed by Katha Cabal.   CSN: [id number]  Arrival date & time 06/12/11  1437   First MD Initiated Contact with Patient 06/12/11 1548      Chief Complaint  Patient presents with  . Testicle Pain  . Pruritis    (Consider location/radiation/quality/duration/timing/severity/associated sxs/prior treatment) Patient is a 56 y.o. male presenting with male genitourinary complaint. The history is provided by the patient and a relative. No language interpreter was used.  Male GU Problem Primary symptoms include genital rash and scrotal pain. This is a new problem. The problem occurs constantly. The problem has not changed since onset.Context: [facility] staff reported scrotum rash to family.  There has been no fever. Treatments tried: antibiotic ointment  The treatment provided no relief. Associated medical issues comments: DM, recent UTI/DKA/sepsis treatment.   Level 5 caveat applies for mental retardation.   Patient was discharged from hospital 6 nights ago-after treatment for DKA/sepsis/UTI.  Patient resides at [facility].  Family reports nursing staff stated patient's scrotum was bleeding.   Patient has been eating descently.   Patient blood glucose was 290 at noon today.  Patient taking Novolog.   Patient was taking antibiotics while in the hospital.  Patient was given an antibiotic ointment for a perineal rash-unclear when.    Past Medical History  Diagnosis Date  . MR (mental retardation)   . Hypertension   . UTI (urinary tract infection)   . Diabetes mellitus   . DKA (diabetic ketoacidoses)   . Gram-positive bacteremia   . Hypernatremia   . Sepsis   . Renal disorder   . Hypokalemia     History reviewed. No pertinent past surgical history.  History reviewed. No pertinent family history.  History  Substance Use Topics  . Smoking status: Never Smoker   . Smokeless tobacco:  Never Used  . Alcohol Use: No      Review of Systems  Unable to perform ROS: Other  Level 5 caveat applies for mental retardation.   Allergies  Review of patient's allergies indicates no known allergies.  Home Medications   Current Outpatient Rx  Name Route Sig Dispense Refill  . FEXOFENADINE HCL 180 MG PO TABS Oral Take 180 mg by mouth daily.      . INSULIN ASPART 100 UNIT/ML [place] SOLN Subcutaneous Inject 0-20 Units into the skin 3 (three) times daily with meals. 1 vial 2  . INSULIN ASPART 100 UNIT/ML [place] SOLN Subcutaneous Inject 0-5 Units into the skin at bedtime. 1 vial 2  . INSULIN ASPART 100 UNIT/ML [place] SOLN Subcutaneous Inject 4 Units into the skin 3 (three) times daily with meals. 1 vial 0  . INSULIN GLARGINE 100 UNIT/ML [place] SOLN Subcutaneous Inject 28 Units into the skin at bedtime. 10 mL 1  . OLMESARTAN MEDOXOMIL-HCTZ 40-25 MG PO TABS Oral Take 1 tablet by mouth daily.      . TOLNAFTATE 1 % EX POWD Topical Apply topically 2 (two) times daily. 45 g 0    BP 131/75  Pulse 100  Temp(Src) 98.3 F (36.8 C) (Oral)  Resp 20  Ht 5\' 8"  (1.727 m)  Wt 170 lb (77.111 kg)  BMI 25.85 kg/m2  SpO2 96%  Physical Exam  Nursing note and vitals reviewed. Constitutional: He appears well-developed and well-nourished.  HENT:  Head: Normocephalic and atraumatic.  Right Ear: External ear normal.  Left Ear: External ear  normal.  Mouth/Throat: Uvula is midline, oropharynx is clear and moist and mucous membranes are normal.  Eyes: Conjunctivae and EOM are normal. Pupils are equal, round, and reactive to light.  Neck: Normal range of motion and phonation normal. Neck supple.  Cardiovascular: Normal rate, regular rhythm, normal heart sounds and intact distal pulses.   Pulmonary/Chest: Effort normal and breath sounds normal. He exhibits no tenderness and no bony tenderness.  Abdominal: Soft. Normal appearance and bowel sounds are normal. There is no tenderness.       Mild diffuse abdominal  tenderness, no mass, no deformity,   Genitourinary:       The penis and testicles are atrophic  Musculoskeletal: Normal range of motion. He exhibits edema.       2+ pitting edema of lower legs, toes look good bilaterally,    Neurological: He is alert. He has normal strength. No cranial nerve deficit or sensory deficit. He exhibits normal muscle tone. Coordination normal.  Skin: Skin is warm, dry and intact.       Red slightly raised conflueent rash of the perieum that extends from the pubis to the buttocks   There is a small amount of bleeding in the medline of the scrotum; no associated blister, discharge, fluctuants, or deformity   Psychiatric: He has a normal mood and affect. His behavior is normal.    ED Course  Procedures (including critical care time)   DIAGNOSTIC STUDIES: Oxygen Saturation is 96% on room air, normal by my interpretation.     COORDINATION OF CARE: 3:54 PM  Spoke with [facility] staff regarding patient's blood glucose.      LABS / RADIOLOGY:   Labs Reviewed - No data to display No results found.       MDM  Rash, fungal in appearance in diabetic on recent aggressive ABX treatment. Doubt necrotizing faciitis, SBI, metabolic instability.   Plan: improve dryness with more frequent diaper changes, Use Tinactin Powder BID and see PCP in 2-3 days for reevaluation.        MEDICATIONS GIVEN IN THE E.D. Scheduled Meds:    . tolnaftate   Topical BID   Continuous Infusions:      IMPRESSION: 1. Fungal infection of skin         I personally performed the services described in this documentation, which was scribed in my presence. The recorded information has been reviewed and considered.  Scribe           Flint Melter, MD 06/12/11 1726

## 2011-06-15 DIAGNOSIS — F79 Unspecified intellectual disabilities: Secondary | ICD-10-CM | POA: Diagnosis not present

## 2011-06-15 DIAGNOSIS — R609 Edema, unspecified: Secondary | ICD-10-CM | POA: Diagnosis not present

## 2011-06-15 DIAGNOSIS — R269 Unspecified abnormalities of gait and mobility: Secondary | ICD-10-CM | POA: Diagnosis not present

## 2011-06-15 DIAGNOSIS — N390 Urinary tract infection, site not specified: Secondary | ICD-10-CM | POA: Diagnosis not present

## 2011-06-15 DIAGNOSIS — I10 Essential (primary) hypertension: Secondary | ICD-10-CM | POA: Diagnosis not present

## 2011-06-15 DIAGNOSIS — B9689 Other specified bacterial agents as the cause of diseases classified elsewhere: Secondary | ICD-10-CM | POA: Diagnosis not present

## 2011-06-15 DIAGNOSIS — E109 Type 1 diabetes mellitus without complications: Secondary | ICD-10-CM | POA: Diagnosis not present

## 2011-06-15 DIAGNOSIS — Z6829 Body mass index (BMI) 29.0-29.9, adult: Secondary | ICD-10-CM | POA: Diagnosis not present

## 2011-06-15 DIAGNOSIS — E1065 Type 1 diabetes mellitus with hyperglycemia: Secondary | ICD-10-CM | POA: Diagnosis not present

## 2011-06-16 DIAGNOSIS — F79 Unspecified intellectual disabilities: Secondary | ICD-10-CM | POA: Diagnosis not present

## 2011-06-16 DIAGNOSIS — I10 Essential (primary) hypertension: Secondary | ICD-10-CM | POA: Diagnosis not present

## 2011-06-16 DIAGNOSIS — E1065 Type 1 diabetes mellitus with hyperglycemia: Secondary | ICD-10-CM | POA: Diagnosis not present

## 2011-06-16 DIAGNOSIS — R269 Unspecified abnormalities of gait and mobility: Secondary | ICD-10-CM | POA: Diagnosis not present

## 2011-06-16 DIAGNOSIS — B9689 Other specified bacterial agents as the cause of diseases classified elsewhere: Secondary | ICD-10-CM | POA: Diagnosis not present

## 2011-06-16 DIAGNOSIS — N390 Urinary tract infection, site not specified: Secondary | ICD-10-CM | POA: Diagnosis not present

## 2011-06-17 DIAGNOSIS — I10 Essential (primary) hypertension: Secondary | ICD-10-CM | POA: Diagnosis not present

## 2011-06-17 DIAGNOSIS — F79 Unspecified intellectual disabilities: Secondary | ICD-10-CM | POA: Diagnosis not present

## 2011-06-17 DIAGNOSIS — N390 Urinary tract infection, site not specified: Secondary | ICD-10-CM | POA: Diagnosis not present

## 2011-06-17 DIAGNOSIS — E1065 Type 1 diabetes mellitus with hyperglycemia: Secondary | ICD-10-CM | POA: Diagnosis not present

## 2011-06-17 DIAGNOSIS — B9689 Other specified bacterial agents as the cause of diseases classified elsewhere: Secondary | ICD-10-CM | POA: Diagnosis not present

## 2011-06-17 DIAGNOSIS — R269 Unspecified abnormalities of gait and mobility: Secondary | ICD-10-CM | POA: Diagnosis not present

## 2011-06-18 DIAGNOSIS — R269 Unspecified abnormalities of gait and mobility: Secondary | ICD-10-CM | POA: Diagnosis not present

## 2011-06-18 DIAGNOSIS — N390 Urinary tract infection, site not specified: Secondary | ICD-10-CM | POA: Diagnosis not present

## 2011-06-18 DIAGNOSIS — F79 Unspecified intellectual disabilities: Secondary | ICD-10-CM | POA: Diagnosis not present

## 2011-06-18 DIAGNOSIS — I10 Essential (primary) hypertension: Secondary | ICD-10-CM | POA: Diagnosis not present

## 2011-06-18 DIAGNOSIS — B9689 Other specified bacterial agents as the cause of diseases classified elsewhere: Secondary | ICD-10-CM | POA: Diagnosis not present

## 2011-06-18 DIAGNOSIS — E1065 Type 1 diabetes mellitus with hyperglycemia: Secondary | ICD-10-CM | POA: Diagnosis not present

## 2011-06-19 DIAGNOSIS — E1065 Type 1 diabetes mellitus with hyperglycemia: Secondary | ICD-10-CM | POA: Diagnosis not present

## 2011-06-19 DIAGNOSIS — N390 Urinary tract infection, site not specified: Secondary | ICD-10-CM | POA: Diagnosis not present

## 2011-06-19 DIAGNOSIS — B9689 Other specified bacterial agents as the cause of diseases classified elsewhere: Secondary | ICD-10-CM | POA: Diagnosis not present

## 2011-06-19 DIAGNOSIS — I10 Essential (primary) hypertension: Secondary | ICD-10-CM | POA: Diagnosis not present

## 2011-06-19 DIAGNOSIS — F79 Unspecified intellectual disabilities: Secondary | ICD-10-CM | POA: Diagnosis not present

## 2011-06-19 DIAGNOSIS — R269 Unspecified abnormalities of gait and mobility: Secondary | ICD-10-CM | POA: Diagnosis not present

## 2011-06-20 DIAGNOSIS — N390 Urinary tract infection, site not specified: Secondary | ICD-10-CM | POA: Diagnosis not present

## 2011-06-20 DIAGNOSIS — F79 Unspecified intellectual disabilities: Secondary | ICD-10-CM | POA: Diagnosis not present

## 2011-06-20 DIAGNOSIS — B9689 Other specified bacterial agents as the cause of diseases classified elsewhere: Secondary | ICD-10-CM | POA: Diagnosis not present

## 2011-06-20 DIAGNOSIS — E1065 Type 1 diabetes mellitus with hyperglycemia: Secondary | ICD-10-CM | POA: Diagnosis not present

## 2011-06-20 DIAGNOSIS — I10 Essential (primary) hypertension: Secondary | ICD-10-CM | POA: Diagnosis not present

## 2011-06-20 DIAGNOSIS — R269 Unspecified abnormalities of gait and mobility: Secondary | ICD-10-CM | POA: Diagnosis not present

## 2011-06-21 DIAGNOSIS — N390 Urinary tract infection, site not specified: Secondary | ICD-10-CM | POA: Diagnosis not present

## 2011-06-21 DIAGNOSIS — E1065 Type 1 diabetes mellitus with hyperglycemia: Secondary | ICD-10-CM | POA: Diagnosis not present

## 2011-06-21 DIAGNOSIS — R269 Unspecified abnormalities of gait and mobility: Secondary | ICD-10-CM | POA: Diagnosis not present

## 2011-06-21 DIAGNOSIS — B9689 Other specified bacterial agents as the cause of diseases classified elsewhere: Secondary | ICD-10-CM | POA: Diagnosis not present

## 2011-06-21 DIAGNOSIS — F79 Unspecified intellectual disabilities: Secondary | ICD-10-CM | POA: Diagnosis not present

## 2011-06-21 DIAGNOSIS — I10 Essential (primary) hypertension: Secondary | ICD-10-CM | POA: Diagnosis not present

## 2011-06-22 DIAGNOSIS — E1065 Type 1 diabetes mellitus with hyperglycemia: Secondary | ICD-10-CM | POA: Diagnosis not present

## 2011-06-22 DIAGNOSIS — N390 Urinary tract infection, site not specified: Secondary | ICD-10-CM | POA: Diagnosis not present

## 2011-06-22 DIAGNOSIS — B9689 Other specified bacterial agents as the cause of diseases classified elsewhere: Secondary | ICD-10-CM | POA: Diagnosis not present

## 2011-06-22 DIAGNOSIS — F79 Unspecified intellectual disabilities: Secondary | ICD-10-CM | POA: Diagnosis not present

## 2011-06-22 DIAGNOSIS — R269 Unspecified abnormalities of gait and mobility: Secondary | ICD-10-CM | POA: Diagnosis not present

## 2011-06-22 DIAGNOSIS — I10 Essential (primary) hypertension: Secondary | ICD-10-CM | POA: Diagnosis not present

## 2011-06-24 DIAGNOSIS — I10 Essential (primary) hypertension: Secondary | ICD-10-CM | POA: Diagnosis not present

## 2011-06-24 DIAGNOSIS — N390 Urinary tract infection, site not specified: Secondary | ICD-10-CM | POA: Diagnosis not present

## 2011-06-24 DIAGNOSIS — E1065 Type 1 diabetes mellitus with hyperglycemia: Secondary | ICD-10-CM | POA: Diagnosis not present

## 2011-06-24 DIAGNOSIS — R269 Unspecified abnormalities of gait and mobility: Secondary | ICD-10-CM | POA: Diagnosis not present

## 2011-06-24 DIAGNOSIS — B9689 Other specified bacterial agents as the cause of diseases classified elsewhere: Secondary | ICD-10-CM | POA: Diagnosis not present

## 2011-06-24 DIAGNOSIS — F79 Unspecified intellectual disabilities: Secondary | ICD-10-CM | POA: Diagnosis not present

## 2011-06-29 DIAGNOSIS — Z6829 Body mass index (BMI) 29.0-29.9, adult: Secondary | ICD-10-CM | POA: Diagnosis not present

## 2011-06-29 DIAGNOSIS — I10 Essential (primary) hypertension: Secondary | ICD-10-CM | POA: Diagnosis not present

## 2011-06-29 DIAGNOSIS — E109 Type 1 diabetes mellitus without complications: Secondary | ICD-10-CM | POA: Diagnosis not present

## 2011-06-29 DIAGNOSIS — N390 Urinary tract infection, site not specified: Secondary | ICD-10-CM | POA: Diagnosis not present

## 2011-06-29 DIAGNOSIS — M810 Age-related osteoporosis without current pathological fracture: Secondary | ICD-10-CM | POA: Diagnosis not present

## 2011-07-28 DIAGNOSIS — B351 Tinea unguium: Secondary | ICD-10-CM | POA: Diagnosis not present

## 2011-07-28 DIAGNOSIS — M79609 Pain in unspecified limb: Secondary | ICD-10-CM | POA: Diagnosis not present

## 2011-07-31 DIAGNOSIS — I10 Essential (primary) hypertension: Secondary | ICD-10-CM | POA: Diagnosis not present

## 2011-07-31 DIAGNOSIS — E119 Type 2 diabetes mellitus without complications: Secondary | ICD-10-CM | POA: Diagnosis not present

## 2011-07-31 DIAGNOSIS — L219 Seborrheic dermatitis, unspecified: Secondary | ICD-10-CM | POA: Diagnosis not present

## 2011-07-31 DIAGNOSIS — E1149 Type 2 diabetes mellitus with other diabetic neurological complication: Secondary | ICD-10-CM | POA: Diagnosis not present

## 2011-08-05 ENCOUNTER — Ambulatory Visit (HOSPITAL_COMMUNITY)
Admission: RE | Admit: 2011-08-05 | Discharge: 2011-08-05 | Disposition: A | Discharge status: Home / Self Care | Payer: Medicare Other | Source: Ambulatory Visit | Attending: Urology | Admitting: Urology

## 2011-08-05 ENCOUNTER — Other Ambulatory Visit (HOSPITAL_COMMUNITY): Payer: Self-pay | Admitting: Urology

## 2011-08-05 DIAGNOSIS — N200 Calculus of kidney: Secondary | ICD-10-CM

## 2011-08-10 DIAGNOSIS — R799 Abnormal finding of blood chemistry, unspecified: Secondary | ICD-10-CM | POA: Diagnosis not present

## 2011-08-10 DIAGNOSIS — E119 Type 2 diabetes mellitus without complications: Secondary | ICD-10-CM | POA: Diagnosis not present

## 2011-08-19 DIAGNOSIS — E559 Vitamin D deficiency, unspecified: Secondary | ICD-10-CM | POA: Diagnosis not present

## 2011-08-19 DIAGNOSIS — R799 Abnormal finding of blood chemistry, unspecified: Secondary | ICD-10-CM | POA: Diagnosis not present

## 2011-08-19 DIAGNOSIS — E039 Hypothyroidism, unspecified: Secondary | ICD-10-CM | POA: Diagnosis not present

## 2011-08-19 DIAGNOSIS — E119 Type 2 diabetes mellitus without complications: Secondary | ICD-10-CM | POA: Diagnosis not present

## 2011-08-19 DIAGNOSIS — E785 Hyperlipidemia, unspecified: Secondary | ICD-10-CM | POA: Diagnosis not present

## 2011-09-14 DIAGNOSIS — E1149 Type 2 diabetes mellitus with other diabetic neurological complication: Secondary | ICD-10-CM | POA: Diagnosis not present

## 2011-09-14 DIAGNOSIS — L219 Seborrheic dermatitis, unspecified: Secondary | ICD-10-CM | POA: Diagnosis not present

## 2011-09-14 DIAGNOSIS — I10 Essential (primary) hypertension: Secondary | ICD-10-CM | POA: Diagnosis not present

## 2011-09-14 DIAGNOSIS — E119 Type 2 diabetes mellitus without complications: Secondary | ICD-10-CM | POA: Diagnosis not present

## 2011-10-05 DIAGNOSIS — E119 Type 2 diabetes mellitus without complications: Secondary | ICD-10-CM | POA: Diagnosis not present

## 2011-10-05 DIAGNOSIS — E039 Hypothyroidism, unspecified: Secondary | ICD-10-CM | POA: Diagnosis not present

## 2011-10-05 DIAGNOSIS — E878 Other disorders of electrolyte and fluid balance, not elsewhere classified: Secondary | ICD-10-CM | POA: Diagnosis not present

## 2011-10-05 DIAGNOSIS — E785 Hyperlipidemia, unspecified: Secondary | ICD-10-CM | POA: Diagnosis not present

## 2011-10-07 DIAGNOSIS — M25579 Pain in unspecified ankle and joints of unspecified foot: Secondary | ICD-10-CM | POA: Diagnosis not present

## 2011-10-07 DIAGNOSIS — M79609 Pain in unspecified limb: Secondary | ICD-10-CM | POA: Diagnosis not present

## 2011-10-21 DIAGNOSIS — E1149 Type 2 diabetes mellitus with other diabetic neurological complication: Secondary | ICD-10-CM | POA: Diagnosis not present

## 2011-10-21 DIAGNOSIS — I10 Essential (primary) hypertension: Secondary | ICD-10-CM | POA: Diagnosis not present

## 2011-10-21 DIAGNOSIS — E119 Type 2 diabetes mellitus without complications: Secondary | ICD-10-CM | POA: Diagnosis not present

## 2011-10-21 DIAGNOSIS — L219 Seborrheic dermatitis, unspecified: Secondary | ICD-10-CM | POA: Diagnosis not present

## 2011-11-18 DIAGNOSIS — M79609 Pain in unspecified limb: Secondary | ICD-10-CM | POA: Diagnosis not present

## 2011-11-18 DIAGNOSIS — M722 Plantar fascial fibromatosis: Secondary | ICD-10-CM | POA: Diagnosis not present

## 2011-11-18 DIAGNOSIS — L600 Ingrowing nail: Secondary | ICD-10-CM | POA: Diagnosis not present

## 2011-11-29 DIAGNOSIS — Z6828 Body mass index (BMI) 28.0-28.9, adult: Secondary | ICD-10-CM | POA: Diagnosis not present

## 2011-11-29 DIAGNOSIS — F72 Severe intellectual disabilities: Secondary | ICD-10-CM | POA: Diagnosis not present

## 2011-11-29 DIAGNOSIS — E119 Type 2 diabetes mellitus without complications: Secondary | ICD-10-CM | POA: Diagnosis not present

## 2011-11-29 DIAGNOSIS — I10 Essential (primary) hypertension: Secondary | ICD-10-CM | POA: Diagnosis not present

## 2012-01-27 DIAGNOSIS — E1149 Type 2 diabetes mellitus with other diabetic neurological complication: Secondary | ICD-10-CM | POA: Diagnosis not present

## 2012-01-27 DIAGNOSIS — E119 Type 2 diabetes mellitus without complications: Secondary | ICD-10-CM | POA: Diagnosis not present

## 2012-03-14 DIAGNOSIS — N390 Urinary tract infection, site not specified: Secondary | ICD-10-CM | POA: Diagnosis not present

## 2012-03-14 DIAGNOSIS — Z23 Encounter for immunization: Secondary | ICD-10-CM | POA: Diagnosis not present

## 2012-03-14 DIAGNOSIS — Z6828 Body mass index (BMI) 28.0-28.9, adult: Secondary | ICD-10-CM | POA: Diagnosis not present

## 2012-04-06 DIAGNOSIS — E1149 Type 2 diabetes mellitus with other diabetic neurological complication: Secondary | ICD-10-CM | POA: Diagnosis not present

## 2012-04-06 DIAGNOSIS — E119 Type 2 diabetes mellitus without complications: Secondary | ICD-10-CM | POA: Diagnosis not present

## 2012-06-06 ENCOUNTER — Ambulatory Visit: Payer: Medicare Other | Admitting: Urology

## 2012-06-27 DIAGNOSIS — N390 Urinary tract infection, site not specified: Secondary | ICD-10-CM | POA: Diagnosis not present

## 2012-07-04 ENCOUNTER — Ambulatory Visit (INDEPENDENT_AMBULATORY_CARE_PROVIDER_SITE_OTHER): Payer: Medicare Other | Admitting: Urology

## 2012-07-04 DIAGNOSIS — N200 Calculus of kidney: Secondary | ICD-10-CM

## 2012-07-04 DIAGNOSIS — N478 Other disorders of prepuce: Secondary | ICD-10-CM

## 2012-07-04 DIAGNOSIS — N323 Diverticulum of bladder: Secondary | ICD-10-CM | POA: Diagnosis not present

## 2012-07-04 DIAGNOSIS — N319 Neuromuscular dysfunction of bladder, unspecified: Secondary | ICD-10-CM

## 2012-07-04 DIAGNOSIS — N320 Bladder-neck obstruction: Secondary | ICD-10-CM | POA: Diagnosis not present

## 2012-07-04 DIAGNOSIS — N3020 Other chronic cystitis without hematuria: Secondary | ICD-10-CM | POA: Diagnosis not present

## 2012-08-04 ENCOUNTER — Inpatient Hospital Stay (HOSPITAL_COMMUNITY)
Admission: EM | Admit: 2012-08-04 | Discharge: 2012-08-08 | DRG: 603 | Disposition: A | Discharge status: Nursing Home (Includes ICF) | Payer: Medicare Other | Attending: General Surgery | Admitting: General Surgery

## 2012-08-04 ENCOUNTER — Emergency Department (HOSPITAL_COMMUNITY): Payer: Medicare Other

## 2012-08-04 ENCOUNTER — Encounter (HOSPITAL_COMMUNITY): Payer: Self-pay | Admitting: Emergency Medicine

## 2012-08-04 DIAGNOSIS — Z794 Long term (current) use of insulin: Secondary | ICD-10-CM

## 2012-08-04 DIAGNOSIS — F79 Unspecified intellectual disabilities: Secondary | ICD-10-CM | POA: Diagnosis present

## 2012-08-04 DIAGNOSIS — Z87442 Personal history of urinary calculi: Secondary | ICD-10-CM

## 2012-08-04 DIAGNOSIS — N289 Disorder of kidney and ureter, unspecified: Secondary | ICD-10-CM | POA: Diagnosis not present

## 2012-08-04 DIAGNOSIS — Z79899 Other long term (current) drug therapy: Secondary | ICD-10-CM | POA: Diagnosis not present

## 2012-08-04 DIAGNOSIS — L02219 Cutaneous abscess of trunk, unspecified: Secondary | ICD-10-CM | POA: Diagnosis not present

## 2012-08-04 DIAGNOSIS — R6889 Other general symptoms and signs: Secondary | ICD-10-CM | POA: Diagnosis not present

## 2012-08-04 DIAGNOSIS — I10 Essential (primary) hypertension: Secondary | ICD-10-CM | POA: Diagnosis not present

## 2012-08-04 DIAGNOSIS — R319 Hematuria, unspecified: Secondary | ICD-10-CM | POA: Diagnosis not present

## 2012-08-04 DIAGNOSIS — R059 Cough, unspecified: Secondary | ICD-10-CM | POA: Diagnosis not present

## 2012-08-04 DIAGNOSIS — E119 Type 2 diabetes mellitus without complications: Secondary | ICD-10-CM | POA: Diagnosis not present

## 2012-08-04 DIAGNOSIS — R05 Cough: Secondary | ICD-10-CM | POA: Diagnosis not present

## 2012-08-04 DIAGNOSIS — Z87440 Personal history of urinary (tract) infections: Secondary | ICD-10-CM

## 2012-08-04 LAB — CBC WITH DIFFERENTIAL/PLATELET
Basophils Absolute: 0 10*3/uL (ref 0.0–0.1)
Eosinophils Relative: 0 % (ref 0–5)
Lymphocytes Relative: 7 % — ABNORMAL LOW (ref 12–46)
MCV: 90.5 fL (ref 78.0–100.0)
Neutro Abs: 13.9 10*3/uL — ABNORMAL HIGH (ref 1.7–7.7)
Platelets: 318 10*3/uL (ref 150–400)
RDW: 12.8 % (ref 11.5–15.5)
WBC: 16.2 10*3/uL — ABNORMAL HIGH (ref 4.0–10.5)

## 2012-08-04 LAB — URINE MICROSCOPIC-ADD ON

## 2012-08-04 LAB — URINALYSIS, ROUTINE W REFLEX MICROSCOPIC
Leukocytes, UA: NEGATIVE
Protein, ur: 30 mg/dL — AB
Urobilinogen, UA: 0.2 mg/dL (ref 0.0–1.0)

## 2012-08-04 LAB — BASIC METABOLIC PANEL
CO2: 32 mEq/L (ref 19–32)
Calcium: 9.3 mg/dL (ref 8.4–10.5)
GFR calc Af Amer: 87 mL/min — ABNORMAL LOW (ref >90)
Sodium: 138 mEq/L (ref 135–145)

## 2012-08-04 MED ORDER — LACTATED RINGERS IV SOLN
INTRAVENOUS | Status: DC
Start: 2012-08-04 — End: 2012-08-08
  Administered 2012-08-04 – 2012-08-08 (×5): via INTRAVENOUS

## 2012-08-04 MED ORDER — BIOTENE DRY MOUTH MT LIQD
15.0000 mL | Freq: Two times a day (BID) | OROMUCOSAL | Status: DC
  Administered 2012-08-05 – 2012-08-07 (×6): 15 mL via OROMUCOSAL

## 2012-08-04 MED ORDER — SODIUM CHLORIDE 0.9 % IV SOLN
Freq: Once | INTRAVENOUS | Status: AC
  Administered 2012-08-04: 13:00:00 via INTRAVENOUS

## 2012-08-04 MED ORDER — LOSARTAN POTASSIUM-HCTZ 100-25 MG PO TABS: 1.0000 | ORAL_TABLET | Freq: Every day | ORAL | Status: DC

## 2012-08-04 MED ORDER — SODIUM CHLORIDE 0.9 % IV SOLN
3.0000 g | Freq: Once | INTRAVENOUS | Status: AC
  Administered 2012-08-04: 3 g via INTRAVENOUS
  Filled 2012-08-04: qty 3

## 2012-08-04 MED ORDER — HYDROCHLOROTHIAZIDE 25 MG PO TABS
25.0000 mg | ORAL_TABLET | Freq: Every day | ORAL | Status: DC
  Administered 2012-08-06 – 2012-08-08 (×3): 25 mg via ORAL
  Filled 2012-08-04 (×6): qty 1

## 2012-08-04 MED ORDER — ENOXAPARIN SODIUM 40 MG/0.4ML ~~LOC~~ SOLN
40.0000 mg | SUBCUTANEOUS | Status: DC
  Administered 2012-08-04 – 2012-08-07 (×4): 40 mg via SUBCUTANEOUS
  Filled 2012-08-04 (×4): qty 0.4

## 2012-08-04 MED ORDER — FUROSEMIDE 20 MG PO TABS
20.0000 mg | ORAL_TABLET | Freq: Three times a day (TID) | ORAL | Status: DC
  Administered 2012-08-04 – 2012-08-08 (×12): 20 mg via ORAL
  Filled 2012-08-04 (×12): qty 1

## 2012-08-04 MED ORDER — INSULIN ASPART 100 UNIT/ML ~~LOC~~ SOLN: 0.0000 [IU] | Freq: Three times a day (TID) | SUBCUTANEOUS | Status: DC

## 2012-08-04 MED ORDER — MORPHINE SULFATE 2 MG/ML IJ SOLN
2.0000 mg | INTRAMUSCULAR | Status: DC | PRN
  Administered 2012-08-07 (×2): 2 mg via INTRAVENOUS
  Filled 2012-08-04 (×2): qty 1

## 2012-08-04 MED ORDER — POTASSIUM CHLORIDE CRYS ER 20 MEQ PO TBCR
20.0000 meq | EXTENDED_RELEASE_TABLET | Freq: Every day | ORAL | Status: DC
  Administered 2012-08-04: 20 meq via ORAL
  Filled 2012-08-04 (×2): qty 1

## 2012-08-04 MED ORDER — SODIUM CHLORIDE 0.9 % IV SOLN
3.0000 g | Freq: Four times a day (QID) | INTRAVENOUS | Status: DC
  Administered 2012-08-05 – 2012-08-08 (×15): 3 g via INTRAVENOUS
  Filled 2012-08-04 (×19): qty 3

## 2012-08-04 MED ORDER — PANTOPRAZOLE SODIUM 40 MG IV SOLR
40.0000 mg | Freq: Every day | INTRAVENOUS | Status: DC
  Administered 2012-08-04: 40 mg via INTRAVENOUS
  Filled 2012-08-04: qty 40

## 2012-08-04 MED ORDER — LOSARTAN POTASSIUM 50 MG PO TABS
100.0000 mg | ORAL_TABLET | Freq: Every day | ORAL | Status: DC
  Administered 2012-08-06 – 2012-08-08 (×3): 100 mg via ORAL
  Filled 2012-08-04 (×6): qty 2

## 2012-08-04 MED ORDER — IRBESARTAN 300 MG PO TABS
300.0000 mg | ORAL_TABLET | Freq: Every day | ORAL | Status: DC
  Administered 2012-08-06 – 2012-08-07 (×2): 300 mg via ORAL
  Filled 2012-08-04 (×4): qty 1

## 2012-08-04 MED ORDER — ENOXAPARIN SODIUM 40 MG/0.4ML ~~LOC~~ SOLN: 40.0000 mg | SUBCUTANEOUS | Status: DC

## 2012-08-04 MED ORDER — HYDROCHLOROTHIAZIDE 25 MG PO TABS
25.0000 mg | ORAL_TABLET | Freq: Every day | ORAL | Status: DC
  Administered 2012-08-06 – 2012-08-07 (×2): 25 mg via ORAL
  Filled 2012-08-04 (×4): qty 1

## 2012-08-04 MED ORDER — ONDANSETRON HCL 4 MG/2ML IJ SOLN: 4.0000 mg | Freq: Four times a day (QID) | INTRAMUSCULAR | Status: DC | PRN

## 2012-08-04 MED ORDER — SODIUM CHLORIDE 0.9 % IV SOLN: 3.0000 g | Freq: Four times a day (QID) | INTRAVENOUS | Status: DC

## 2012-08-04 MED ORDER — INSULIN ASPART 100 UNIT/ML ~~LOC~~ SOLN
0.0000 [IU] | Freq: Every day | SUBCUTANEOUS | Status: DC
  Administered 2012-08-04: 3 [IU] via SUBCUTANEOUS
  Administered 2012-08-07: 2 [IU] via SUBCUTANEOUS

## 2012-08-04 MED ORDER — INSULIN GLARGINE 100 UNIT/ML ~~LOC~~ SOLN
28.0000 [IU] | Freq: Every day | SUBCUTANEOUS | Status: DC
Start: 2012-08-04 — End: 2012-08-04

## 2012-08-04 MED ORDER — INSULIN GLARGINE 100 UNIT/ML ~~LOC~~ SOLN
28.0000 [IU] | Freq: Every day | SUBCUTANEOUS | Status: DC
  Administered 2012-08-04 – 2012-08-07 (×4): 28 [IU] via SUBCUTANEOUS

## 2012-08-04 MED ORDER — INSULIN ASPART 100 UNIT/ML ~~LOC~~ SOLN
0.0000 [IU] | Freq: Three times a day (TID) | SUBCUTANEOUS | Status: DC
  Administered 2012-08-05 – 2012-08-06 (×3): 3 [IU] via SUBCUTANEOUS
  Administered 2012-08-07: 5 [IU] via SUBCUTANEOUS
  Administered 2012-08-07 – 2012-08-08 (×3): 2 [IU] via SUBCUTANEOUS
  Administered 2012-08-08: 3 [IU] via SUBCUTANEOUS

## 2012-08-04 MED ORDER — OLMESARTAN MEDOXOMIL-HCTZ 40-25 MG PO TABS: 1.0000 | ORAL_TABLET | Freq: Every day | ORAL | Status: DC

## 2012-08-04 MED ORDER — SODIUM CHLORIDE 0.9 % IV SOLN
INTRAVENOUS | Status: AC
  Filled 2012-08-04 (×2): qty 3

## 2012-08-04 NOTE — ED Notes (Signed)
Pt was cleaned and changed. Pt also had I/O for urine sample. Pt was unable to void on own. 1500cc urine drained. Pt has large abscess to rt buttocks. Purulent drainage present to wound and hot and redness noted as well. Tammy Triplett PA made aware.

## 2012-08-04 NOTE — ED Provider Notes (Signed)
Shelda Jakes, MD  Medical screening examination/treatment/procedure(s) were conducted as a shared visit with non-physician practitioner(s) and myself.  I personally evaluated the patient during the encounter  Patient with a large perineal abscess not involving the scrotum. Patient also has history of diabetes blood sugars around 280 and potassium is low. We'll discuss with Gen. surgery about the consultation for probable I&D suspect this is going need to be done in the emergency department. May very well require a medical mission to correct the blood sugar in the electrolytes prior to surgery. Will start Unasyn as antibiotic.    Shelda Jakes, MD 08/04/12 [phone]

## 2012-08-04 NOTE — ED Notes (Signed)
Pt was rolled to his left side at this time to clean by amanda rn and lisa and pt has a large red area that feels warm to touch and purulent drainage noted Tammy pa aware of same. lisa

## 2012-08-04 NOTE — ED Provider Notes (Signed)
History     CSN: [id number]  Arrival date & time 08/04/12  1152   First MD Initiated Contact with Patient 08/04/12 1219      Chief Complaint  Patient presents with  . Hematuria    (Consider location/radiation/quality/duration/timing/severity/associated sxs/prior treatment) HPI Comments: Patient with hx of diabetes, MR and frequent UTI's who lives at a local assisted living facility, was sent to the ED for evaluation of an an episode of hematuria and decreased appetite for 2-3 days.  Sister of the patient states that he has hx of kidney stones and was seen by urology 2-3 weeks ago.  Sister denies fever, chills, vomiting, or diarrhea.  Patient denies abdominal pain or dysuria.    Patient is a 57 y.o. male presenting with hematuria. The history is provided by the patient and a relative. The history is limited by a developmental delay (patient has hx of MR).  Hematuria This is a new problem. The current episode started today. Episode frequency: a single episode. The problem has been unchanged. Pertinent negatives include no abdominal pain, arthralgias, change in bowel habit, chest pain, fever, headaches, nausea, neck pain, numbness, rash, sore throat, swollen glands, urinary symptoms, visual change, vomiting or weakness. Nothing aggravates the symptoms. He has tried nothing for the symptoms. The treatment provided no relief.    Past Medical History  Diagnosis Date  . MR (mental retardation)   . Hypertension   . UTI (urinary tract infection)   . Diabetes mellitus   . DKA (diabetic ketoacidoses)   . Gram-positive bacteremia   . Hypernatremia   . Sepsis(995.91)   . Renal disorder   . Hypokalemia     History reviewed. No pertinent past surgical history.  No family history on file.  History  Substance Use Topics  . Smoking status: Never Smoker   . Smokeless tobacco: Never Used  . Alcohol Use: No      Review of Systems  Constitutional: Positive for appetite change. Negative for  fever and activity change.  HENT: Negative for sore throat, facial swelling, trouble swallowing, neck pain and neck stiffness.   Respiratory: Negative for chest tightness, shortness of breath and stridor.   Cardiovascular: Negative for chest pain.  Gastrointestinal: Negative for nausea, vomiting, abdominal pain, diarrhea, constipation and change in bowel habit.  Genitourinary: Positive for hematuria. Negative for dysuria, frequency and difficulty urinating.  Musculoskeletal: Negative for arthralgias.  Skin: Negative for color change and rash.  Neurological: Negative for dizziness, seizures, syncope, speech difficulty, weakness, numbness and headaches.  All other systems reviewed and are negative.    Allergies  Review of patient's allergies indicates no known allergies.  Home Medications   Current Outpatient Rx  Name  Route  Sig  Dispense  Refill  . fexofenadine (ALLEGRA) 180 MG tablet   Oral   Take 180 mg by mouth daily.           . insulin aspart (NOVOLOG) 100 UNIT/ML injection   Subcutaneous   Inject 0-20 Units into the skin 3 (three) times daily with meals.         . insulin glargine (LANTUS) 100 UNIT/ML injection   Subcutaneous   Inject 28 Units into the skin at bedtime.         Marland Kitchen olmesartan-hydrochlorothiazide (BENICAR HCT) 40-25 MG per tablet   Oral   Take 1 tablet by mouth daily.             BP 121/85  Pulse 110  SpO2 99%  Physical Exam  Nursing note and vitals reviewed. Constitutional: He is oriented to person, place, and time. He appears well-developed and well-nourished. No distress.  HENT:  Head: Normocephalic and atraumatic.  Mouth/Throat: Oropharynx is clear and moist. No oropharyngeal exudate.  Neck: Normal range of motion. Neck supple.  Cardiovascular: Normal rate, regular rhythm, normal heart sounds and intact distal pulses.   No murmur heard. Pulmonary/Chest: Effort normal and breath sounds normal. No respiratory distress. He exhibits no  tenderness.  Abdominal: Soft. He exhibits no distension. There is no tenderness. There is no rebound and no guarding.  Genitourinary:     Large, indurated abscess with mild drainage to the right perineum between the scrotum and anus.  The scrotum does not appear to be invloved.    Musculoskeletal: Normal range of motion. He exhibits no edema and no tenderness.  Neurological: He is alert and oriented to person, place, and time. He exhibits normal muscle tone. Coordination normal.  Skin: Skin is warm and dry.    ED Course  Procedures (including critical care time)  Labs Reviewed - No data to display No results found.  Results for orders placed during the hospital encounter of 08/04/12  CBC WITH DIFFERENTIAL      Result Value Range   WBC 16.2 (*) 4.0 - 10.5 K/uL   RBC 4.02 (*) 4.22 - 5.81 MIL/uL   Hemoglobin 12.5 (*) 13.0 - 17.0 g/dL   HCT 21.3 (*) 08.6 - 57.8 %   MCV 90.5  78.0 - 100.0 fL   MCH 31.1  26.0 - 34.0 pg   MCHC 34.3  30.0 - 36.0 g/dL   RDW 46.9  62.9 - 52.8 %   Platelets 318  150 - 400 K/uL   Neutrophils Relative 85 (*) 43 - 77 %   Neutro Abs 13.9 (*) 1.7 - 7.7 K/uL   Lymphocytes Relative 7 (*) 12 - 46 %   Lymphs Abs 1.1  0.7 - 4.0 K/uL   Monocytes Relative 8  3 - 12 %   Monocytes Absolute 1.3 (*) 0.1 - 1.0 K/uL   Eosinophils Relative 0  0 - 5 %   Eosinophils Absolute 0.0  0.0 - 0.7 K/uL   Basophils Relative 0  0 - 1 %   Basophils Absolute 0.0  0.0 - 0.1 K/uL  BASIC METABOLIC PANEL      Result Value Range   Sodium 138  135 - 145 mEq/L   Potassium 2.8 (*) 3.5 - 5.1 mEq/L   Chloride 95 (*) 96 - 112 mEq/L   CO2 32  19 - 32 mEq/L   Glucose, Bld 280 (*) 70 - 99 mg/dL   BUN 23  6 - 23 mg/dL   Creatinine, Ser 4.13  0.50 - 1.35 mg/dL   Calcium 9.3  8.4 - 24.4 mg/dL   GFR calc non Af Amer 75 (*) >90 mL/min   GFR calc Af Amer 87 (*) >90 mL/min    Dg Chest 1 View  08/04/2012  *RADIOLOGY REPORT*  Clinical Data: Cough  CHEST - 1 VIEW  Comparison:  05/31/2011   Findings: The heart size and mediastinal contours are within normal limits.  Both lungs are clear.  IMPRESSION: No active disease.   Original Report Authenticated By: Judie Petit. Shick, M.D.     MDM    Pt with hx of MR, non-toxic appearing.  Abdomen is soft, NT,  Patient unable to void, stating his did not need to void despite receiving IVF's, in and out cath ordered.  I  was informed by nursing staff after the cath, that patient has a large red, indurated area to the right perineum.    Pt then seen by Dr. Deretha Emory as well.  Will consult Dr. Caesar Bookman regarding I&D of abscess to the right perineum.  Unasyn ordered.    Consulted Dr. Caesar Bookman, will admit patient for probable I&D  Tammy L. Trisha Mangle, PA-C 08/04/12 1810

## 2012-08-04 NOTE — ED Notes (Signed)
Pt sent to ED by assisted living facility for hematuria and failure to thrive-staff states pt refusing to eat/drink x 3 days. Also report URI.

## 2012-08-04 NOTE — ED Provider Notes (Signed)
Medical screening examination/treatment/procedure(s) were conducted as a shared visit with non-physician practitioner(s) and myself.  I personally evaluated the patient during the encounter  Shelda Jakes, MD 08/04/12 [phone]

## 2012-08-04 NOTE — Progress Notes (Signed)
Pt is mentally retarded instructed with incentive but it is doubtful he will be able to use Incentive

## 2012-08-05 LAB — GLUCOSE, CAPILLARY
Glucose-Capillary: 122 mg/dL — ABNORMAL HIGH (ref 70–99)
Glucose-Capillary: 159 mg/dL — ABNORMAL HIGH (ref 70–99)

## 2012-08-05 LAB — CBC
HCT: 32.9 % — ABNORMAL LOW (ref 39.0–52.0)
Hemoglobin: 11.1 g/dL — ABNORMAL LOW (ref 13.0–17.0)
MCH: 30.8 pg (ref 26.0–34.0)
MCHC: 33.7 g/dL (ref 30.0–36.0)
MCV: 91.4 fL (ref 78.0–100.0)

## 2012-08-05 LAB — BASIC METABOLIC PANEL
BUN: 16 mg/dL (ref 6–23)
CO2: 31 mEq/L (ref 19–32)
Chloride: 101 mEq/L (ref 96–112)
GFR calc non Af Amer: 82 mL/min — ABNORMAL LOW (ref >90)
Glucose, Bld: 118 mg/dL — ABNORMAL HIGH (ref 70–99)
Potassium: 2.9 mEq/L — ABNORMAL LOW (ref 3.5–5.1)

## 2012-08-05 MED ORDER — POTASSIUM CHLORIDE CRYS ER 20 MEQ PO TBCR
40.0000 meq | EXTENDED_RELEASE_TABLET | Freq: Two times a day (BID) | ORAL | Status: DC
  Administered 2012-08-05 – 2012-08-08 (×7): 40 meq via ORAL
  Filled 2012-08-05: qty 1
  Filled 2012-08-05: qty 2
  Filled 2012-08-05: qty 1
  Filled 2012-08-05: qty 2
  Filled 2012-08-05: qty 1
  Filled 2012-08-05: qty 2
  Filled 2012-08-05: qty 1
  Filled 2012-08-05 (×2): qty 2

## 2012-08-05 MED ORDER — PANTOPRAZOLE SODIUM 40 MG PO TBEC
40.0000 mg | DELAYED_RELEASE_TABLET | Freq: Every day | ORAL | Status: DC
  Administered 2012-08-05 – 2012-08-08 (×4): 40 mg via ORAL
  Filled 2012-08-05 (×4): qty 1

## 2012-08-05 NOTE — H&P (Signed)
[name] is an 57 y.o. male.   Chief Complaint: Perineal abscess HPI: Patient poor historian but no current complaint of pain.  NO Nausea.  Tolerating PO.  2-3 days of drainage.    Past Medical History  Diagnosis Date  . MR (mental retardation)   . Hypertension   . UTI (urinary tract infection)   . Diabetes mellitus   . DKA (diabetic ketoacidoses)   . Gram-positive bacteremia   . Hypernatremia   . Sepsis(995.91)   . Renal disorder   . Hypokalemia     History reviewed. No pertinent past surgical history.  No family history on file. Social History:  reports that he has never smoked. He has never used smokeless tobacco. He reports that he does not drink alcohol or use illicit drugs.  Allergies: No Known Allergies  Medications Prior to Admission  Medication Sig Dispense Refill  . Cholecalciferol 1000 UNITS capsule Take 1,000 Units by mouth daily.      . fexofenadine (ALLEGRA) 180 MG tablet Take 180 mg by mouth daily.        . furosemide (LASIX) 20 MG tablet Take 20 mg by mouth 3 (three) times daily.      . insulin aspart (NOVOLOG) 100 UNIT/ML injection Inject 0-20 Units into the skin 3 (three) times daily with meals.      . insulin glargine (LANTUS) 100 UNIT/ML injection Inject 28 Units into the skin at bedtime.      Marland Kitchen ketoconazole (NIZORAL) 2 % shampoo Apply 1 application topically 2 (two) times a week.      . losartan-hydrochlorothiazide (HYZAAR) 100-25 MG per tablet Take 1 tablet by mouth daily.      Marland Kitchen olmesartan-hydrochlorothiazide (BENICAR HCT) 40-25 MG per tablet Take 1 tablet by mouth daily.        . potassium chloride SA (K-DUR,KLOR-CON) 20 MEQ tablet Take 20 mEq by mouth daily.        Results for orders placed during the hospital encounter of 08/04/12 (from the past 48 hour(s))  CBC WITH DIFFERENTIAL     Status: Abnormal   Collection Time    08/04/12  1:08 PM      Result Value Range   WBC 16.2 (*) 4.0 - 10.5 K/uL   RBC 4.02 (*) 4.22 - 5.81 MIL/uL   Hemoglobin  12.5 (*) 13.0 - 17.0 g/dL   HCT 16.1 (*) 09.6 - 04.5 %   MCV 90.5  78.0 - 100.0 fL   MCH 31.1  26.0 - 34.0 pg   MCHC 34.3  30.0 - 36.0 g/dL   RDW 40.9  81.1 - 91.4 %   Platelets 318  150 - 400 K/uL   Neutrophils Relative 85 (*) 43 - 77 %   Neutro Abs 13.9 (*) 1.7 - 7.7 K/uL   Lymphocytes Relative 7 (*) 12 - 46 %   Lymphs Abs 1.1  0.7 - 4.0 K/uL   Monocytes Relative 8  3 - 12 %   Monocytes Absolute 1.3 (*) 0.1 - 1.0 K/uL   Eosinophils Relative 0  0 - 5 %   Eosinophils Absolute 0.0  0.0 - 0.7 K/uL   Basophils Relative 0  0 - 1 %   Basophils Absolute 0.0  0.0 - 0.1 K/uL  BASIC METABOLIC PANEL     Status: Abnormal   Collection Time    08/04/12  1:08 PM      Result Value Range   Sodium 138  135 - 145 mEq/L   Potassium 2.8 (*)  3.5 - 5.1 mEq/L   Chloride 95 (*) 96 - 112 mEq/L   CO2 32  19 - 32 mEq/L   Glucose, Bld 280 (*) 70 - 99 mg/dL   BUN 23  6 - 23 mg/dL   Creatinine, Ser 4.09  0.50 - 1.35 mg/dL   Calcium 9.3  8.4 - 81.1 mg/dL   GFR calc non Af Amer 75 (*) >90 mL/min   GFR calc Af Amer 87 (*) >90 mL/min   Comment:            The eGFR has been calculated     using the CKD EPI equation.     This calculation has not been     validated in all clinical     situations.     eGFR's persistently     <90 mL/min signify     possible Chronic Kidney Disease.  GLUCOSE, CAPILLARY     Status: Abnormal   Collection Time    08/04/12  4:45 PM      Result Value Range   Glucose-Capillary 152 (*) 70 - 99 mg/dL  URINALYSIS, ROUTINE W REFLEX MICROSCOPIC     Status: Abnormal   Collection Time    08/04/12  4:52 PM      Result Value Range   Color, Urine YELLOW  YELLOW   APPearance CLEAR  CLEAR   Specific Gravity, Urine 1.010  1.005 - 1.030   pH 7.0  5.0 - 8.0   Glucose, UA 250 (*) NEGATIVE mg/dL   Hgb urine dipstick TRACE (*) NEGATIVE   Bilirubin Urine NEGATIVE  NEGATIVE   Ketones, ur NEGATIVE  NEGATIVE mg/dL   Protein, ur 30 (*) NEGATIVE mg/dL   Urobilinogen, UA 0.2  0.0 - 1.0 mg/dL    Nitrite NEGATIVE  NEGATIVE   Leukocytes, UA NEGATIVE  NEGATIVE  URINE MICROSCOPIC-ADD ON     Status: Abnormal   Collection Time    08/04/12  4:52 PM      Result Value Range   WBC, UA 3-6  <3 WBC/hpf   RBC / HPF 0-2  <3 RBC/hpf   Bacteria, UA RARE  RARE   Casts HYALINE CASTS (*) NEGATIVE  GLUCOSE, CAPILLARY     Status: Abnormal   Collection Time    08/04/12  9:15 PM      Result Value Range   Glucose-Capillary 252 (*) 70 - 99 mg/dL   Comment 1 Notify RN     Comment 2 Documented in Chart    BASIC METABOLIC PANEL     Status: Abnormal   Collection Time    08/05/12  5:53 AM      Result Value Range   Sodium 142  135 - 145 mEq/L   Potassium 2.9 (*) 3.5 - 5.1 mEq/L   Chloride 101  96 - 112 mEq/L   CO2 31  19 - 32 mEq/L   Glucose, Bld 118 (*) 70 - 99 mg/dL   BUN 16  6 - 23 mg/dL   Creatinine, Ser 9.14  0.50 - 1.35 mg/dL   Calcium 8.7  8.4 - 78.2 mg/dL   GFR calc non Af Amer 82 (*) >90 mL/min   GFR calc Af Amer >90  >90 mL/min   Comment:            The eGFR has been calculated     using the CKD EPI equation.     This calculation has not been     validated in all clinical  situations.     eGFR's persistently     <90 mL/min signify     possible Chronic Kidney Disease.  CBC     Status: Abnormal   Collection Time    08/05/12  5:53 AM      Result Value Range   WBC 13.0 (*) 4.0 - 10.5 K/uL   RBC 3.60 (*) 4.22 - 5.81 MIL/uL   Hemoglobin 11.1 (*) 13.0 - 17.0 g/dL   HCT 16.1 (*) 09.6 - 04.5 %   MCV 91.4  78.0 - 100.0 fL   MCH 30.8  26.0 - 34.0 pg   MCHC 33.7  30.0 - 36.0 g/dL   RDW 40.9  81.1 - 91.4 %   Platelets 320  150 - 400 K/uL  GLUCOSE, CAPILLARY     Status: Abnormal   Collection Time    08/05/12  7:52 AM      Result Value Range   Glucose-Capillary 122 (*) 70 - 99 mg/dL   Comment 1 Documented in Chart     Comment 2 Notify RN    GLUCOSE, CAPILLARY     Status: Abnormal   Collection Time    08/05/12 11:57 AM      Result Value Range   Glucose-Capillary 159 (*) 70 - 99  mg/dL   Comment 1 Notify RN    GLUCOSE, CAPILLARY     Status: Abnormal   Collection Time    08/05/12  4:13 PM      Result Value Range   Glucose-Capillary 103 (*) 70 - 99 mg/dL   Comment 1 Documented in Chart     Comment 2 Notify RN     Dg Chest 1 View  08/04/2012  *RADIOLOGY REPORT*  Clinical Data: Cough  CHEST - 1 VIEW  Comparison:  05/31/2011  Findings: The heart size and mediastinal contours are within normal limits.  Both lungs are clear.  IMPRESSION: No active disease.   Original Report Authenticated By: Judie Petit. Miles Costain, M.D.     Review of Systems  Unable to perform ROS: mental acuity    Blood pressure 104/67, pulse 101, temperature 99.7 F (37.6 C), temperature source Oral, resp. rate 20, height 5\' 8"  (1.727 m), weight 69.3 kg (152 lb 12.5 oz), SpO2 96.00%. Physical Exam  Constitutional: He is oriented to person, place, and time. He appears well-developed and well-nourished.  HENT:  Head: Normocephalic and atraumatic.  Eyes: Conjunctivae and EOM are normal. Pupils are equal, round, and reactive to light. No scleral icterus.  Neck: Normal range of motion. Neck supple. No thyromegaly present.  Cardiovascular: Normal rate, regular rhythm and normal heart sounds.   GI: Soft. Bowel sounds are normal. He exhibits no distension. There is no tenderness.  Perineal abscess.  Some drainage.  Some erythema  Lymphadenopathy:    He has no cervical adenopathy.  Neurological: He is alert and oriented to person, place, and time.  Skin: Skin is warm and dry.     Assessment/Plan Perineal abscess.  Continue IV abx.  Continue warm compresses  Possible drainage tomorrow.    ZIEGLER,BRENT C 08/05/2012, 7:32 PM

## 2012-08-05 NOTE — Progress Notes (Signed)
The patient is receiving Protonix by the intravenous route.  Based on criteria approved by the Pharmacy and Therapeutics Committee and the Medical Executive Committee, the medication is being converted to the equivalent oral dose form.  These criteria include: -No Active GI bleeding -Able to tolerate diet of full liquids (or better) or tube feeding OR able to tolerate other medications by the oral or enteral route  If you have any questions about this conversion, please contact the Pharmacy Department (ext [phone]).  Thank you.  [name], [facility] 08/05/2012 10:29 AM

## 2012-08-06 LAB — GLUCOSE, CAPILLARY
Glucose-Capillary: 125 mg/dL — ABNORMAL HIGH (ref 70–99)
Glucose-Capillary: 163 mg/dL — ABNORMAL HIGH (ref 70–99)
Glucose-Capillary: 186 mg/dL — ABNORMAL HIGH (ref 70–99)

## 2012-08-06 NOTE — Progress Notes (Signed)
  Subjective: No complaint of pain  Objective: Vital signs in last 24 hours: Temp:  [97.2 F (36.2 C)-99.7 F (37.6 C)] 97.2 F (36.2 C) (03/09 1500) Pulse Rate:  [76-101] 87 (03/09 1500) Resp:  [20] 20 (03/09 1500) BP: (104-137)/(67-80) 137/80 mmHg (03/09 1500) SpO2:  [96 %-98 %] 98 % (03/09 1500) Last BM Date: 08/05/12  Intake/Output from previous day: 03/08 0701 - 03/09 0700 In: 2668.8 [P.O.:480; I.V.:1788.8; IV Piggyback:400] Out: -  Intake/Output this shift: Total I/O In: 1038.8 [P.O.:240; I.V.:698.8; IV Piggyback:100] Out: -   General appearance: alert and no distress GI: soft, non-tender; bowel sounds normal; no masses,  no organomegaly and perineal fluctuants improved.  +purulent discharge.  +Erythema.  Lab Results:   Recent Labs  08/04/12 1308 08/05/12 0553  WBC 16.2* 13.0*  HGB 12.5* 11.1*  HCT 36.4* 32.9*  PLT 318 320   BMET  Recent Labs  08/04/12 1308 08/05/12 0553  NA 138 142  K 2.8* 2.9*  CL 95* 101  CO2 32 31  GLUCOSE 280* 118*  BUN 23 16  CREATININE 1.08 1.01  CALCIUM 9.3 8.7   PT/INR No results found for this basename: LABPROT, INR,  in the last 72 hours ABG No results found for this basename: PHART, PCO2, PO2, HCO3,  in the last 72 hours  Studies/Results: No results found.  Anti-infectives: Anti-infectives   Start     Dose/Rate Route Frequency Ordered Stop   08/05/12 0000  Ampicillin-Sulbactam (UNASYN) 3 g in sodium chloride 0.9 % 100 mL IVPB     3 g 100 mL/hr over 60 Minutes Intravenous Every 6 hours 08/04/12 2017     08/04/12 1930  Ampicillin-Sulbactam (UNASYN) 3 g in sodium chloride 0.9 % 100 mL IVPB  Status:  Discontinued     3 g 100 mL/hr over 60 Minutes Intravenous Every 6 hours 08/04/12 1916 08/04/12 2017   08/04/12 1745  Ampicillin-Sulbactam (UNASYN) 3 g in sodium chloride 0.9 % 100 mL IVPB     3 g 100 mL/hr over 60 Minutes Intravenous  Once 08/04/12 1734 08/04/12 1938      Assessment/Plan: s/p * No surgery found  * Perineal abscess.  Continue abx.  Continue warm compresses.    LOS: 2 days    ZIEGLER,BRENT C 08/06/2012

## 2012-08-06 NOTE — Progress Notes (Signed)
Went into reassess patients wound. Less swelling but continued redness noted since yesterday but two more open areas for a total of 3 draining areas. Able to express moderate amount of purlent drainage from larges area.

## 2012-08-07 LAB — CBC
MCH: 30.6 pg (ref 26.0–34.0)
MCHC: 32.8 g/dL (ref 30.0–36.0)
Platelets: 360 10*3/uL (ref 150–400)
RBC: 4.21 MIL/uL — ABNORMAL LOW (ref 4.22–5.81)

## 2012-08-07 LAB — GLUCOSE, CAPILLARY
Glucose-Capillary: 92 mg/dL (ref 70–99)
Glucose-Capillary: 215 mg/dL — ABNORMAL HIGH (ref 70–99)
Glucose-Capillary: 239 mg/dL — ABNORMAL HIGH (ref 70–99)

## 2012-08-07 NOTE — Progress Notes (Signed)
UR chart review completed.  

## 2012-08-07 NOTE — Care Management Note (Signed)
    Page 1 of 2   08/08/2012     3:56:51 PM   CARE MANAGEMENT NOTE 08/08/2012  Patient:  [name], [name]   Account Number:  [id number]  Date Initiated:  08/07/2012  Documentation initiated by:  Sharrie Rothman  Subjective/Objective Assessment:   [facility] with perirectal abcess. Pt will return at discharge. Pt has a sister who is very active in the care of the pt.     Action/Plan:   CM will arrange HH at discharge per sisters request.   Anticipated DC Date:  08/09/2012   Anticipated DC Plan:  ASSISTED LIVING / REST HOME  In-house referral  Clinical Social Worker      DC Associate Professor  CM consult      [facility] Facility Choice  HOME HEALTH   Choice offered to / List presented to:  C-5 Sibling        HH arranged  HH-1 RN      [facility] agency  [facility]   Status of service:  Completed, signed off Medicare Important Message given?  YES (If response is "NO", the following Medicare IM given date fields will be blank) Date Medicare IM given:  08/08/2012 Date Additional Medicare IM given:    Discharge Disposition:  HOME W HOME HEALTH SERVICES  Per UR Regulation:    If discussed at Long Length of Stay Meetings, dates discussed:    Comments:  08/08/12 1555 Arlyss Queen, RN BSN CM Pt discharged back to [facility] from Home with [facility] RN. Tula Nakayama of [facility] is aware of consult. CSW to arrange discharge to facility.  08/07/12 1455 Arlyss Queen, RN BSN CM

## 2012-08-07 NOTE — Clinical Social Work Psychosocial (Signed)
Clinical Social Work Department BRIEF PSYCHOSOCIAL ASSESSMENT 08/07/2012  Patient:  [name], [name]     Account Number:  [id number]     Admit date:  08/04/2012  Clinical Social Worker:  Nancie Neas  Date/Time:  08/07/2012 02:20 PM  Referred by:  Physician  Date Referred:  08/07/2012 Referred for  SNF Placement   Other Referral:   Interview type:  Family Other interview type:   [name]- sister    PSYCHOSOCIAL DATA Living Status:  FACILITY Admitted from facility:  OTHER Level of care:  Family Care Home Primary support name:  [name] Primary support relationship to patient:  SIBLING Degree of support available:   supportive (guardian)    CURRENT CONCERNS Current Concerns  Post-Acute Placement   Other Concerns:    SOCIAL WORK ASSESSMENT / PLAN CSW met with pt's sister [name] at bedside. Pt has MR. [name] states she is pt's guardian. He is a resident at [facility] where he shares a room with his mother. [name] visits daily and said she spends about 2 hours there with her mother and pt. [name] is concerned about pt's wound and the ability of current level of care to provide appropriate treatment. She reports some concerns as well regarding staff. [name] has addressed them with the administrator. CSW spoke with CM about situation who reports wound is not a skilled need and IV antibiotics will be stopped prior to d/c according to MD. [name] notified of above. CSW provided option of new ALF. She reports that anything long term, she would want her mother and pt to go together. She was given list and may consider this in the future but requests for pt to transfer back to [facility] at d/c from hospital. [name] would like for pt to have home health if qualifies. CM aware. Per Shuvon at facility, pt is extensive assist with personal care but can ambulate independently. They feel they can continue to manage pt's care and okay to return. Shuvon is aware of family's concerns.    Assessment/plan status:  Psychosocial Support/Ongoing Assessment of Needs Other assessment/ plan:   Information/referral to community resources:   ALF list    PATIENT'S/FAMILY'S RESPONSE TO PLAN OF CARE: Pt unable to discuss plan of care. Pt's sister requests for pt to return to [facility] when medically stable. CSW to continue to follow.        Derenda Fennel, [place] [phone]

## 2012-08-07 NOTE — Progress Notes (Signed)
  Subjective: No complaints of pain.  No nausea.    Objective: Vital signs in last 24 hours: Temp:  [97.3 F (36.3 C)-98.9 F (37.2 C)] 98.2 F (36.8 C) (03/10 2119) Pulse Rate:  [74-103] 74 (03/10 2119) Resp:  [16-18] 18 (03/10 2119) BP: (114-126)/(67-78) 114/67 mmHg (03/10 2119) SpO2:  [94 %-97 %] 97 % (03/10 2119) Last BM Date: 08/07/12  Intake/Output from previous day: 03/09 0701 - 03/10 0700 In: 2978.8 [P.O.:360; I.V.:2218.8; IV Piggyback:400] Out: -  Intake/Output this shift:    General appearance: alert and no distress Perineum:  Erythema improving.  +thick purulent discharge.    Lab Results:   Recent Labs  08/05/12 0553 08/07/12 1221  WBC 13.0* 10.0  HGB 11.1* 12.9*  HCT 32.9* 39.3  PLT 320 360   BMET  Recent Labs  08/05/12 0553  NA 142  K 2.9*  CL 101  CO2 31  GLUCOSE 118*  BUN 16  CREATININE 1.01  CALCIUM 8.7   PT/INR No results found for this basename: LABPROT, INR,  in the last 72 hours ABG No results found for this basename: PHART, PCO2, PO2, HCO3,  in the last 72 hours  Studies/Results: No results found.  Anti-infectives: Anti-infectives   Start     Dose/Rate Route Frequency Ordered Stop   08/05/12 0000  Ampicillin-Sulbactam (UNASYN) 3 g in sodium chloride 0.9 % 100 mL IVPB     3 g 100 mL/hr over 60 Minutes Intravenous Every 6 hours 08/04/12 2017     08/04/12 1930  Ampicillin-Sulbactam (UNASYN) 3 g in sodium chloride 0.9 % 100 mL IVPB  Status:  Discontinued     3 g 100 mL/hr over 60 Minutes Intravenous Every 6 hours 08/04/12 1916 08/04/12 2017   08/04/12 1745  Ampicillin-Sulbactam (UNASYN) 3 g in sodium chloride 0.9 % 100 mL IVPB     3 g 100 mL/hr over 60 Minutes Intravenous  Once 08/04/12 1734 08/04/12 1938      Assessment/Plan: s/p * No surgery found * Perineal abscess.  Continue IV abx for 24 hours.  Check cbc in am.  Continue warm compresses.  Hopeful discharge tomorrow.  LOS: 3 days    ZIEGLER,BRENT C 08/07/2012

## 2012-08-08 LAB — GLUCOSE, CAPILLARY: Glucose-Capillary: 144 mg/dL — ABNORMAL HIGH (ref 70–99)

## 2012-08-08 LAB — CBC
HCT: 39.3 % (ref 39.0–52.0)
Hemoglobin: 12.7 g/dL — ABNORMAL LOW (ref 13.0–17.0)
RBC: 4.24 MIL/uL (ref 4.22–5.81)
RDW: 13.4 % (ref 11.5–15.5)
WBC: 9.9 10*3/uL (ref 4.0–10.5)

## 2012-08-08 MED ORDER — POLYVINYL ALCOHOL 1.4 % OP SOLN
1.0000 [drp] | OPHTHALMIC | Status: DC | PRN
  Administered 2012-08-08: 1 [drp] via OPHTHALMIC
  Filled 2012-08-08: qty 15

## 2012-08-08 MED ORDER — AMOXICILLIN-POT CLAVULANATE 500-125 MG PO TABS: 1.0000 | ORAL_TABLET | Freq: Two times a day (BID) | ORAL | Status: DC

## 2012-08-08 NOTE — Progress Notes (Signed)
D/c instructions given to pts sister who is his caregiver. All questions answered. I reviewed care of wound, follow up appts and new prescription. IV d/c. Pt d/c via wheelchair accompanied by NT, to return to [facility]. [name], RN

## 2012-08-08 NOTE — Progress Notes (Signed)
Report called to [name] at [facility] from [facility], d/c instructions explained. No questions at this time. [name]

## 2012-08-08 NOTE — Clinical Social Work Note (Signed)
CSW updated FL2 using AVS and script left for pt. Notified MD that Augmentin was not on med list. RN aware to print packet when this is corrected. Pt to transfer with sister back to [facility]. Facility agreeable.   Derenda Fennel, [place] [phone]

## 2012-08-08 NOTE — Discharge Summary (Signed)
Physician Discharge Summary  Patient ID: [name] MRN: [id number] DOB/AGE: April 03, 1956 57 y.o.  Admit date: 08/04/2012 Discharge date: 08/08/2012  Admission Diagnoses:  Perineal abscess   Discharge Diagnoses: the same Active Problems:   * No active hospital problems. *   Discharged Condition: stable  Hospital Course: patient presented with perineal pain and swelling  Large perineal abscess identified  spontaneouly drained  Patient was continued on iv abx  Tolerated well with good clinical progression  Plans made for discharge  Consults: None  Significant Diagnostic Studies: labs: daily cbc  Treatments: antibiotics: Unasyn  Discharge Exam: Blood pressure 119/75, pulse 104, temperature 98.3 F (36.8 C), temperature source Oral, resp. rate 20, height 5\' 8"  (1.727 m), weight 69.3 kg (152 lb 12.5 oz), SpO2 97.00%. General appearance: cooperative, no distress and slow mentation Resp: clear to auscultation bilaterally Cardio: regular rate and rhythm GI: soft, non-tender; bowel sounds normal; no masses,  no organomegaly and perineum with minimal erythema  drainage decreased  Disposition: 01-Home or Self Care  Discharge Orders   Future Orders Complete By Expires     Diet - low sodium heart healthy  As directed     Discharge instructions  As directed     Comments:      Warm compresses to perineum at least three times daily  And anticipate discharge    Increase activity slowly  As directed         Medication List    TAKE these medications       Cholecalciferol 1000 UNITS capsule  Take 1,000 Units by mouth daily.     fexofenadine 180 MG tablet  Commonly known as:  ALLEGRA  Take 180 mg by mouth daily.     furosemide 20 MG tablet  Commonly known as:  LASIX  Take 20 mg by mouth 3 (three) times daily.     insulin aspart 100 UNIT/ML injection  Commonly known as:  novoLOG  Inject 0-20 Units into the skin 3 (three) times daily with meals.     insulin glargine 100 UNIT/ML  injection  Commonly known as:  LANTUS  Inject 28 Units into the skin at bedtime.     ketoconazole 2 % shampoo  Commonly known as:  NIZORAL  Apply 1 application topically 2 (two) times a week.     losartan-hydrochlorothiazide 100-25 MG per tablet  Commonly known as:  HYZAAR  Take 1 tablet by mouth daily.     potassium chloride SA 20 MEQ tablet  Commonly known as:  K-DUR,KLOR-CON  Take 20 mEq by mouth daily.           Follow-up Information   Follow up with [facility].   Contact information:   [address] [place] [address] [phone]      Signed: Fabio Bering 08/08/2012, 4:27 PM

## 2012-08-22 DIAGNOSIS — F79 Unspecified intellectual disabilities: Secondary | ICD-10-CM | POA: Diagnosis not present

## 2012-08-22 DIAGNOSIS — Z794 Long term (current) use of insulin: Secondary | ICD-10-CM | POA: Diagnosis not present

## 2012-08-22 DIAGNOSIS — E119 Type 2 diabetes mellitus without complications: Secondary | ICD-10-CM | POA: Diagnosis not present

## 2012-08-22 DIAGNOSIS — I10 Essential (primary) hypertension: Secondary | ICD-10-CM | POA: Diagnosis not present

## 2012-08-22 DIAGNOSIS — L03319 Cellulitis of trunk, unspecified: Secondary | ICD-10-CM | POA: Diagnosis not present

## 2012-08-25 DIAGNOSIS — L02219 Cutaneous abscess of trunk, unspecified: Secondary | ICD-10-CM | POA: Diagnosis not present

## 2012-08-25 DIAGNOSIS — E119 Type 2 diabetes mellitus without complications: Secondary | ICD-10-CM | POA: Diagnosis not present

## 2012-08-25 DIAGNOSIS — Z794 Long term (current) use of insulin: Secondary | ICD-10-CM | POA: Diagnosis not present

## 2012-08-25 DIAGNOSIS — F79 Unspecified intellectual disabilities: Secondary | ICD-10-CM | POA: Diagnosis not present

## 2012-08-25 DIAGNOSIS — I10 Essential (primary) hypertension: Secondary | ICD-10-CM | POA: Diagnosis not present

## 2012-08-28 DIAGNOSIS — I10 Essential (primary) hypertension: Secondary | ICD-10-CM | POA: Diagnosis not present

## 2012-08-28 DIAGNOSIS — L02219 Cutaneous abscess of trunk, unspecified: Secondary | ICD-10-CM | POA: Diagnosis not present

## 2012-08-28 DIAGNOSIS — F79 Unspecified intellectual disabilities: Secondary | ICD-10-CM | POA: Diagnosis not present

## 2012-08-28 DIAGNOSIS — Z794 Long term (current) use of insulin: Secondary | ICD-10-CM | POA: Diagnosis not present

## 2012-08-28 DIAGNOSIS — E119 Type 2 diabetes mellitus without complications: Secondary | ICD-10-CM | POA: Diagnosis not present

## 2012-08-31 DIAGNOSIS — Z794 Long term (current) use of insulin: Secondary | ICD-10-CM | POA: Diagnosis not present

## 2012-08-31 DIAGNOSIS — E119 Type 2 diabetes mellitus without complications: Secondary | ICD-10-CM | POA: Diagnosis not present

## 2012-08-31 DIAGNOSIS — L02219 Cutaneous abscess of trunk, unspecified: Secondary | ICD-10-CM | POA: Diagnosis not present

## 2012-08-31 DIAGNOSIS — F79 Unspecified intellectual disabilities: Secondary | ICD-10-CM | POA: Diagnosis not present

## 2012-08-31 DIAGNOSIS — I10 Essential (primary) hypertension: Secondary | ICD-10-CM | POA: Diagnosis not present

## 2012-09-05 ENCOUNTER — Other Ambulatory Visit: Payer: Self-pay | Admitting: Urology

## 2012-09-05 ENCOUNTER — Ambulatory Visit (INDEPENDENT_AMBULATORY_CARE_PROVIDER_SITE_OTHER): Payer: Medicare Other | Admitting: Urology

## 2012-09-05 ENCOUNTER — Ambulatory Visit (HOSPITAL_COMMUNITY)
Admission: RE | Admit: 2012-09-05 | Discharge: 2012-09-05 | Disposition: A | Discharge status: Home / Self Care | Payer: Medicare Other | Source: Ambulatory Visit | Attending: Urology | Admitting: Urology

## 2012-09-05 DIAGNOSIS — N323 Diverticulum of bladder: Secondary | ICD-10-CM

## 2012-09-05 DIAGNOSIS — N200 Calculus of kidney: Secondary | ICD-10-CM

## 2012-09-05 DIAGNOSIS — R109 Unspecified abdominal pain: Secondary | ICD-10-CM | POA: Insufficient documentation

## 2012-09-05 DIAGNOSIS — N320 Bladder-neck obstruction: Secondary | ICD-10-CM | POA: Diagnosis not present

## 2012-09-06 DIAGNOSIS — E119 Type 2 diabetes mellitus without complications: Secondary | ICD-10-CM | POA: Diagnosis not present

## 2012-09-06 DIAGNOSIS — F79 Unspecified intellectual disabilities: Secondary | ICD-10-CM | POA: Diagnosis not present

## 2012-09-06 DIAGNOSIS — L03319 Cellulitis of trunk, unspecified: Secondary | ICD-10-CM | POA: Diagnosis not present

## 2012-09-06 DIAGNOSIS — Z794 Long term (current) use of insulin: Secondary | ICD-10-CM | POA: Diagnosis not present

## 2012-09-06 DIAGNOSIS — I10 Essential (primary) hypertension: Secondary | ICD-10-CM | POA: Diagnosis not present

## 2012-09-19 DIAGNOSIS — E119 Type 2 diabetes mellitus without complications: Secondary | ICD-10-CM | POA: Diagnosis not present

## 2012-09-19 DIAGNOSIS — L03319 Cellulitis of trunk, unspecified: Secondary | ICD-10-CM | POA: Diagnosis not present

## 2012-09-19 DIAGNOSIS — Z794 Long term (current) use of insulin: Secondary | ICD-10-CM | POA: Diagnosis not present

## 2012-09-19 DIAGNOSIS — I10 Essential (primary) hypertension: Secondary | ICD-10-CM | POA: Diagnosis not present

## 2012-09-19 DIAGNOSIS — F79 Unspecified intellectual disabilities: Secondary | ICD-10-CM | POA: Diagnosis not present

## 2012-10-03 DIAGNOSIS — Z79899 Other long term (current) drug therapy: Secondary | ICD-10-CM | POA: Diagnosis not present

## 2012-10-03 DIAGNOSIS — N318 Other neuromuscular dysfunction of bladder: Secondary | ICD-10-CM | POA: Diagnosis not present

## 2012-10-03 DIAGNOSIS — Z23 Encounter for immunization: Secondary | ICD-10-CM | POA: Diagnosis not present

## 2012-10-03 DIAGNOSIS — E119 Type 2 diabetes mellitus without complications: Secondary | ICD-10-CM | POA: Diagnosis not present

## 2012-10-03 DIAGNOSIS — I10 Essential (primary) hypertension: Secondary | ICD-10-CM | POA: Diagnosis not present

## 2012-10-03 DIAGNOSIS — Z6827 Body mass index (BMI) 27.0-27.9, adult: Secondary | ICD-10-CM | POA: Diagnosis not present

## 2012-10-03 DIAGNOSIS — M159 Polyosteoarthritis, unspecified: Secondary | ICD-10-CM | POA: Diagnosis not present

## 2012-10-03 DIAGNOSIS — Z125 Encounter for screening for malignant neoplasm of prostate: Secondary | ICD-10-CM | POA: Diagnosis not present

## 2012-10-05 DIAGNOSIS — E119 Type 2 diabetes mellitus without complications: Secondary | ICD-10-CM | POA: Diagnosis not present

## 2012-10-05 DIAGNOSIS — E1149 Type 2 diabetes mellitus with other diabetic neurological complication: Secondary | ICD-10-CM | POA: Diagnosis not present

## 2012-10-30 DIAGNOSIS — Z6826 Body mass index (BMI) 26.0-26.9, adult: Secondary | ICD-10-CM | POA: Diagnosis not present

## 2012-10-30 DIAGNOSIS — E119 Type 2 diabetes mellitus without complications: Secondary | ICD-10-CM | POA: Diagnosis not present

## 2012-10-30 DIAGNOSIS — I10 Essential (primary) hypertension: Secondary | ICD-10-CM | POA: Diagnosis not present

## 2012-11-10 DIAGNOSIS — K029 Dental caries, unspecified: Secondary | ICD-10-CM | POA: Diagnosis not present

## 2012-11-10 DIAGNOSIS — F430 Acute stress reaction: Secondary | ICD-10-CM | POA: Diagnosis not present

## 2012-11-21 DIAGNOSIS — E119 Type 2 diabetes mellitus without complications: Secondary | ICD-10-CM | POA: Diagnosis not present

## 2012-11-21 DIAGNOSIS — Z23 Encounter for immunization: Secondary | ICD-10-CM | POA: Diagnosis not present

## 2012-12-21 DIAGNOSIS — E119 Type 2 diabetes mellitus without complications: Secondary | ICD-10-CM | POA: Diagnosis not present

## 2012-12-21 DIAGNOSIS — E1149 Type 2 diabetes mellitus with other diabetic neurological complication: Secondary | ICD-10-CM | POA: Diagnosis not present

## 2013-03-01 DIAGNOSIS — E119 Type 2 diabetes mellitus without complications: Secondary | ICD-10-CM | POA: Diagnosis not present

## 2013-03-01 DIAGNOSIS — E1149 Type 2 diabetes mellitus with other diabetic neurological complication: Secondary | ICD-10-CM | POA: Diagnosis not present

## 2013-03-01 DIAGNOSIS — L609 Nail disorder, unspecified: Secondary | ICD-10-CM | POA: Diagnosis not present

## 2013-03-01 DIAGNOSIS — L851 Acquired keratosis [keratoderma] palmaris et plantaris: Secondary | ICD-10-CM | POA: Diagnosis not present

## 2013-03-13 ENCOUNTER — Ambulatory Visit (INDEPENDENT_AMBULATORY_CARE_PROVIDER_SITE_OTHER): Payer: Medicare Other | Admitting: Urology

## 2013-03-13 ENCOUNTER — Encounter (INDEPENDENT_AMBULATORY_CARE_PROVIDER_SITE_OTHER): Payer: Self-pay

## 2013-03-13 DIAGNOSIS — N200 Calculus of kidney: Secondary | ICD-10-CM

## 2013-03-13 DIAGNOSIS — N3020 Other chronic cystitis without hematuria: Secondary | ICD-10-CM | POA: Diagnosis not present

## 2013-03-13 DIAGNOSIS — N323 Diverticulum of bladder: Secondary | ICD-10-CM | POA: Diagnosis not present

## 2013-03-13 DIAGNOSIS — N320 Bladder-neck obstruction: Secondary | ICD-10-CM

## 2013-03-19 DIAGNOSIS — Z23 Encounter for immunization: Secondary | ICD-10-CM | POA: Diagnosis not present

## 2013-04-04 DIAGNOSIS — Z23 Encounter for immunization: Secondary | ICD-10-CM | POA: Diagnosis not present

## 2013-04-04 DIAGNOSIS — E119 Type 2 diabetes mellitus without complications: Secondary | ICD-10-CM | POA: Diagnosis not present

## 2013-05-10 DIAGNOSIS — E1149 Type 2 diabetes mellitus with other diabetic neurological complication: Secondary | ICD-10-CM | POA: Diagnosis not present

## 2013-05-10 DIAGNOSIS — L851 Acquired keratosis [keratoderma] palmaris et plantaris: Secondary | ICD-10-CM | POA: Diagnosis not present

## 2013-05-10 DIAGNOSIS — E119 Type 2 diabetes mellitus without complications: Secondary | ICD-10-CM | POA: Diagnosis not present

## 2013-05-10 DIAGNOSIS — L609 Nail disorder, unspecified: Secondary | ICD-10-CM | POA: Diagnosis not present

## 2013-05-17 DIAGNOSIS — J984 Other disorders of lung: Secondary | ICD-10-CM | POA: Diagnosis not present

## 2013-05-17 DIAGNOSIS — J310 Chronic rhinitis: Secondary | ICD-10-CM | POA: Diagnosis not present

## 2013-05-17 DIAGNOSIS — Z6827 Body mass index (BMI) 27.0-27.9, adult: Secondary | ICD-10-CM | POA: Diagnosis not present

## 2013-08-09 DIAGNOSIS — E1149 Type 2 diabetes mellitus with other diabetic neurological complication: Secondary | ICD-10-CM | POA: Diagnosis not present

## 2013-10-18 DIAGNOSIS — E1149 Type 2 diabetes mellitus with other diabetic neurological complication: Secondary | ICD-10-CM | POA: Diagnosis not present

## 2014-01-01 DIAGNOSIS — L851 Acquired keratosis [keratoderma] palmaris et plantaris: Secondary | ICD-10-CM | POA: Diagnosis not present

## 2014-01-01 DIAGNOSIS — E1149 Type 2 diabetes mellitus with other diabetic neurological complication: Secondary | ICD-10-CM | POA: Diagnosis not present

## 2014-01-01 DIAGNOSIS — L609 Nail disorder, unspecified: Secondary | ICD-10-CM | POA: Diagnosis not present

## 2014-03-04 ENCOUNTER — Observation Stay (HOSPITAL_COMMUNITY)
Admission: EM | Admit: 2014-03-04 | Discharge: 2014-03-06 | Disposition: A | Discharge status: Home / Self Care | Payer: Medicare Other | Attending: Internal Medicine | Admitting: Internal Medicine

## 2014-03-04 ENCOUNTER — Encounter (HOSPITAL_COMMUNITY): Payer: Self-pay | Admitting: Emergency Medicine

## 2014-03-04 DIAGNOSIS — F79 Unspecified intellectual disabilities: Secondary | ICD-10-CM | POA: Diagnosis not present

## 2014-03-04 DIAGNOSIS — N289 Disorder of kidney and ureter, unspecified: Secondary | ICD-10-CM | POA: Insufficient documentation

## 2014-03-04 DIAGNOSIS — N19 Unspecified kidney failure: Secondary | ICD-10-CM

## 2014-03-04 DIAGNOSIS — E876 Hypokalemia: Secondary | ICD-10-CM | POA: Diagnosis present

## 2014-03-04 DIAGNOSIS — R739 Hyperglycemia, unspecified: Secondary | ICD-10-CM | POA: Diagnosis not present

## 2014-03-04 DIAGNOSIS — E1310 Other specified diabetes mellitus with ketoacidosis without coma: Secondary | ICD-10-CM

## 2014-03-04 DIAGNOSIS — N390 Urinary tract infection, site not specified: Secondary | ICD-10-CM

## 2014-03-04 DIAGNOSIS — E871 Hypo-osmolality and hyponatremia: Secondary | ICD-10-CM | POA: Diagnosis not present

## 2014-03-04 DIAGNOSIS — I10 Essential (primary) hypertension: Secondary | ICD-10-CM

## 2014-03-04 DIAGNOSIS — R7309 Other abnormal glucose: Secondary | ICD-10-CM | POA: Diagnosis not present

## 2014-03-04 DIAGNOSIS — Z794 Long term (current) use of insulin: Secondary | ICD-10-CM | POA: Diagnosis not present

## 2014-03-04 DIAGNOSIS — Z8619 Personal history of other infectious and parasitic diseases: Secondary | ICD-10-CM | POA: Diagnosis not present

## 2014-03-04 DIAGNOSIS — E870 Hyperosmolality and hypernatremia: Secondary | ICD-10-CM

## 2014-03-04 DIAGNOSIS — Z79899 Other long term (current) drug therapy: Secondary | ICD-10-CM | POA: Diagnosis not present

## 2014-03-04 DIAGNOSIS — E872 Acidosis: Secondary | ICD-10-CM | POA: Insufficient documentation

## 2014-03-04 DIAGNOSIS — Z23 Encounter for immunization: Secondary | ICD-10-CM | POA: Diagnosis not present

## 2014-03-04 DIAGNOSIS — E1169 Type 2 diabetes mellitus with other specified complication: Secondary | ICD-10-CM | POA: Insufficient documentation

## 2014-03-04 DIAGNOSIS — E875 Hyperkalemia: Secondary | ICD-10-CM

## 2014-03-04 DIAGNOSIS — E1165 Type 2 diabetes mellitus with hyperglycemia: Principal | ICD-10-CM | POA: Insufficient documentation

## 2014-03-04 DIAGNOSIS — E878 Other disorders of electrolyte and fluid balance, not elsewhere classified: Secondary | ICD-10-CM

## 2014-03-04 DIAGNOSIS — R7881 Bacteremia: Secondary | ICD-10-CM

## 2014-03-04 DIAGNOSIS — E1110 Type 2 diabetes mellitus with ketoacidosis without coma: Secondary | ICD-10-CM

## 2014-03-04 DIAGNOSIS — R6250 Unspecified lack of expected normal physiological development in childhood: Secondary | ICD-10-CM

## 2014-03-04 LAB — URINALYSIS, ROUTINE W REFLEX MICROSCOPIC
BILIRUBIN URINE: NEGATIVE
GLUCOSE, UA: 500 mg/dL — AB
HGB URINE DIPSTICK: NEGATIVE
Ketones, ur: NEGATIVE mg/dL
Leukocytes, UA: NEGATIVE
Nitrite: NEGATIVE
PH: 7 (ref 5.0–8.0)
Protein, ur: NEGATIVE mg/dL
UROBILINOGEN UA: 0.2 mg/dL (ref 0.0–1.0)

## 2014-03-04 LAB — GLUCOSE, CAPILLARY: Glucose-Capillary: 275 mg/dL — ABNORMAL HIGH (ref 70–99)

## 2014-03-04 LAB — CBC WITH DIFFERENTIAL/PLATELET
BASOS ABS: 0.1 10*3/uL (ref 0.0–0.1)
Basophils Relative: 1 % (ref 0–1)
EOS ABS: 0.1 10*3/uL (ref 0.0–0.7)
EOS PCT: 1 % (ref 0–5)
HCT: 36.9 % — ABNORMAL LOW (ref 39.0–52.0)
Hemoglobin: 12.7 g/dL — ABNORMAL LOW (ref 13.0–17.0)
LYMPHS ABS: 1.2 10*3/uL (ref 0.7–4.0)
LYMPHS PCT: 12 % (ref 12–46)
MCH: 30.8 pg (ref 26.0–34.0)
MCHC: 34.4 g/dL (ref 30.0–36.0)
MCV: 89.3 fL (ref 78.0–100.0)
Monocytes Absolute: 0.7 10*3/uL (ref 0.1–1.0)
Monocytes Relative: 7 % (ref 3–12)
NEUTROS PCT: 79 % — AB (ref 43–77)
Neutro Abs: 7.8 10*3/uL — ABNORMAL HIGH (ref 1.7–7.7)
PLATELETS: 264 10*3/uL (ref 150–400)
RBC: 4.13 MIL/uL — AB (ref 4.22–5.81)
RDW: 12.2 % (ref 11.5–15.5)
WBC: 9.8 10*3/uL (ref 4.0–10.5)

## 2014-03-04 LAB — COMPREHENSIVE METABOLIC PANEL
ALBUMIN: 3.9 g/dL (ref 3.5–5.2)
ALK PHOS: 92 U/L (ref 39–117)
ALT: 12 U/L (ref 0–53)
AST: 22 U/L (ref 0–37)
Anion gap: 19 — ABNORMAL HIGH (ref 5–15)
BUN: 31 mg/dL — ABNORMAL HIGH (ref 6–23)
CALCIUM: 9 mg/dL (ref 8.4–10.5)
CO2: 28 mEq/L (ref 19–32)
Chloride: 75 mEq/L — ABNORMAL LOW (ref 96–112)
Creatinine, Ser: 1.18 mg/dL (ref 0.50–1.35)
GFR calc Af Amer: 77 mL/min — ABNORMAL LOW (ref >90)
GFR calc non Af Amer: 67 mL/min — ABNORMAL LOW (ref >90)
GLUCOSE: 372 mg/dL — AB (ref 70–99)
POTASSIUM: 2.4 meq/L — AB (ref 3.7–5.3)
SODIUM: 122 meq/L — AB (ref 137–147)
TOTAL PROTEIN: 7.3 g/dL (ref 6.0–8.3)
Total Bilirubin: 0.3 mg/dL (ref 0.3–1.2)

## 2014-03-04 LAB — MAGNESIUM: MAGNESIUM: 2.2 mg/dL (ref 1.5–2.5)

## 2014-03-04 LAB — CBG MONITORING, ED
GLUCOSE-CAPILLARY: 290 mg/dL — AB (ref 70–99)
Glucose-Capillary: 412 mg/dL — ABNORMAL HIGH (ref 70–99)

## 2014-03-04 MED ORDER — ACETAMINOPHEN 650 MG RE SUPP: 650.0000 mg | Freq: Four times a day (QID) | RECTAL | Status: DC | PRN

## 2014-03-04 MED ORDER — POTASSIUM CHLORIDE CRYS ER 20 MEQ PO TBCR
40.0000 meq | EXTENDED_RELEASE_TABLET | Freq: Once | ORAL | Status: AC
  Administered 2014-03-04: 40 meq via ORAL
  Filled 2014-03-04: qty 2

## 2014-03-04 MED ORDER — ACETAMINOPHEN 325 MG PO TABS: 650.0000 mg | ORAL_TABLET | Freq: Four times a day (QID) | ORAL | Status: DC | PRN

## 2014-03-04 MED ORDER — KCL-LACTATED RINGERS-D5W 20 MEQ/L IV SOLN
INTRAVENOUS | Status: DC
  Administered 2014-03-04 – 2014-03-05 (×2): via INTRAVENOUS
  Filled 2014-03-04 (×3): qty 1000

## 2014-03-04 MED ORDER — KCL-LACTATED RINGERS-D5W 20 MEQ/L IV SOLN
INTRAVENOUS | Status: AC
  Filled 2014-03-04: qty 2000

## 2014-03-04 MED ORDER — SODIUM CHLORIDE 0.9 % IV SOLN
INTRAVENOUS | Status: DC | PRN
  Filled 2014-03-04: qty 2.5

## 2014-03-04 MED ORDER — LOSARTAN POTASSIUM 50 MG PO TABS
100.0000 mg | ORAL_TABLET | Freq: Every day | ORAL | Status: DC
  Administered 2014-03-05 – 2014-03-06 (×2): 100 mg via ORAL
  Filled 2014-03-04 (×2): qty 2

## 2014-03-04 MED ORDER — INSULIN REGULAR BOLUS VIA INFUSION
0.0000 [IU] | Freq: Three times a day (TID) | INTRAVENOUS | Status: DC | PRN
  Filled 2014-03-04: qty 10

## 2014-03-04 MED ORDER — ONDANSETRON HCL 4 MG/2ML IJ SOLN: 4.0000 mg | Freq: Four times a day (QID) | INTRAMUSCULAR | Status: DC | PRN

## 2014-03-04 MED ORDER — INSULIN ASPART 100 UNIT/ML ~~LOC~~ SOLN
0.0000 [IU] | SUBCUTANEOUS | Status: DC
  Administered 2014-03-04 – 2014-03-05 (×2): 12 [IU] via SUBCUTANEOUS
  Administered 2014-03-05: 8 [IU] via SUBCUTANEOUS
  Administered 2014-03-05: 12 [IU] via SUBCUTANEOUS

## 2014-03-04 MED ORDER — FUROSEMIDE 20 MG PO TABS
20.0000 mg | ORAL_TABLET | Freq: Three times a day (TID) | ORAL | Status: DC
  Administered 2014-03-04 – 2014-03-06 (×5): 20 mg via ORAL
  Filled 2014-03-04 (×5): qty 1

## 2014-03-04 MED ORDER — SODIUM CHLORIDE 0.9 % IV BOLUS (SEPSIS)
1000.0000 mL | Freq: Once | INTRAVENOUS | Status: AC
  Administered 2014-03-04: 1000 mL via INTRAVENOUS

## 2014-03-04 MED ORDER — POTASSIUM CHLORIDE 10 MEQ/100ML IV SOLN
10.0000 meq | INTRAVENOUS | Status: AC
  Administered 2014-03-04 (×2): 10 meq via INTRAVENOUS
  Filled 2014-03-04 (×2): qty 100

## 2014-03-04 MED ORDER — HEPARIN SODIUM (PORCINE) 5000 UNIT/ML IJ SOLN
5000.0000 [IU] | Freq: Three times a day (TID) | INTRAMUSCULAR | Status: DC
  Administered 2014-03-04 – 2014-03-06 (×5): 5000 [IU] via SUBCUTANEOUS
  Filled 2014-03-04 (×5): qty 1

## 2014-03-04 MED ORDER — INFLUENZA VAC SPLIT QUAD 0.5 ML IM SUSY
0.5000 mL | PREFILLED_SYRINGE | INTRAMUSCULAR | Status: AC
  Administered 2014-03-05: 0.5 mL via INTRAMUSCULAR
  Filled 2014-03-04: qty 0.5

## 2014-03-04 MED ORDER — INSULIN DETEMIR 100 UNIT/ML ~~LOC~~ SOLN
20.0000 [IU] | Freq: Every day | SUBCUTANEOUS | Status: DC
  Administered 2014-03-04 – 2014-03-05 (×2): 20 [IU] via SUBCUTANEOUS
  Filled 2014-03-04 (×2): qty 0.2

## 2014-03-04 MED ORDER — SODIUM CHLORIDE 0.9 % IJ SOLN
3.0000 mL | Freq: Two times a day (BID) | INTRAMUSCULAR | Status: DC
  Administered 2014-03-04 – 2014-03-05 (×3): 3 mL via INTRAVENOUS

## 2014-03-04 MED ORDER — INSULIN ASPART 100 UNIT/ML ~~LOC~~ SOLN
4.0000 [IU] | Freq: Once | SUBCUTANEOUS | Status: AC
  Administered 2014-03-04: 4 [IU] via INTRAVENOUS
  Filled 2014-03-04: qty 1

## 2014-03-04 MED ORDER — ONDANSETRON HCL 4 MG PO TABS: 4.0000 mg | ORAL_TABLET | Freq: Four times a day (QID) | ORAL | Status: DC | PRN

## 2014-03-04 MED ORDER — HYDROCHLOROTHIAZIDE 25 MG PO TABS
25.0000 mg | ORAL_TABLET | Freq: Every day | ORAL | Status: DC
  Administered 2014-03-05 – 2014-03-06 (×2): 25 mg via ORAL
  Filled 2014-03-04 (×2): qty 1

## 2014-03-04 MED ORDER — LOSARTAN POTASSIUM-HCTZ 100-25 MG PO TABS: 1.0000 | ORAL_TABLET | Freq: Every day | ORAL | Status: DC

## 2014-03-04 NOTE — ED Notes (Signed)
[facility]

## 2014-03-04 NOTE — Progress Notes (Signed)
Family requested foley due to family getting so many fluids and lasix. Three attempts made by NT, myself and charge nurse without success with a 14 F foley kit. MD will need to attempt foley if family continues to  insist on foley. Will continue to monitor and frequently round.

## 2014-03-04 NOTE — ED Notes (Signed)
Attempted to call report, Advised unable to take report by floor nurse

## 2014-03-04 NOTE — H&P (Signed)
[facility] History and Physical  JOEANGEL PROROK ZOX:[id number] DOB: 1955/09/13 DOA: 03/04/2014  Referring physician: Dr. Lynelle Doctor PCP: Cassell Smiles., MD  Specialists: none  Chief Complaint: hyperglycemia  Assessment/Plan Active Problems: DKA Hyperglycemia Developmental Delay Hypokalemia Hyponatremia Hypertension   DKA: Gluc 412, Na126, Cl 75, CO2 28, Gap 19 on admission. Unsure of pt getting proper administration of insulin at care facility.  - Admit to tele - Glucose Stabilizer - D5 LR w/ 20KCL at 179ml/hr (change to 1/2NS once Na improves - as below) - Bmet Q6 (x4 occurences) - Transition to Lantus and SSI once gap closes.   Hyponatremia: corrected Na on admission 126. Typically nml. Likely secondary to hyperosmoler state from DKA and dehydration while on Lasix. Sister reports polyuria. Pt given 3L NS in Ed - DKA as above.  - BMET Q6 - D5 LR w/ 20KCL at 122ml/hr (change to 1/2NS once Na improves)  Hypokalemia: K 2.4 on admission. Pt not taking Kdur at home. EKG w/ possible T-wave flattening. K given in ED. Anticipate further drop as gap closes. Mag nml - Tele - KCL in fluids as above - Kdur x 1 - Continue oral repletion as needed  Developmental Delay: at baseline per sister who is POA. - monitor  Hypertension:  - continue home hyzaar - hold lasix until the am.   DVT Prophylaxis: Hep Corn TID     Code Status: FULL Family Communication: Sister present for admission Disposition Plan: pending improvement  HPI: Ramzy R Allee is a 58 y.o. male came [facility] ed 03/04/2014 with  Hyperglycemia. Pt stays at care facility due to MR. CBG near 500. Called PCP and told to bring to Ed. Pt w/ any recollection of any symptoms other than frequent urination.   Discussed care w/ sister, Mrs. Riki Sheer, who is his POA.   Review of Systems: Per HPI w/ all other systems negative.   Past Medical History  Diagnosis Date  . MR (mental retardation)   . Hypertension    . UTI (urinary tract infection)   . Diabetes mellitus   . DKA (diabetic ketoacidoses)   . Gram-positive bacteremia   . Hypernatremia   . Sepsis(995.91)   . Renal disorder   . Hypokalemia    History reviewed. No pertinent past surgical history. Social History:  History   Social History Narrative  . No narrative on file    No Known Allergies  No family history on file.    Prior to Admission medications   Medication Sig Start Date End Date Taking? Authorizing Provider  Cholecalciferol 1000 UNITS capsule Take 1,000 Units by mouth daily.   Yes Historical Provider, MD  fexofenadine (ALLEGRA) 180 MG tablet Take 180 mg by mouth daily.     Yes Historical Provider, MD  furosemide (LASIX) 20 MG tablet Take 20 mg by mouth 3 (three) times daily.   Yes Historical Provider, MD  insulin aspart (NOVOLOG) 100 UNIT/ML injection Inject 0-20 Units into the skin 3 (three) times daily with meals. 06/07/11  Yes Kathlen Mody, MD  insulin glargine (LANTUS) 100 UNIT/ML injection Inject 35 Units into the skin at bedtime.  06/07/11  Yes Kathlen Mody, MD  ketoconazole (NIZORAL) 2 % shampoo Apply 1 application topically 2 (two) times a week.   Yes Historical Provider, MD  losartan-hydrochlorothiazide (HYZAAR) 100-25 MG per tablet Take 1 tablet by mouth daily.   Yes Historical Provider, MD  potassium chloride SA (K-DUR,KLOR-CON) 20 MEQ tablet Take 20 mEq by mouth daily.  Yes Historical Provider, MD   Physical Exam: Filed Vitals:   03/04/14 1324 03/04/14 1330 03/04/14 1512 03/04/14 1630  BP: 137/66 99/67 113/90 109/79  Pulse: 115  103   Temp: 97.9 F (36.6 C)     TempSrc: Axillary     Resp: 14  14 27   SpO2: 100% 97% 96%      General:  NAD  Eyes: EOMI   ENT: dry mucus membranes,  Neck: FROM  Cardiovascular: RRR, no m/r/g  Respiratory: Nml WOB. No wheezes, ronchi. Good breath sounds throughout  Abdomen: NABS, nonttp  Skin: warm, well perfused, intact  Musculoskeletal: No LE edema, moves all  extremities spontaneously   Psychiatric: answers questions appropriately 25% of the time.   Neurologic: CN2-12 Grossly intact, cerebellar fxn nml  Labs on Admission:  Basic Metabolic Panel:  Recent Labs Lab 03/04/14 1430  NA 122*  K 2.4*  CL 75*  CO2 28  GLUCOSE 372*  BUN 31*  CREATININE 1.18  CALCIUM 9.0  MG 2.2   Liver Function Tests:  Recent Labs Lab 03/04/14 1430  AST 22  ALT 12  ALKPHOS 92  BILITOT 0.3  PROT 7.3  ALBUMIN 3.9   No results found for this basename: LIPASE, AMYLASE,  in the last 168 hours No results found for this basename: AMMONIA,  in the last 168 hours CBC:  Recent Labs Lab 03/04/14 1430  WBC 9.8  NEUTROABS 7.8*  HGB 12.7*  HCT 36.9*  MCV 89.3  PLT 264   Cardiac Enzymes: No results found for this basename: CKTOTAL, CKMB, CKMBINDEX, TROPONINI,  in the last 168 hours  BNP (last 3 results) No results found for this basename: PROBNP,  in the last 8760 hours CBG:  Recent Labs Lab 03/04/14 1325 03/04/14 1632  GLUCAP 412* 290*    Radiological Exams on Admission: No results found.     Time spent: >70 min in direct pt care and coordination.    Lailany Enoch, Elmon Else, MD [facility] [email or web address] Password [email or web address] 03/04/2014, 5:29 PM

## 2014-03-04 NOTE — ED Notes (Signed)
Patient with hyperglycemia today. Given insulin, but CBG remains elevated last CBG by EMS 511. Patient MR, but alert/talkative. Normal mental status for patient.

## 2014-03-04 NOTE — ED Notes (Signed)
Admitting MD at bedside.

## 2014-03-04 NOTE — ED Provider Notes (Signed)
CSN: [id number]     Arrival date & time 03/04/14  1324 History  This chart was scribed for Ward Givens, MD by Annye Asa, ED Scribe. This patient was seen in room APA19/APA19 and the patient's care was started at 2:27 PM.    Chief Complaint  Patient presents with  . Hyperglycemia   Level V caveat for mental retardation  The history is provided by the patient and a relative. No language interpreter was used.    HPI Comments: [name] is a 58 y.o. male who presents to the Emergency Department via EMS complaining of hyperglycemia. Per EMS, he received his normal Lantus and Novolog this morning, and his CBG went up from the 400s - 511. Nurse notes associated frequent urination. Patient denies nausea or vomiting. His current coughing is normal/baseline and happens when he is nervous per his siter. Patient's sister reports that he was only recently diagnosed with DM (2-3 years prior), and she explains that he has history of kidney stones.   Patient is currently in assisted living, alongside his mother who has dementia  PCP Dr Sherwood Gambler  Past Medical History  Diagnosis Date  . MR (mental retardation)   . Hypertension   . UTI (urinary tract infection)   . Diabetes mellitus   . DKA (diabetic ketoacidoses)   . Gram-positive bacteremia   . Hypernatremia   . Sepsis(995.91)   . Renal disorder   . Hypokalemia    History reviewed. No pertinent past surgical history. No family history on file. History  Substance Use Topics  . Smoking status: Never Smoker   . Smokeless tobacco: Never Used  . Alcohol Use: No  lives in ALF  Review of Systems  Unable to perform ROS   A complete 10 system review of systems was obtained and all systems are negative except as noted in the HPI and PMH.    Allergies  Review of patient's allergies indicates no known allergies.  Home Medications   Prior to Admission medications   Medication Sig Start Date End Date Taking? Authorizing Provider   Cholecalciferol 1000 UNITS capsule Take 1,000 Units by mouth daily.   Yes Historical Provider, MD  fexofenadine (ALLEGRA) 180 MG tablet Take 180 mg by mouth daily.     Yes Historical Provider, MD  furosemide (LASIX) 20 MG tablet Take 20 mg by mouth 3 (three) times daily.   Yes Historical Provider, MD  insulin aspart (NOVOLOG) 100 UNIT/ML injection Inject 0-20 Units into the skin 3 (three) times daily with meals. 06/07/11  Yes Kathlen Mody, MD  insulin glargine (LANTUS) 100 UNIT/ML injection Inject 35 Units into the skin at bedtime.  06/07/11  Yes Kathlen Mody, MD  ketoconazole (NIZORAL) 2 % shampoo Apply 1 application topically 2 (two) times a week.   Yes Historical Provider, MD  losartan-hydrochlorothiazide (HYZAAR) 100-25 MG per tablet Take 1 tablet by mouth daily.   Yes Historical Provider, MD  potassium chloride SA (K-DUR,KLOR-CON) 20 MEQ tablet Take 20 mEq by mouth daily.   Yes Historical Provider, MD   BP 109/79  Pulse 103  Temp(Src) 97.9 F (36.6 C) (Axillary)  Resp 27  SpO2 96% Physical Exam  Nursing note and vitals reviewed. Constitutional: He is oriented to person, place, and time. He appears well-developed and well-nourished.  Non-toxic appearance. He does not appear ill. No distress.  HENT:  Head: Normocephalic and atraumatic.  Right Ear: External ear normal.  Left Ear: External ear normal.  Nose: Nose normal. No mucosal edema  or rhinorrhea.  Mouth/Throat: Oropharynx is clear and moist and mucous membranes are normal. No dental abscesses or uvula swelling.  Eyes: Conjunctivae and EOM are normal. Pupils are equal, round, and reactive to light.  Neck: Normal range of motion and full passive range of motion without pain. Neck supple.  Cardiovascular: Normal rate, regular rhythm and normal heart sounds.  Exam reveals no gallop and no friction rub.   No murmur heard. Pulmonary/Chest: Effort normal and breath sounds normal. No respiratory distress. He has no wheezes. He has no  rhonchi. He has no rales. He exhibits no tenderness and no crepitus.  Coughing during exam which sister states is his baseline  Abdominal: Soft. Normal appearance and bowel sounds are normal. He exhibits no distension. There is no tenderness. There is no rebound and no guarding.  Musculoskeletal: Normal range of motion. He exhibits no edema and no tenderness.  Moves all extremities well.   Neurological: He is alert and oriented to person, place, and time. He has normal strength. No cranial nerve deficit.  Skin: Skin is warm, dry and intact. No rash noted. No erythema. No pallor.  Face is flushed  Psychiatric: He has a normal mood and affect. His speech is normal and behavior is normal. His mood appears not anxious.  Mentally slow at baseline; argumentative but cooperative    ED Course  Procedures   Medications  potassium chloride 10 mEq in 100 mL IVPB (10 mEq Intravenous New Bag/Given 03/04/14 1630)  sodium chloride 0.9 % bolus 1,000 mL (0 mLs Intravenous Stopped 03/04/14 1641)  potassium chloride SA (K-DUR,KLOR-CON) CR tablet 40 mEq (40 mEq Oral Given 03/04/14 1630)  insulin aspart (novoLOG) injection 4 Units (4 Units Intravenous Given 03/04/14 1654)     DIAGNOSTIC STUDIES: Oxygen Saturation is 100% on RA, normal by my interpretation.    COORDINATION OF CARE: 2:30 PM Discussed treatment plan with pt at bedside and pt agreed to plan.  Patient was started on IV fluids and given IV insulin after reviewing his fingerstick CBG. After review of his laboratory results he also was given oral potassium because he has not had nausea or vomiting and IV potassium runs x2. I've discussed with his sister that he needs to be admitted overnight to correct his hypokalemia which is low enough to cause cardiac arrhythmia. She is in agreement.   17:27 Dr Konrad Dolores, admit to tele, obs, team 2   Labs Review Results for orders placed during the hospital encounter of 03/04/14  CBC WITH DIFFERENTIAL       Result Value Ref Range   WBC 9.8  4.0 - 10.5 K/uL   RBC 4.13 (*) 4.22 - 5.81 MIL/uL   Hemoglobin 12.7 (*) 13.0 - 17.0 g/dL   HCT 16.1 (*) 09.6 - 04.5 %   MCV 89.3  78.0 - 100.0 fL   MCH 30.8  26.0 - 34.0 pg   MCHC 34.4  30.0 - 36.0 g/dL   RDW 40.9  81.1 - 91.4 %   Platelets 264  150 - 400 K/uL   Neutrophils Relative % 79 (*) 43 - 77 %   Neutro Abs 7.8 (*) 1.7 - 7.7 K/uL   Lymphocytes Relative 12  12 - 46 %   Lymphs Abs 1.2  0.7 - 4.0 K/uL   Monocytes Relative 7  3 - 12 %   Monocytes Absolute 0.7  0.1 - 1.0 K/uL   Eosinophils Relative 1  0 - 5 %   Eosinophils Absolute 0.1  0.0 -  0.7 K/uL   Basophils Relative 1  0 - 1 %   Basophils Absolute 0.1  0.0 - 0.1 K/uL  COMPREHENSIVE METABOLIC PANEL      Result Value Ref Range   Sodium 122 (*) 137 - 147 mEq/L   Potassium 2.4 (*) 3.7 - 5.3 mEq/L   Chloride 75 (*) 96 - 112 mEq/L   CO2 28  19 - 32 mEq/L   Glucose, Bld 372 (*) 70 - 99 mg/dL   BUN 31 (*) 6 - 23 mg/dL   Creatinine, Ser 4.091.18  0.50 - 1.35 mg/dL   Calcium 9.0  8.4 - 81.110.5 mg/dL   Total Protein 7.3  6.0 - 8.3 g/dL   Albumin 3.9  3.5 - 5.2 g/dL   AST 22  0 - 37 U/L   ALT 12  0 - 53 U/L   Alkaline Phosphatase 92  39 - 117 U/L   Total Bilirubin 0.3  0.3 - 1.2 mg/dL   GFR calc non Af Amer 67 (*) >90 mL/min   GFR calc Af Amer 77 (*) >90 mL/min   Anion gap 19 (*) 5 - 15  URINALYSIS, ROUTINE W REFLEX MICROSCOPIC      Result Value Ref Range   Color, Urine YELLOW  YELLOW   APPearance CLEAR  CLEAR   Specific Gravity, Urine <1.005 (*) 1.005 - 1.030   pH 7.0  5.0 - 8.0   Glucose, UA 500 (*) NEGATIVE mg/dL   Hgb urine dipstick NEGATIVE  NEGATIVE   Bilirubin Urine NEGATIVE  NEGATIVE   Ketones, ur NEGATIVE  NEGATIVE mg/dL   Protein, ur NEGATIVE  NEGATIVE mg/dL   Urobilinogen, UA 0.2  0.0 - 1.0 mg/dL   Nitrite NEGATIVE  NEGATIVE   Leukocytes, UA NEGATIVE  NEGATIVE  MAGNESIUM      Result Value Ref Range   Magnesium 2.2  1.5 - 2.5 mg/dL  CBG MONITORING, ED      Result Value Ref  Range   Glucose-Capillary 412 (*) 70 - 99 mg/dL  CBG MONITORING, ED      Result Value Ref Range   Glucose-Capillary 290 (*) 70 - 99 mg/dL   Laboratory interpretation all normal except hypokalemia, hyponatremia, low chloride, of 80 BUN consistent with dehydration, metabolic acidosis with anion gap of 19, hyperglycemia     Imaging Review No results found.   EKG Interpretation   Date/Time:  Monday March 04 2014 16:52:00 EDT Ventricular Rate:  98 PR Interval:  140 QRS Duration: 98 QT Interval:  396 QTC Calculation: 506 R Axis:   81 Text Interpretation:  Sinus rhythm Inferior infarct, age indeterminate  Prolonged QT interval T waves amplitude is  less prominant than prior EKG  29 May 2011 Confirmed by Edger Husain  MD-I, Hughie Melroy ([id number]) on 03/04/2014 5:13:13 PM      MDM   Final diagnoses:  Hyperglycemia  Hypokalemia  Hyponatremia  Serum chloride decreased  Diabetic ketoacidosis without coma associated with type 2 diabetes mellitus    Plan admission   Devoria AlbeIva Brit Wernette, MD, FACEP   CRITICAL CARE Performed by: Devoria AlbeKNAPP,Emilliano Dilworth L Total critical care time: 34 min Critical care time was exclusive of separately billable procedures and treating other patients. Critical care was necessary to treat or prevent imminent or life-threatening deterioration. Critical care was time spent personally by me on the following activities: development of treatment plan with patient and/or surrogate as well as nursing, discussions with consultants, evaluation of patient's response to treatment, examination of patient, obtaining history from patient or  surrogate, ordering and performing treatments and interventions, ordering and review of laboratory studies, ordering and review of radiographic studies, pulse oximetry and re-evaluation of patient's condition.   I personally performed the services described in this documentation, which was scribed in my presence. The recorded information has been reviewed and  considered.  Devoria Albe, MD, FACEP      Ward Givens, MD 03/04/14 [phone]

## 2014-03-04 NOTE — ED Notes (Signed)
Potassium level of 2.4 Dr. Lynelle DoctorKnapp notified.

## 2014-03-04 NOTE — Progress Notes (Signed)
RN paged this NP secondary to pt's family/POA insisting on insertion of a Foley secondary to pt's frequent urination (incontinent due to MR) with DKA. They accept the risk of infection per RN. Will place Foley for 24 hours or until DKA resolved and large amounts of IVF are no longer required.

## 2014-03-05 DIAGNOSIS — E876 Hypokalemia: Secondary | ICD-10-CM | POA: Diagnosis not present

## 2014-03-05 DIAGNOSIS — E1310 Other specified diabetes mellitus with ketoacidosis without coma: Secondary | ICD-10-CM | POA: Diagnosis not present

## 2014-03-05 DIAGNOSIS — E1165 Type 2 diabetes mellitus with hyperglycemia: Secondary | ICD-10-CM | POA: Diagnosis not present

## 2014-03-05 DIAGNOSIS — R739 Hyperglycemia, unspecified: Secondary | ICD-10-CM | POA: Diagnosis not present

## 2014-03-05 LAB — BASIC METABOLIC PANEL
Anion gap: 11 (ref 5–15)
Anion gap: 11 (ref 5–15)
Anion gap: 13 (ref 5–15)
BUN: 13 mg/dL (ref 6–23)
BUN: 16 mg/dL (ref 6–23)
BUN: 22 mg/dL (ref 6–23)
CALCIUM: 8.5 mg/dL (ref 8.4–10.5)
CALCIUM: 9 mg/dL (ref 8.4–10.5)
CHLORIDE: 102 meq/L (ref 96–112)
CO2: 26 meq/L (ref 19–32)
CO2: 27 meq/L (ref 19–32)
CO2: 29 meq/L (ref 19–32)
CREATININE: 1.07 mg/dL (ref 0.50–1.35)
CREATININE: 1.19 mg/dL (ref 0.50–1.35)
Calcium: 8.5 mg/dL (ref 8.4–10.5)
Chloride: 104 mEq/L (ref 96–112)
Chloride: 105 mEq/L (ref 96–112)
Creatinine, Ser: 1.05 mg/dL (ref 0.50–1.35)
GFR calc Af Amer: 77 mL/min — ABNORMAL LOW (ref >90)
GFR calc Af Amer: 87 mL/min — ABNORMAL LOW (ref >90)
GFR calc non Af Amer: 66 mL/min — ABNORMAL LOW (ref >90)
GFR calc non Af Amer: 75 mL/min — ABNORMAL LOW (ref >90)
GFR, EST AFRICAN AMERICAN: 89 mL/min — AB (ref >90)
GFR, EST NON AFRICAN AMERICAN: 77 mL/min — AB (ref >90)
GLUCOSE: 283 mg/dL — AB (ref 70–99)
GLUCOSE: 324 mg/dL — AB (ref 70–99)
Glucose, Bld: 224 mg/dL — ABNORMAL HIGH (ref 70–99)
Potassium: 3.2 mEq/L — ABNORMAL LOW (ref 3.7–5.3)
Potassium: 3.4 mEq/L — ABNORMAL LOW (ref 3.7–5.3)
Potassium: 3.5 mEq/L — ABNORMAL LOW (ref 3.7–5.3)
Sodium: 142 mEq/L (ref 137–147)
Sodium: 142 mEq/L (ref 137–147)
Sodium: 144 mEq/L (ref 137–147)

## 2014-03-05 LAB — GLUCOSE, CAPILLARY
GLUCOSE-CAPILLARY: 177 mg/dL — AB (ref 70–99)
GLUCOSE-CAPILLARY: 203 mg/dL — AB (ref 70–99)
GLUCOSE-CAPILLARY: 282 mg/dL — AB (ref 70–99)
GLUCOSE-CAPILLARY: 314 mg/dL — AB (ref 70–99)
Glucose-Capillary: 296 mg/dL — ABNORMAL HIGH (ref 70–99)
Glucose-Capillary: 231 mg/dL — ABNORMAL HIGH (ref 70–99)
Glucose-Capillary: 331 mg/dL — ABNORMAL HIGH (ref 70–99)

## 2014-03-05 LAB — CBC
HEMATOCRIT: 35.7 % — AB (ref 39.0–52.0)
Hemoglobin: 12.3 g/dL — ABNORMAL LOW (ref 13.0–17.0)
MCH: 30.8 pg (ref 26.0–34.0)
MCHC: 34.5 g/dL (ref 30.0–36.0)
MCV: 89.5 fL (ref 78.0–100.0)
PLATELETS: 216 10*3/uL (ref 150–400)
RBC: 3.99 MIL/uL — ABNORMAL LOW (ref 4.22–5.81)
RDW: 12.2 % (ref 11.5–15.5)
WBC: 7 10*3/uL (ref 4.0–10.5)

## 2014-03-05 LAB — MRSA PCR SCREENING: MRSA BY PCR: NEGATIVE

## 2014-03-05 MED ORDER — INSULIN ASPART 100 UNIT/ML ~~LOC~~ SOLN
0.0000 [IU] | SUBCUTANEOUS | Status: DC
  Administered 2014-03-05: 15 [IU] via SUBCUTANEOUS

## 2014-03-05 MED ORDER — INSULIN ASPART 100 UNIT/ML ~~LOC~~ SOLN
0.0000 [IU] | SUBCUTANEOUS | Status: DC
  Administered 2014-03-05: 7 [IU] via SUBCUTANEOUS
  Administered 2014-03-05: 4 [IU] via SUBCUTANEOUS
  Administered 2014-03-05: 15 [IU] via SUBCUTANEOUS
  Administered 2014-03-06: 3 [IU] via SUBCUTANEOUS
  Administered 2014-03-06: 4 [IU] via SUBCUTANEOUS

## 2014-03-05 MED ORDER — POTASSIUM CHLORIDE CRYS ER 20 MEQ PO TBCR
40.0000 meq | EXTENDED_RELEASE_TABLET | Freq: Three times a day (TID) | ORAL | Status: DC
  Administered 2014-03-05 – 2014-03-06 (×3): 40 meq via ORAL
  Filled 2014-03-05 (×3): qty 2

## 2014-03-05 MED ORDER — POTASSIUM CHLORIDE CRYS ER 20 MEQ PO TBCR
40.0000 meq | EXTENDED_RELEASE_TABLET | Freq: Two times a day (BID) | ORAL | Status: DC
  Administered 2014-03-05: 40 meq via ORAL
  Filled 2014-03-05: qty 2

## 2014-03-05 NOTE — Progress Notes (Signed)
Inpatient Diabetes Program Recommendations  AACE/ADA: New Consensus Statement on Inpatient Glycemic Control (2013)  Target Ranges:  Prepandial:   less than 140 mg/dL      Peak postprandial:   less than 180 mg/dL (1-2 hours)      Critically ill patients:  140 - 180 mg/dL   Results for [name], [name] (MRN [id number]) as of 03/05/2014 08:10  Ref. Range 03/04/2014 13:25 03/04/2014 16:32 03/04/2014 20:12 03/05/2014 00:22 03/05/2014 05:07 03/05/2014 07:20  Glucose-Capillary Latest Range: 70-99 mg/dL 528412 (H) 413290 (H) 244275 (H) 282 (H) 296 (H) 231 (H)   Diabetes history: DM2 Outpatient Diabetes medications: Lantus 35 units QHS, Novolog 0-20 units TID with meals Current orders for Inpatient glycemic control: Levemir 20 units QHS, Novolog 0-24 units Q4H  Inpatient Diabetes Program Recommendations IV fluids: Ordered D5 LR with KCl 20 mEq at 125 ml/hr. Please re-evaluate need for dextrose in IV fluids. Insulin - Basal: Please consider changing basal insulin from Levemir to Lantus and increase to Lantus 35 units QHS. Insulin - Meal Coverage: May want to consider ordering Novolog meal coverage. HgbA1C: Please consider ordering an A1C to evaluate glycemic control over the past 2-3 months.  Thanks, Orlando PennerMarie Aubrie Lucien, RN, MSN, CCRN Diabetes Coordinator Inpatient Diabetes Program [phone] (Team Pager) [phone] (AP office) [phone] [facility] office)

## 2014-03-05 NOTE — Progress Notes (Signed)
UR completed 

## 2014-03-05 NOTE — Progress Notes (Signed)
[facility] PROGRESS NOTE  Jacob GavelGene R [name] ZOX:[id number] DOB: Jun 29, 1955 DOA: 03/04/2014 PCP: Cassell SmilesFUSCO,Jacob J., MD Interim summary: [name] is a 58 y.o. Male came in for DKA and hyponatremia.   Assessment/Plan: Diabetic ketoacidosis: - admitted to telemetry. cbg's are better controlled. Gap is closed. On lantus and SSI.    Hyponatremia: improved after NS fluids.    Hypokalemia: replete as needed.   Developmental Delay: at baseline per sister who is POA.  - monitor.  Hypertension:  Continue home meds      Code Status: full code Family Communication: family at bedside Disposition Plan: pending PT eval.   Consultants: none Procedures:  none  Antibiotics:  none  HPI/Subjective: Wants to go home, .   Objective: Filed Vitals:   03/05/14 0525  BP: 128/81  Pulse: 110  Temp: 98.6 F (37 C)  Resp: 18    Intake/Output Summary (Last 24 hours) at 03/05/14 1538 Last data filed at 03/05/14 1453  Gross per 24 hour  Intake 843.75 ml  Output   2250 ml  Net -1406.25 ml   Filed Weights   03/04/14 2033  Weight: 77 kg (169 lb 12.1 oz)    Exam:   General:  Alert afebrile anxious.  Cardiovascular: s1s2  Respiratory: CTAB  Abdomen: soft NT ND BS+  Musculoskeletal: no pedal edema.   Data Reviewed: Basic Metabolic Panel:  Recent Labs Lab 03/04/14 1430 03/05/14 0024 03/05/14 0702 03/05/14 1323  NA 122* 144 142 142  K 2.4* 3.5* 3.4* 3.2*  CL 75* 105 104 102  CO2 28 26 27 29   GLUCOSE 372* 324* 283* 224*  BUN 31* 22 16 13   CREATININE 1.18 1.19 1.07 1.05  CALCIUM 9.0 8.5 8.5 9.0  MG 2.2  --   --   --    Liver Function Tests:  Recent Labs Lab 03/04/14 1430  AST 22  ALT 12  ALKPHOS 92  BILITOT 0.3  PROT 7.3  ALBUMIN 3.9   No results found for this basename: LIPASE, AMYLASE,  in the last 168 hours No results found for this basename: AMMONIA,  in the last 168 hours CBC:  Recent Labs Lab 03/04/14 1430 03/05/14 0702  WBC 9.8  7.0  NEUTROABS 7.8*  --   HGB 12.7* 12.3*  HCT 36.9* 35.7*  MCV 89.3 89.5  PLT 264 216   Cardiac Enzymes: No results found for this basename: CKTOTAL, CKMB, CKMBINDEX, TROPONINI,  in the last 168 hours BNP (last 3 results) No results found for this basename: PROBNP,  in the last 8760 hours CBG:  Recent Labs Lab 03/04/14 2012 03/05/14 0022 03/05/14 0507 03/05/14 0720 03/05/14 1125  GLUCAP 275* 282* 296* 231* 314*    Recent Results (from the past 240 hour(s))  MRSA PCR SCREENING     Status: None   Collection Time    03/04/14 12:26 AM      Result Value Ref Range Status   MRSA by PCR NEGATIVE  NEGATIVE Final   Comment:            The GeneXpert MRSA Assay (FDA     approved for NASAL specimens     only), is one component of a     comprehensive MRSA colonization     surveillance program. It is not     intended to diagnose MRSA     infection nor to guide or     monitor treatment for     MRSA infections.     Studies: No results  found.  Scheduled Meds: . furosemide  20 mg Oral TID  . heparin  5,000 Units Subcutaneous 3 times per day  . hydrochlorothiazide  25 mg Oral Daily  . insulin aspart  0-20 Units Subcutaneous Q4H  . insulin detemir  20 Units Subcutaneous QHS  . losartan  100 mg Oral Daily  . potassium chloride  40 mEq Oral BID  . sodium chloride  3 mL Intravenous Q12H   Continuous Infusions:   Active Problems:   Hypokalemia    Time spent: 35 minutes.     [facility]  [facility] Pager [phone]. If 7PM-7AM, please contact night-coverage at [email or web address], password [facility] 03/05/2014, 3:38 PM  LOS: 1 day

## 2014-03-05 NOTE — Clinical Social Work Psychosocial (Signed)
Clinical Social Work Department BRIEF PSYCHOSOCIAL ASSESSMENT 03/05/2014  Patient:  [name], [name]     Account Number:  [id number]     Admit date:  03/04/2014  Clinical Social Worker:  Wyatt Haste  Date/Time:  03/05/2014 12:54 PM  Referred by:  CSW  Date Referred:  03/05/2014 Referred for  SNF Placement   Other Referral:   Interview type:  Patient Other interview type:   sister- Hassan Rowan    PSYCHOSOCIAL DATA Living Status:  FACILITY Admitted from facility:  Other Level of care:  [place] Primary support name:  Hassan Rowan Primary support relationship to patient:  SIBLING Degree of support available:   supportive    CURRENT CONCERNS Current Concerns  Post-Acute Placement   Other Concerns:    SOCIAL WORK ASSESSMENT / PLAN CSW met with pt and pt's sister, Hassan Rowan. Pt has MR and has been a resident at [facility] for almost 3 years now. Hassan Rowan appears to be very involved and supportive and visits pt daily. She was caring for pt and their mother at home prior to placement. Pt's mother is now his roommate at facility. Hassan Rowan states she just can't separate them. Pt said he looks after her. They report things have been going okay there. Per Hassan Rowan, pt has become more independent since being at [facility] from Home. They request return there at d/c. Per Vaughan Basta, administrator, pt requires extensive assist with bathing and toileting. He ambulates independently. No home health prior to admission. Pt is okay to return. Anticipate d/c tomorrow.   Assessment/plan status:  Psychosocial Support/Ongoing Assessment of Needs Other assessment/ plan:   Information/referral to community resources:   Home Away From Home    PATIENT'S/FAMILY'S RESPONSE TO PLAN OF CARE: Pt and Hassan Rowan request return to Home Away from Home as pt's mother is also a resident there. CSW will continue to follow.       Benay Pike, [place]

## 2014-03-05 NOTE — Progress Notes (Signed)
Pt's sister who is his guardian called to inform her that foley was not able to be inserted. Sister voices understanding and states she will see if he stays tomorrow before seeing if the doctor can insert foley.

## 2014-03-06 ENCOUNTER — Observation Stay (HOSPITAL_COMMUNITY): Payer: Medicare Other

## 2014-03-06 DIAGNOSIS — E1165 Type 2 diabetes mellitus with hyperglycemia: Secondary | ICD-10-CM | POA: Diagnosis not present

## 2014-03-06 DIAGNOSIS — R739 Hyperglycemia, unspecified: Secondary | ICD-10-CM | POA: Diagnosis not present

## 2014-03-06 DIAGNOSIS — E876 Hypokalemia: Secondary | ICD-10-CM | POA: Diagnosis not present

## 2014-03-06 DIAGNOSIS — E1310 Other specified diabetes mellitus with ketoacidosis without coma: Secondary | ICD-10-CM | POA: Diagnosis not present

## 2014-03-06 DIAGNOSIS — N200 Calculus of kidney: Secondary | ICD-10-CM | POA: Diagnosis not present

## 2014-03-06 DIAGNOSIS — I10 Essential (primary) hypertension: Secondary | ICD-10-CM | POA: Diagnosis not present

## 2014-03-06 LAB — HEMOGLOBIN A1C
HEMOGLOBIN A1C: 7.9 % — AB (ref <5.7)
Mean Plasma Glucose: 180 mg/dL — ABNORMAL HIGH (ref <117)

## 2014-03-06 LAB — GLUCOSE, CAPILLARY
GLUCOSE-CAPILLARY: 129 mg/dL — AB (ref 70–99)
Glucose-Capillary: 180 mg/dL — ABNORMAL HIGH (ref 70–99)

## 2014-03-06 MED ORDER — INSULIN GLARGINE 100 UNIT/ML ~~LOC~~ SOLN
35.0000 [IU] | Freq: Every day | SUBCUTANEOUS | Status: DC
  Filled 2014-03-06: qty 0.35

## 2014-03-06 MED ORDER — INSULIN GLARGINE 100 UNIT/ML ~~LOC~~ SOLN: 35.0000 [IU] | Freq: Every day | SUBCUTANEOUS | Status: DC

## 2014-03-06 MED ORDER — INSULIN ASPART 100 UNIT/ML ~~LOC~~ SOLN
5.0000 [IU] | Freq: Three times a day (TID) | SUBCUTANEOUS | Status: DC
  Administered 2014-03-06: 5 [IU] via SUBCUTANEOUS

## 2014-03-06 MED ORDER — INSULIN ASPART 100 UNIT/ML ~~LOC~~ SOLN: 5.0000 [IU] | Freq: Three times a day (TID) | SUBCUTANEOUS | Status: DC

## 2014-03-06 NOTE — Discharge Summary (Signed)
Physician Discharge Summary  FERENC BARTOLUCCI RKY:[id number] DOB: 05/31/1956 DOA: 03/04/2014  PCP: Cassell Smiles., MD  Admit date: 03/04/2014 Discharge date: 03/06/2014  Time spent: 35 minutes  Recommendations for Outpatient Follow-up:  Patient will be discharged to home away from home assisted living community. He is to follow up with his primary care physician within one week of discharge. Patient should also have his labs monitored within one week of discharge. Patient should continue taking his medications as prescribed. Patient should resume normal activity as tolerated. Patient should follow a heart healthy/modified diet.  Discharge Diagnoses:  Diabetic ketoacidosis Hyponatremia Hypokalemia Developmental delay Hypertension  Discharge Condition: Stable  Diet recommendation: Heart healthy/carb modified  Filed Weights   03/04/14 2033  Weight: 77 kg (169 lb 12.1 oz)    History of present illness:  On 03/04/2014 [name] is a 58 y.o. male with a history of hyperglycemia. Patient resides at care facility due to mental retardation. He was found to have a C home away from home BG near 500. PCP was called and was told to go the ER. Patient only complained of frequent urination. Admitting physician discussed care with pateint's sister, Mrs. Riki Sheer, who is his POA.   Hospital Course:  Diabetic ketoacidosis  -Patient initially admitted and placed on glucose stabilizer -His anion gap was monitored as well as his BMP, patient's gap did close and he was transitioned to Lantus as well as insulin sliding scale -Patient will be discharged with Lantus as well as sliding scale and meal coverage -He will need to follow up with his primary care physician within one week of discharge  Hyponatremia -Likely secondary to diabetic ketoacidosis -Resolved with IV fluids  Hypokalemia -Likely secondary to diuresis -Continue to replete and follow BMP within one week of  discharge  Developmental delay -Sister is at bedside who is his power of attorney -Stable -PT consulted and did not recommend further therapy  Hypertension -Continue home medications: Losartan/HCTZ  and Lasix  Procedures: None  Consultations: None  Discharge Exam: Filed Vitals:   03/05/14 2033  BP: 111/72  Pulse: 94  Temp: 98.3 F (36.8 C)  Resp: 20     General: Well nourished, NAD, appears stated age  HEENT: NCAT, mucous membranes moist.  Cardiovascular: S1 S2 auscultated, no rubs, murmurs or gallops. Regular rate and rhythm.  Respiratory: Clear to auscultation bilaterally with equal chest rise  Abdomen: Soft, nontender, nondistended, + bowel sounds  Extremities: warm dry without cyanosis clubbing or edema  Discharge Instructions      Discharge Instructions   Discharge instructions    Complete by:  As directed   Patient will be discharged to home away from home assisted living community. He is to follow up with his primary care physician within one week of discharge. Patient should also have his labs monitored within one week of discharge. Patient should continue taking his medications as prescribed. Patient should resume normal activity as tolerated. Patient should follow a heart healthy/modified diet.            Medication List         Cholecalciferol 1000 UNITS capsule  Take 1,000 Units by mouth daily.     fexofenadine 180 MG tablet  Commonly known as:  ALLEGRA  Take 180 mg by mouth daily.     furosemide 20 MG tablet  Commonly known as:  LASIX  Take 20 mg by mouth 3 (three) times daily.     insulin aspart 100 UNIT/ML injection  Commonly  known as:  novoLOG  Inject 0-20 Units into the skin 3 (three) times daily with meals.     insulin aspart 100 UNIT/ML injection  Commonly known as:  novoLOG  Inject 5 Units into the skin 3 (three) times daily with meals.     insulin glargine 100 UNIT/ML injection  Commonly known as:  LANTUS  Inject 35 Units  into the skin at bedtime.     ketoconazole 2 % shampoo  Commonly known as:  NIZORAL  Apply 1 application topically 2 (two) times a week.     losartan-hydrochlorothiazide 100-25 MG per tablet  Commonly known as:  HYZAAR  Take 1 tablet by mouth daily.     potassium chloride SA 20 MEQ tablet  Commonly known as:  K-DUR,KLOR-CON  Take 20 mEq by mouth daily.       No Known Allergies Follow-up Information   Follow up with Cassell Smiles., MD. Schedule an appointment as soon as possible for a visit in 1 week. [facility] followup, repeat BMP)    Specialty:  Internal Medicine   Contact information:   [address] [place] [address] [phone]        The results of significant diagnostics from this hospitalization (including imaging, microbiology, ancillary and laboratory) are listed below for reference.    Significant Diagnostic Studies: No results found.  Microbiology: Recent Results (from the past 240 hour(s))  MRSA PCR SCREENING     Status: None   Collection Time    03/04/14 12:26 AM      Result Value Ref Range Status   MRSA by PCR NEGATIVE  NEGATIVE Final   Comment:            The GeneXpert MRSA Assay (FDA     approved for NASAL specimens     only), is one component of a     comprehensive MRSA colonization     surveillance program. It is not     intended to diagnose MRSA     infection nor to guide or     monitor treatment for     MRSA infections.     Labs: Basic Metabolic Panel:  Recent Labs Lab 03/04/14 1430 03/05/14 0024 03/05/14 0702 03/05/14 1323  NA 122* 144 142 142  K 2.4* 3.5* 3.4* 3.2*  CL 75* 105 104 102  CO2 28 26 27 29   GLUCOSE 372* 324* 283* 224*  BUN 31* 22 16 13   CREATININE 1.18 1.19 1.07 1.05  CALCIUM 9.0 8.5 8.5 9.0  MG 2.2  --   --   --    Liver Function Tests:  Recent Labs Lab 03/04/14 1430  AST 22  ALT 12  ALKPHOS 92  BILITOT 0.3  PROT 7.3  ALBUMIN 3.9   No results found for this basename: LIPASE, AMYLASE,   in the last 168 hours No results found for this basename: AMMONIA,  in the last 168 hours CBC:  Recent Labs Lab 03/04/14 1430 03/05/14 0702  WBC 9.8 7.0  NEUTROABS 7.8*  --   HGB 12.7* 12.3*  HCT 36.9* 35.7*  MCV 89.3 89.5  PLT 264 216   Cardiac Enzymes: No results found for this basename: CKTOTAL, CKMB, CKMBINDEX, TROPONINI,  in the last 168 hours BNP: BNP (last 3 results) No results found for this basename: PROBNP,  in the last 8760 hours CBG:  Recent Labs Lab 03/05/14 1555 03/05/14 2028 03/05/14 2347 03/06/14 0414 03/06/14 0739  GLUCAP 203* 331* 177* 129* 180*     Signed:  Edsel Petrin  [facility] 03/06/2014, 10:19 AM

## 2014-03-06 NOTE — Clinical Social Work Note (Signed)
Pt d/c today back to [facility]. Pt, pt's sister, and facility aware and agreeable. Pt's sister to provide transport.  Derenda FennelKara Benyamin Jeff, [place] [phone]

## 2014-03-06 NOTE — Evaluation (Signed)
Physical Therapy Evaluation Patient Details Name: [name] MRN: [id number] DOB: July 31, 1955 Today's Date: 03/06/2014   History of Present Illness  [name] is a 59 y.o. male came to [facility] ed 03/04/2014 with  Hyperglycemia. Pt stays at care facility due to MR.   Clinical Impression  Pt is a 58 year old male who presents to PT with dx of hypokalemia.  Pt lives at a care center secondary to MR, though per sister who was present during evaluation, pt is relatively (I) with mobility skills.  During evaluation, pt was mod (I) with bed mobility skills and transfers, and supervision without AD for gait of 35 feet.  Noted decreased step length/foot clearance (B) with decreased cadence, though sister reports this is pt typical gait pattern.  No LOB or instability noted with gait.  Per family, pt appears at baseline level of function.  Pt typically amb household distances only prior to a seated rest break.  Pt to be d/c from acute PT services as pt is at baseline; no additional PT services or DME recommended.      Follow Up Recommendations No PT follow up    Equipment Recommendations  None recommended by PT       Precautions / Restrictions Precautions Precautions: Fall Precaution Comments: Dx of MR Restrictions Weight Bearing Restrictions: No      Mobility  Bed Mobility Overal bed mobility: Modified Independent                Transfers Overall transfer level: Modified independent                  Ambulation/Gait Ambulation/Gait assistance: Supervision Ambulation Distance (Feet): 35 Feet Assistive device: None Gait Pattern/deviations: Decreased stride length;Decreased dorsiflexion - right;Decreased dorsiflexion - left;Trunk flexed   Gait velocity interpretation: Below normal speed for age/gender        Balance Overall balance assessment: No apparent balance deficits (not formally assessed)                                           Pertinent  Vitals/Pain Pain Assessment: No/denies pain    Home Living Family/patient expects to be discharged to:: Assisted living               Home Equipment: None Additional Comments: Per sister, pt lives in a care facility with his mother who has Alzheimers Dementia.      Prior Function Level of Independence: Needs assistance;Independent   Gait / Transfers Assistance Needed: Per sister, pt is (I) with bed mobility skills, transfers, and ambulation skills in the home.   ADL's / Homemaking Assistance Needed: Per sister/social worker notes, pt requires extensive assist with toileting/bathing.            Extremity/Trunk Assessment               Lower Extremity Assessment: Overall WFL for tasks assessed         Communication      Cognition Arousal/Alertness: Awake/alert Behavior During Therapy: WFL for tasks assessed/performed Overall Cognitive Status: History of cognitive impairments - at baseline                        Assessment/Plan    PT Assessment Patent does not need any further PT services  PT Diagnosis     PT Problem List  PT Treatment Interventions     PT Goals (Current goals can be found in the Care Plan section) Acute Rehab PT Goals PT Goal Formulation: No goals set, d/c therapy     End of Session Equipment Utilized During Treatment: Gait belt Activity Tolerance: Patient tolerated treatment well Patient left: in bed;with call bell/phone within reach;with family/visitor present      Functional Limitation: Mobility: Walking and moving around Mobility: Walking and Moving Around Current Status (Z6109): At least 1 percent but less than 20 percent impaired, limited or restricted Mobility: Walking and Moving Around Goal Status [phone]): At least 1 percent but less than 20 percent impaired, limited or restricted Mobility: Walking and Moving Around Discharge Status [phone]): At least 1 percent but less than 20 percent impaired, limited or restricted     Time: 0914-0928 PT Time Calculation (min): 14 min   Charges:   PT Evaluation $Initial PT Evaluation Tier I: 1 Procedure     PT G Codes:     Functional Limitation: Mobility: Walking and moving around    [facility] 03/06/2014, 9:40 AM

## 2014-03-06 NOTE — Progress Notes (Signed)
Patient with orders to be discharge back to home away from home. Discharge packet sent with patient. Prescriptions given. Discharge instruction given to patient's mom, verbalized understanding. Patient stable. Patient left in private vehicle with mom.

## 2014-03-12 ENCOUNTER — Ambulatory Visit (INDEPENDENT_AMBULATORY_CARE_PROVIDER_SITE_OTHER): Payer: Medicare Other | Admitting: Urology

## 2014-03-12 DIAGNOSIS — N320 Bladder-neck obstruction: Secondary | ICD-10-CM | POA: Diagnosis not present

## 2014-03-12 DIAGNOSIS — N200 Calculus of kidney: Secondary | ICD-10-CM

## 2014-03-13 DIAGNOSIS — Z6828 Body mass index (BMI) 28.0-28.9, adult: Secondary | ICD-10-CM | POA: Diagnosis not present

## 2014-03-13 DIAGNOSIS — E871 Hypo-osmolality and hyponatremia: Secondary | ICD-10-CM | POA: Diagnosis not present

## 2014-03-13 DIAGNOSIS — E119 Type 2 diabetes mellitus without complications: Secondary | ICD-10-CM | POA: Diagnosis not present

## 2014-03-13 DIAGNOSIS — E876 Hypokalemia: Secondary | ICD-10-CM | POA: Diagnosis not present

## 2014-03-22 DIAGNOSIS — E1142 Type 2 diabetes mellitus with diabetic polyneuropathy: Secondary | ICD-10-CM | POA: Diagnosis not present

## 2014-03-22 DIAGNOSIS — L609 Nail disorder, unspecified: Secondary | ICD-10-CM | POA: Diagnosis not present

## 2014-04-10 ENCOUNTER — Emergency Department (HOSPITAL_COMMUNITY)
Admission: EM | Admit: 2014-04-10 | Discharge: 2014-04-10 | Disposition: A | Discharge status: Home / Self Care | Payer: Medicare Other | Attending: Emergency Medicine | Admitting: Emergency Medicine

## 2014-04-10 ENCOUNTER — Encounter (HOSPITAL_COMMUNITY): Payer: Self-pay | Admitting: *Deleted

## 2014-04-10 DIAGNOSIS — N289 Disorder of kidney and ureter, unspecified: Secondary | ICD-10-CM | POA: Insufficient documentation

## 2014-04-10 DIAGNOSIS — Z8619 Personal history of other infectious and parasitic diseases: Secondary | ICD-10-CM | POA: Insufficient documentation

## 2014-04-10 DIAGNOSIS — Z8659 Personal history of other mental and behavioral disorders: Secondary | ICD-10-CM | POA: Insufficient documentation

## 2014-04-10 DIAGNOSIS — Z79899 Other long term (current) drug therapy: Secondary | ICD-10-CM | POA: Insufficient documentation

## 2014-04-10 DIAGNOSIS — E1165 Type 2 diabetes mellitus with hyperglycemia: Secondary | ICD-10-CM | POA: Insufficient documentation

## 2014-04-10 DIAGNOSIS — I10 Essential (primary) hypertension: Secondary | ICD-10-CM | POA: Diagnosis not present

## 2014-04-10 DIAGNOSIS — Z87440 Personal history of urinary (tract) infections: Secondary | ICD-10-CM | POA: Insufficient documentation

## 2014-04-10 DIAGNOSIS — E876 Hypokalemia: Secondary | ICD-10-CM | POA: Insufficient documentation

## 2014-04-10 DIAGNOSIS — R739 Hyperglycemia, unspecified: Secondary | ICD-10-CM

## 2014-04-10 DIAGNOSIS — Z794 Long term (current) use of insulin: Secondary | ICD-10-CM | POA: Diagnosis not present

## 2014-04-10 LAB — I-STAT CHEM 8, ED
BUN: 28 mg/dL — ABNORMAL HIGH (ref 6–23)
CHLORIDE: 99 meq/L (ref 96–112)
Calcium, Ion: 1.15 mmol/L (ref 1.12–1.23)
Creatinine, Ser: 1.6 mg/dL — ABNORMAL HIGH (ref 0.50–1.35)
Glucose, Bld: 284 mg/dL — ABNORMAL HIGH (ref 70–99)
HEMATOCRIT: 34 % — AB (ref 39.0–52.0)
Hemoglobin: 11.6 g/dL — ABNORMAL LOW (ref 13.0–17.0)
POTASSIUM: 3.1 meq/L — AB (ref 3.7–5.3)
SODIUM: 139 meq/L (ref 137–147)
TCO2: 26 mmol/L (ref 0–100)

## 2014-04-10 LAB — CBG MONITORING, ED
Glucose-Capillary: 293 mg/dL — ABNORMAL HIGH (ref 70–99)
Glucose-Capillary: 286 mg/dL — ABNORMAL HIGH (ref 70–99)

## 2014-04-10 MED ORDER — SODIUM CHLORIDE 0.9 % IV BOLUS (SEPSIS)
1000.0000 mL | Freq: Once | INTRAVENOUS | Status: AC
  Administered 2014-04-10: 1000 mL via INTRAVENOUS

## 2014-04-10 NOTE — ED Notes (Addendum)
Pt to department via EMS.  Pt from group home.  Per report, pt has eaten a great deal of junk food today and blood sugar was elevated.  CBG 388 on scene per EMS.   Pt given 6 units novalog aprox 6 pm.

## 2014-04-10 NOTE — ED Provider Notes (Signed)
CSN: [id number]     Arrival date & time 04/10/14  1917 History   First MD Initiated Contact with Patient 04/10/14 1927     Chief Complaint  Patient presents with  . Hyperglycemia   HPI The patient was sent to the emergency room after an episode of hyperglycemia today. Patient has history of MR. He resides in a group home. Family members are not sure what he ate today. Apparently at the group home he had a blood sugar that was elevated in the 400s. He was given 6 units of NovoLog at 6 PM. EMS was contacted to bring him to the emergency department. He arrived his blood sugar was down to 388. She denies any complaints. He wants to go home. He denies nausea vomiting fevers chills chest pain or shortness of breath. Past Medical History  Diagnosis Date  . MR (mental retardation)   . Hypertension   . UTI (urinary tract infection)   . Diabetes mellitus   . DKA (diabetic ketoacidoses)   . Gram-positive bacteremia   . Hypernatremia   . Sepsis(995.91)   . Renal disorder   . Hypokalemia    History reviewed. No pertinent past surgical history. History reviewed. No pertinent family history. History  Substance Use Topics  . Smoking status: Never Smoker   . Smokeless tobacco: Never Used  . Alcohol Use: No    Review of Systems  All other systems reviewed and are negative.     Allergies  Review of patient's allergies indicates no known allergies.  Home Medications   Prior to Admission medications   Medication Sig Start Date End Date Taking? Authorizing Provider  Cholecalciferol 1000 UNITS capsule Take 1,000 Units by mouth daily.    Historical Provider, MD  fexofenadine (ALLEGRA) 180 MG tablet Take 180 mg by mouth daily.      Historical Provider, MD  furosemide (LASIX) 20 MG tablet Take 20 mg by mouth 3 (three) times daily.    Historical Provider, MD  insulin aspart (NOVOLOG) 100 UNIT/ML injection Inject 0-20 Units into the skin 3 (three) times daily with meals. 06/07/11   Kathlen ModyVijaya Akula, MD   insulin aspart (NOVOLOG) 100 UNIT/ML injection Inject 5 Units into the skin 3 (three) times daily with meals. 03/06/14   Maryann Mikhail, DO  insulin glargine (LANTUS) 100 UNIT/ML injection Inject 0.35 mLs (35 Units total) into the skin at bedtime. 03/06/14   Maryann Mikhail, DO  ketoconazole (NIZORAL) 2 % shampoo Apply 1 application topically 2 (two) times a week.    Historical Provider, MD  losartan-hydrochlorothiazide (HYZAAR) 100-25 MG per tablet Take 1 tablet by mouth daily.    Historical Provider, MD  potassium chloride SA (K-DUR,KLOR-CON) 20 MEQ tablet Take 20 mEq by mouth daily.    Historical Provider, MD   BP 106/86 mmHg  Pulse 120  Temp(Src) 99.2 F (37.3 C) (Oral)  Resp 16  Wt 169 lb (76.658 kg)  SpO2 100% Physical Exam  Constitutional: He appears well-developed and well-nourished. No distress.  HENT:  Head: Normocephalic and atraumatic.  Right Ear: External ear normal.  Left Ear: External ear normal.  Eyes: Conjunctivae are normal. Right eye exhibits no discharge. Left eye exhibits no discharge. No scleral icterus.  Neck: Neck supple. No tracheal deviation present.  Cardiovascular: Normal rate, regular rhythm and intact distal pulses.   Pulmonary/Chest: Effort normal and breath sounds normal. No stridor. No respiratory distress. He has no wheezes. He has no rales.  Abdominal: Soft. Bowel sounds are normal. He exhibits no  distension. There is no tenderness. There is no rebound and no guarding.  Musculoskeletal: He exhibits no edema or tenderness.  Neurological: He is alert. He has normal strength. No cranial nerve deficit (no facial droop, extraocular movements intact, no slurred speech) or sensory deficit. He exhibits normal muscle tone. He displays no seizure activity. Coordination normal.  Skin: Skin is warm and dry. No rash noted.  Psychiatric: He has a normal mood and affect.  Nursing note and vitals reviewed.   ED Course  Procedures (including critical care  time) Labs Review Labs Reviewed  CBG MONITORING, ED - Abnormal; Notable for the following:    Glucose-Capillary 293 (*)    All other components within normal limits  I-STAT CHEM 8, ED - Abnormal; Notable for the following:    Potassium 3.1 (*)    BUN 28 (*)    Creatinine, Ser 1.60 (*)    Glucose, Bld 284 (*)    Hemoglobin 11.6 (*)    HCT 34.0 (*)    All other components within normal limits  CBG MONITORING, ED - Abnormal; Notable for the following:    Glucose-Capillary 286 (*)    All other components within normal limits      MDM   Final diagnoses:  Hyperglycemia  Renal insufficiency    Pt's blood sugar was elevated but no signs of DKA.  It continued to decrease after the additional insulin given prior to arrival.  Discussed follow up with pcp to recheck the creatinine.  Monitor blood sugar and adhere to diet.    Linwood DibblesJon Earma Nicolaou, MD 04/10/14 2121

## 2014-04-10 NOTE — ED Notes (Signed)
Pt ambulated to bathroom independently. Nad noted.

## 2014-04-10 NOTE — Discharge Instructions (Signed)

## 2014-04-14 ENCOUNTER — Encounter (HOSPITAL_COMMUNITY): Payer: Self-pay | Admitting: *Deleted

## 2014-04-14 ENCOUNTER — Emergency Department (HOSPITAL_COMMUNITY)
Admission: EM | Admit: 2014-04-14 | Discharge: 2014-04-14 | Disposition: A | Discharge status: Home / Self Care | Payer: Medicare Other | Attending: Emergency Medicine | Admitting: Emergency Medicine

## 2014-04-14 DIAGNOSIS — Z79899 Other long term (current) drug therapy: Secondary | ICD-10-CM | POA: Diagnosis not present

## 2014-04-14 DIAGNOSIS — I10 Essential (primary) hypertension: Secondary | ICD-10-CM | POA: Insufficient documentation

## 2014-04-14 DIAGNOSIS — Z87448 Personal history of other diseases of urinary system: Secondary | ICD-10-CM | POA: Insufficient documentation

## 2014-04-14 DIAGNOSIS — E1165 Type 2 diabetes mellitus with hyperglycemia: Secondary | ICD-10-CM | POA: Diagnosis not present

## 2014-04-14 DIAGNOSIS — Z794 Long term (current) use of insulin: Secondary | ICD-10-CM | POA: Diagnosis not present

## 2014-04-14 DIAGNOSIS — Z87440 Personal history of urinary (tract) infections: Secondary | ICD-10-CM | POA: Insufficient documentation

## 2014-04-14 DIAGNOSIS — E876 Hypokalemia: Secondary | ICD-10-CM

## 2014-04-14 DIAGNOSIS — Z8619 Personal history of other infectious and parasitic diseases: Secondary | ICD-10-CM | POA: Diagnosis not present

## 2014-04-14 DIAGNOSIS — R739 Hyperglycemia, unspecified: Secondary | ICD-10-CM

## 2014-04-14 DIAGNOSIS — F79 Unspecified intellectual disabilities: Secondary | ICD-10-CM | POA: Diagnosis not present

## 2014-04-14 LAB — COMPREHENSIVE METABOLIC PANEL
ALT: 8 U/L (ref 0–53)
AST: 15 U/L (ref 0–37)
Albumin: 4.1 g/dL (ref 3.5–5.2)
Alkaline Phosphatase: 66 U/L (ref 39–117)
Anion gap: 15 (ref 5–15)
BUN: 26 mg/dL — ABNORMAL HIGH (ref 6–23)
CO2: 31 mEq/L (ref 19–32)
Calcium: 10.6 mg/dL — ABNORMAL HIGH (ref 8.4–10.5)
Chloride: 91 mEq/L — ABNORMAL LOW (ref 96–112)
Creatinine, Ser: 1.34 mg/dL (ref 0.50–1.35)
GFR calc Af Amer: 66 mL/min — ABNORMAL LOW (ref >90)
GFR calc non Af Amer: 57 mL/min — ABNORMAL LOW (ref >90)
Glucose, Bld: 326 mg/dL — ABNORMAL HIGH (ref 70–99)
Potassium: 3 mEq/L — ABNORMAL LOW (ref 3.7–5.3)
Sodium: 137 mEq/L (ref 137–147)
Total Bilirubin: 0.3 mg/dL (ref 0.3–1.2)
Total Protein: 7.7 g/dL (ref 6.0–8.3)

## 2014-04-14 LAB — CBC
HCT: 40.5 % (ref 39.0–52.0)
Hemoglobin: 14.1 g/dL (ref 13.0–17.0)
MCH: 30.8 pg (ref 26.0–34.0)
MCHC: 34.8 g/dL (ref 30.0–36.0)
MCV: 88.4 fL (ref 78.0–100.0)
Platelets: 277 10*3/uL (ref 150–400)
RBC: 4.58 MIL/uL (ref 4.22–5.81)
RDW: 12.2 % (ref 11.5–15.5)
WBC: 10 10*3/uL (ref 4.0–10.5)

## 2014-04-14 LAB — URINALYSIS, ROUTINE W REFLEX MICROSCOPIC
Bilirubin Urine: NEGATIVE
Glucose, UA: 500 mg/dL — AB
Hgb urine dipstick: NEGATIVE
Ketones, ur: NEGATIVE mg/dL
Leukocytes, UA: NEGATIVE
Nitrite: NEGATIVE
Protein, ur: NEGATIVE mg/dL
Specific Gravity, Urine: 1.015 (ref 1.005–1.030)
Urobilinogen, UA: 0.2 mg/dL (ref 0.0–1.0)
pH: 6.5 (ref 5.0–8.0)

## 2014-04-14 LAB — CBG MONITORING, ED
Glucose-Capillary: 262 mg/dL — ABNORMAL HIGH (ref 70–99)
Glucose-Capillary: 194 mg/dL — ABNORMAL HIGH (ref 70–99)

## 2014-04-14 MED ORDER — POTASSIUM CHLORIDE 10 MEQ/100ML IV SOLN
10.0000 meq | Freq: Once | INTRAVENOUS | Status: AC
  Administered 2014-04-14: 10 meq via INTRAVENOUS
  Filled 2014-04-14: qty 100

## 2014-04-14 MED ORDER — SODIUM CHLORIDE 0.9 % IV SOLN
Freq: Once | INTRAVENOUS | Status: AC
  Administered 2014-04-14: 13:00:00 via INTRAVENOUS

## 2014-04-14 MED ORDER — INSULIN ASPART 100 UNIT/ML ~~LOC~~ SOLN
12.0000 [IU] | Freq: Once | SUBCUTANEOUS | Status: AC
  Administered 2014-04-14: 12 [IU] via INTRAVENOUS
  Filled 2014-04-14: qty 1

## 2014-04-14 MED ORDER — POTASSIUM CHLORIDE CRYS ER 20 MEQ PO TBCR
60.0000 meq | EXTENDED_RELEASE_TABLET | Freq: Once | ORAL | Status: AC
  Administered 2014-04-14: 60 meq via ORAL
  Filled 2014-04-14: qty 3

## 2014-04-14 MED ORDER — SODIUM CHLORIDE 0.9 % IV BOLUS (SEPSIS)
1000.0000 mL | Freq: Once | INTRAVENOUS | Status: AC
  Administered 2014-04-14: 1000 mL via INTRAVENOUS

## 2014-04-14 NOTE — ED Notes (Signed)
Pt with MR who lives at [facility]".  CBG was 488 this am.  Pt was given "30units of insulin" at 11AM (type of insulin unknown).  Pt then had CBG 392 at 11:45 when fire department checked him.  Pt had CBG 411 for ems en route.  Pt is alert and per his norm, ambulatory to the bathroom on arrival with EMS.  No distress noted

## 2014-04-14 NOTE — ED Notes (Signed)
Pt given urinal, notified that urine sample is needed.

## 2014-04-14 NOTE — Discharge Instructions (Signed)

## 2014-04-14 NOTE — ED Notes (Signed)
Pt is here by EMS for CBG 411.

## 2014-04-14 NOTE — ED Notes (Signed)
Please note that meds on MAR from facility are signed for today 11/15 for all time slots (am, noon and pm doses) and also for tomorrows am doses.  Pharm tech attempting to call facility to verify this

## 2014-04-14 NOTE — ED Provider Notes (Signed)
CSN: [id number]     Arrival date & time 04/14/14  1242 History  This chart was scribed for Raeford RazorStephen Dominique Calvey, MD by Gwenyth Oberatherine Macek, ED Scribe. This patient was seen in room APA07/APA07 and the patient's care was started at 1:38 PM.   Chief Complaint  Patient presents with  . Hyperglycemia   The history is provided by the patient and a relative. No language interpreter was used.    HPI Comments: [name] is a 58 y.o. male with history of MR and HTN who presents to the Emergency Department complaining of hyperglycemia. His sister states CBG of 485 this morning and notes he was administered 30 units of insulin. Per EMS, CBG was 411 PTA. His sister states frequency of urination as an associated symptom. Pt was in the ED on 11/11 for the same symptoms. [facility] and has been administered medication regularly. He has history of denying medication, but sister has no knowledge that he has refused medication lately. His sister reports that she provides fruit as snacks, but that he has access to candy and sugary foods from the dining facilities at living facility. Pt denies pain and thirst as associated symptoms.   Past Medical History  Diagnosis Date  . MR (mental retardation)   . Hypertension   . UTI (urinary tract infection)   . Diabetes mellitus   . DKA (diabetic ketoacidoses)   . Gram-positive bacteremia   . Hypernatremia   . Sepsis(995.91)   . Renal disorder   . Hypokalemia    History reviewed. No pertinent past surgical history. No family history on file. History  Substance Use Topics  . Smoking status: Never Smoker   . Smokeless tobacco: Never Used  . Alcohol Use: No    Review of Systems  Constitutional: Negative for activity change and appetite change.  Gastrointestinal: Negative for abdominal pain.  Endocrine: Negative for polydipsia.  Genitourinary: Positive for frequency. Negative for difficulty urinating.  Neurological: Negative for headaches.   Psychiatric/Behavioral: Negative for confusion.  All other systems reviewed and are negative.   Allergies  Review of patient's allergies indicates no known allergies.  Home Medications   Prior to Admission medications   Medication Sig Start Date End Date Taking? Authorizing Provider  Cholecalciferol 1000 UNITS capsule Take 1,000 Units by mouth daily.   Yes Historical Provider, MD  fexofenadine (ALLEGRA) 180 MG tablet Take 180 mg by mouth daily.     Yes Historical Provider, MD  furosemide (LASIX) 20 MG tablet Take 20 mg by mouth 3 (three) times daily.   Yes Historical Provider, MD  insulin aspart (NOVOLOG) 100 UNIT/ML injection Inject 5 Units into the skin 3 (three) times daily with meals. 03/06/14  Yes Maryann Mikhail, DO  insulin glargine (LANTUS) 100 UNIT/ML injection Inject 0.35 mLs (35 Units total) into the skin at bedtime. 03/06/14  Yes Maryann Mikhail, DO  ketoconazole (NIZORAL) 2 % shampoo Apply 1 application topically 3 (three) times a week.    Yes Historical Provider, MD  losartan-hydrochlorothiazide (HYZAAR) 100-25 MG per tablet Take 1 tablet by mouth daily.   Yes Historical Provider, MD  potassium chloride SA (K-DUR,KLOR-CON) 20 MEQ tablet Take 20 mEq by mouth daily.   Yes Historical Provider, MD   BP 123/89 mmHg  Pulse 127  Temp(Src) 98.1 F (36.7 C) (Oral)  Resp 18  Ht 5\' 7"  (1.702 m)  Wt 169 lb (76.658 kg)  BMI 26.46 kg/m2  SpO2 99% Physical Exam  Constitutional: He is  oriented to person, place, and time. He appears well-developed and well-nourished.  HENT:  Head: Normocephalic and atraumatic.  Eyes: EOM are normal.  Neck: Normal range of motion.  Cardiovascular: Normal rate, regular rhythm, normal heart sounds and intact distal pulses.   Pulmonary/Chest: Effort normal and breath sounds normal. No respiratory distress.  Abdominal: Soft. He exhibits no distension. There is no tenderness.  Musculoskeletal: Normal range of motion.  Mild pitting lower extremity edema   Neurological: He is alert and oriented to person, place, and time.  Skin: Skin is warm and dry.  Psychiatric: He has a normal mood and affect. Judgment normal.  Nursing note and vitals reviewed.   ED Course  Procedures (including critical care time)  DIAGNOSTIC STUDIES: Oxygen Saturation is 99% on RA, normal by my interpretation.    COORDINATION OF CARE: 2:01 PM Discussed treatment plan which includes lab work with pt's sister and she agreed to plan.  Labs Review Labs Reviewed  COMPREHENSIVE METABOLIC PANEL - Abnormal; Notable for the following:    Potassium 3.0 (*)    Chloride 91 (*)    Glucose, Bld 326 (*)    BUN 26 (*)    Calcium 10.6 (*)    GFR calc non Af Amer 57 (*)    GFR calc Af Amer 66 (*)    All other components within normal limits  URINALYSIS, ROUTINE W REFLEX MICROSCOPIC - Abnormal; Notable for the following:    Glucose, UA 500 (*)    All other components within normal limits  CBG MONITORING, ED - Abnormal; Notable for the following:    Glucose-Capillary 262 (*)    All other components within normal limits  CBG MONITORING, ED - Abnormal; Notable for the following:    Glucose-Capillary 194 (*)    All other components within normal limits  CBC    Imaging Review No results found.   EKG Interpretation None      MDM   Final diagnoses:  Hyperglycemia  Hypokalemia    58 year old male with hyperglycemia without metabolic acidosis, anion gap or ketonuria. Improved prior to discharge. Outpatient follow-up.   return precautions discussed.  I personally preformed the services scribed in my presence. The recorded information has been reviewed is accurate. Raeford RazorStephen Ellanor Feuerstein, MD.    Raeford RazorStephen Letta Cargile, MD 04/18/14 [phone]

## 2014-04-15 ENCOUNTER — Emergency Department (HOSPITAL_COMMUNITY)
Admission: EM | Admit: 2014-04-15 | Discharge: 2014-04-15 | Disposition: A | Discharge status: Home / Self Care | Payer: Medicare Other | Attending: Emergency Medicine | Admitting: Emergency Medicine

## 2014-04-15 ENCOUNTER — Encounter (HOSPITAL_COMMUNITY): Payer: Self-pay | Admitting: *Deleted

## 2014-04-15 DIAGNOSIS — Z794 Long term (current) use of insulin: Secondary | ICD-10-CM | POA: Insufficient documentation

## 2014-04-15 DIAGNOSIS — F79 Unspecified intellectual disabilities: Secondary | ICD-10-CM | POA: Insufficient documentation

## 2014-04-15 DIAGNOSIS — R739 Hyperglycemia, unspecified: Secondary | ICD-10-CM | POA: Diagnosis not present

## 2014-04-15 DIAGNOSIS — E119 Type 2 diabetes mellitus without complications: Secondary | ICD-10-CM | POA: Insufficient documentation

## 2014-04-15 DIAGNOSIS — E1165 Type 2 diabetes mellitus with hyperglycemia: Secondary | ICD-10-CM | POA: Diagnosis not present

## 2014-04-15 DIAGNOSIS — R404 Transient alteration of awareness: Secondary | ICD-10-CM | POA: Diagnosis not present

## 2014-04-15 DIAGNOSIS — E876 Hypokalemia: Secondary | ICD-10-CM | POA: Insufficient documentation

## 2014-04-15 DIAGNOSIS — Z8619 Personal history of other infectious and parasitic diseases: Secondary | ICD-10-CM | POA: Diagnosis not present

## 2014-04-15 DIAGNOSIS — Z79899 Other long term (current) drug therapy: Secondary | ICD-10-CM | POA: Diagnosis not present

## 2014-04-15 DIAGNOSIS — Z87440 Personal history of urinary (tract) infections: Secondary | ICD-10-CM | POA: Insufficient documentation

## 2014-04-15 DIAGNOSIS — I10 Essential (primary) hypertension: Secondary | ICD-10-CM | POA: Insufficient documentation

## 2014-04-15 LAB — CBC WITH DIFFERENTIAL/PLATELET
BASOS ABS: 0.1 10*3/uL (ref 0.0–0.1)
Basophils Relative: 1 % (ref 0–1)
Eosinophils Absolute: 0.1 10*3/uL (ref 0.0–0.7)
Eosinophils Relative: 1 % (ref 0–5)
HCT: 39.3 % (ref 39.0–52.0)
HEMOGLOBIN: 13.6 g/dL (ref 13.0–17.0)
LYMPHS PCT: 12 % (ref 12–46)
Lymphs Abs: 1.2 10*3/uL (ref 0.7–4.0)
MCH: 31.1 pg (ref 26.0–34.0)
MCHC: 34.6 g/dL (ref 30.0–36.0)
MCV: 89.9 fL (ref 78.0–100.0)
MONOS PCT: 13 % — AB (ref 3–12)
Monocytes Absolute: 1.3 10*3/uL — ABNORMAL HIGH (ref 0.1–1.0)
NEUTROS ABS: 7.6 10*3/uL (ref 1.7–7.7)
NEUTROS PCT: 73 % (ref 43–77)
Platelets: 258 10*3/uL (ref 150–400)
RBC: 4.37 MIL/uL (ref 4.22–5.81)
RDW: 12.4 % (ref 11.5–15.5)
WBC: 10.3 10*3/uL (ref 4.0–10.5)

## 2014-04-15 LAB — URINALYSIS, ROUTINE W REFLEX MICROSCOPIC
Bilirubin Urine: NEGATIVE
Glucose, UA: >1000 mg/dL — AB
HGB URINE DIPSTICK: NEGATIVE
Ketones, ur: NEGATIVE mg/dL
Leukocytes, UA: NEGATIVE
NITRITE: NEGATIVE
PH: 6.5 (ref 5.0–8.0)
Protein, ur: NEGATIVE mg/dL
SPECIFIC GRAVITY, URINE: 1.01 (ref 1.005–1.030)
Urobilinogen, UA: 0.2 mg/dL (ref 0.0–1.0)

## 2014-04-15 LAB — I-STAT CHEM 8, ED
BUN: 18 mg/dL (ref 6–23)
BUN: 18 mg/dL (ref 6–23)
CALCIUM ION: 1.15 mmol/L (ref 1.12–1.23)
CHLORIDE: 98 meq/L (ref 96–112)
Calcium, Ion: 1.17 mmol/L (ref 1.12–1.23)
Chloride: 100 mEq/L (ref 96–112)
Creatinine, Ser: 1.1 mg/dL (ref 0.50–1.35)
Creatinine, Ser: 1.2 mg/dL (ref 0.50–1.35)
Glucose, Bld: 423 mg/dL — ABNORMAL HIGH (ref 70–99)
Glucose, Bld: 313 mg/dL — ABNORMAL HIGH (ref 70–99)
HCT: 41 % (ref 39.0–52.0)
HEMATOCRIT: 38 % — AB (ref 39.0–52.0)
Hemoglobin: 13.9 g/dL (ref 13.0–17.0)
Hemoglobin: 12.9 g/dL — ABNORMAL LOW (ref 13.0–17.0)
POTASSIUM: 2.9 meq/L — AB (ref 3.7–5.3)
Potassium: 3.3 mEq/L — ABNORMAL LOW (ref 3.7–5.3)
SODIUM: 135 meq/L — AB (ref 137–147)
Sodium: 139 mEq/L (ref 137–147)
TCO2: 29 mmol/L (ref 0–100)
TCO2: 26 mmol/L (ref 0–100)

## 2014-04-15 LAB — URINE MICROSCOPIC-ADD ON

## 2014-04-15 LAB — CBG MONITORING, ED
GLUCOSE-CAPILLARY: 408 mg/dL — AB (ref 70–99)
GLUCOSE-CAPILLARY: 289 mg/dL — AB (ref 70–99)

## 2014-04-15 MED ORDER — POTASSIUM CHLORIDE CRYS ER 20 MEQ PO TBCR
40.0000 meq | EXTENDED_RELEASE_TABLET | Freq: Once | ORAL | Status: DC
  Filled 2014-04-15: qty 2

## 2014-04-15 MED ORDER — SODIUM CHLORIDE 0.9 % IV SOLN
INTRAVENOUS | Status: DC
  Administered 2014-04-15: 1000 mL via INTRAVENOUS

## 2014-04-15 MED ORDER — POTASSIUM CHLORIDE 20 MEQ/15ML (10%) PO SOLN
40.0000 meq | Freq: Once | ORAL | Status: AC
  Administered 2014-04-15: 40 meq via ORAL
  Filled 2014-04-15: qty 30

## 2014-04-15 MED ORDER — INSULIN ASPART 100 UNIT/ML ~~LOC~~ SOLN
5.0000 [IU] | Freq: Once | SUBCUTANEOUS | Status: AC
  Administered 2014-04-15: 5 [IU] via INTRAVENOUS
  Filled 2014-04-15: qty 1

## 2014-04-15 MED ORDER — INSULIN ASPART 100 UNIT/ML ~~LOC~~ SOLN
2.0000 [IU] | Freq: Once | SUBCUTANEOUS | Status: AC
  Administered 2014-04-15: 2 [IU] via SUBCUTANEOUS
  Filled 2014-04-15: qty 1

## 2014-04-15 MED ORDER — SODIUM CHLORIDE 0.9 % IV BOLUS (SEPSIS)
1000.0000 mL | Freq: Once | INTRAVENOUS | Status: AC
  Administered 2014-04-15: 1000 mL via INTRAVENOUS

## 2014-04-15 NOTE — ED Notes (Signed)
Pt from [facility], glucose 512 at 1700, and 480 at 1800 was given 5 units of insulin and called EMS per their protocol. Pt's mother also lives at group home brought to EMS at the same time. Pt is mentally handicapped.

## 2014-04-15 NOTE — ED Notes (Signed)
discussed discharge instructions w/ POA.

## 2014-04-15 NOTE — ED Provider Notes (Signed)
CSN: [id number]     Arrival date & time 04/15/14  1846 History   First MD Initiated Contact with Patient 04/15/14 1924     Chief Complaint  Patient presents with  . Hyperglycemia   Level V caveat for mental retardation  (Consider location/radiation/quality/duration/timing/severity/associated sxs/prior Treatment) HPI  Patient is living in a assisted living facility. His mother is also a client there. Patient was sent to the ED today for hyperglycemia. Patient states he feels fine. He states he has no pain. Patient was seen in the ED on November 11 for hyperglycemia, and last night. Patient cannot tell me what he had to eat today. His sister who is his POA states he is on a regular diet. He has lived in this facility for 3 years. She denies that he has access to junk food and does not know how that got in his record when he was seen on the 11th.   PCP Dr Sherwood GamblerFusco  Past Medical History  Diagnosis Date  . MR (mental retardation)   . Hypertension   . UTI (urinary tract infection)   . Diabetes mellitus   . DKA (diabetic ketoacidoses)   . Gram-positive bacteremia   . Hypernatremia   . Sepsis(995.91)   . Renal disorder   . Hypokalemia    History reviewed. No pertinent past surgical history. History reviewed. No pertinent family history. History  Substance Use Topics  . Smoking status: Never Smoker   . Smokeless tobacco: Never Used  . Alcohol Use: No  lives in ALF  Review of Systems  Unable to perform ROS: Other      Allergies  Review of patient's allergies indicates no known allergies.  Home Medications   Prior to Admission medications   Medication Sig Start Date End Date Taking? Authorizing Provider  Cholecalciferol 1000 UNITS capsule Take 2,000 Units by mouth daily.    Yes Historical Provider, MD  fexofenadine (ALLEGRA) 180 MG tablet Take 180 mg by mouth daily.     Yes Historical Provider, MD  furosemide (LASIX) 20 MG tablet Take 20 mg by mouth 3 (three) times daily.    Yes Historical Provider, MD  insulin aspart (NOVOLOG) 100 UNIT/ML injection Inject 5 Units into the skin 3 (three) times daily with meals. 03/06/14  Yes Maryann Mikhail, DO  insulin glargine (LANTUS) 100 UNIT/ML injection Inject 0.35 mLs (35 Units total) into the skin at bedtime. 03/06/14  Yes Maryann Mikhail, DO  ketoconazole (NIZORAL) 2 % shampoo Apply 1 application topically 3 (three) times a week.    Yes Historical Provider, MD  losartan-hydrochlorothiazide (HYZAAR) 100-25 MG per tablet Take 1 tablet by mouth daily.   Yes Historical Provider, MD  Potassium Chloride 40 MEQ/15ML (20%) SOLN Take 7.5 mLs by mouth daily.   Yes Historical Provider, MD  potassium chloride SA (K-DUR,KLOR-CON) 20 MEQ tablet Take 20 mEq by mouth daily.    Historical Provider, MD   BP 118/82 mmHg  Pulse 105  Resp 18  Wt 169 lb (76.658 kg)  SpO2 94%  Vital signs normal except tachycardia  Physical Exam  Constitutional: He is oriented to person, place, and time. He appears well-developed and well-nourished.  Non-toxic appearance. He does not appear ill. No distress.  HENT:  Head: Normocephalic and atraumatic.  Right Ear: External ear normal.  Left Ear: External ear normal.  Nose: Nose normal. No mucosal edema or rhinorrhea.  Mouth/Throat: Mucous membranes are normal. No dental abscesses or uvula swelling.  Dry tongue  Eyes: Conjunctivae and EOM are  normal. Pupils are equal, round, and reactive to light.  Neck: Normal range of motion and full passive range of motion without pain. Neck supple.  Cardiovascular: Normal rate, regular rhythm and normal heart sounds.  Exam reveals no gallop and no friction rub.   No murmur heard. Pulmonary/Chest: Effort normal and breath sounds normal. No respiratory distress. He has no wheezes. He has no rhonchi. He has no rales. He exhibits no tenderness and no crepitus.  Abdominal: Soft. Normal appearance and bowel sounds are normal. He exhibits no distension. There is no tenderness.  There is no rebound and no guarding.  Musculoskeletal: Normal range of motion. He exhibits no edema or tenderness.  Moves all extremities well.   Neurological: He is alert and oriented to person, place, and time. He has normal strength. No cranial nerve deficit.  Skin: Skin is warm, dry and intact. No rash noted. No erythema. No pallor.  Psychiatric: His speech is normal and behavior is normal. His mood appears not anxious.  Appears mentally slow, gets easily agitated and on the verge of being aggressive  Nursing note and vitals reviewed.   ED Course  Procedures (including critical care time)  Medications  0.9 %  sodium chloride infusion (1,000 mLs Intravenous Not Given 04/15/14 2055)  sodium chloride 0.9 % bolus 1,000 mL (not administered)  insulin aspart (novoLOG) injection 2 Units (not administered)  insulin aspart (novoLOG) injection 5 Units (5 Units Intravenous Given 04/15/14 2052)  sodium chloride 0.9 % bolus 1,000 mL (1,000 mLs Intravenous New Bag/Given 04/15/14 2056)  potassium chloride 20 MEQ/15ML (10%) solution 40 mEq (40 mEq Oral Given 04/15/14 2102)    Patient sister is his POA. Patient's mother is also in the ED. His sister had me speak to the nurse at his assisted living facility. She states the patient is on a regular diet. However she states his sister, the POA, brings snacks every day. She states they have a cabinet full of cakes, potato chips, cheese and peanut butter crackers, potted meat that she brings for both this patient and his mother to eat. Patient's POA gets upset and denies that happening.   Patient given IV fluids and IV insulin. His hyperglycemia improved.  Labs Review Results for orders placed or performed during the hospital encounter of 04/15/14  CBC with Differential  Result Value Ref Range   WBC 10.3 4.0 - 10.5 K/uL   RBC 4.37 4.22 - 5.81 MIL/uL   Hemoglobin 13.6 13.0 - 17.0 g/dL   HCT 21.339.3 08.639.0 - 57.852.0 %   MCV 89.9 78.0 - 100.0 fL   MCH 31.1  26.0 - 34.0 pg   MCHC 34.6 30.0 - 36.0 g/dL   RDW 46.912.4 62.911.5 - 52.815.5 %   Platelets 258 150 - 400 K/uL   Neutrophils Relative % 73 43 - 77 %   Neutro Abs 7.6 1.7 - 7.7 K/uL   Lymphocytes Relative 12 12 - 46 %   Lymphs Abs 1.2 0.7 - 4.0 K/uL   Monocytes Relative 13 (H) 3 - 12 %   Monocytes Absolute 1.3 (H) 0.1 - 1.0 K/uL   Eosinophils Relative 1 0 - 5 %   Eosinophils Absolute 0.1 0.0 - 0.7 K/uL   Basophils Relative 1 0 - 1 %   Basophils Absolute 0.1 0.0 - 0.1 K/uL  Urinalysis, Routine w reflex microscopic  Result Value Ref Range   Color, Urine YELLOW YELLOW   APPearance CLEAR CLEAR   Specific Gravity, Urine 1.010 1.005 - 1.030  pH 6.5 5.0 - 8.0   Glucose, UA >1000 (A) NEGATIVE mg/dL   Hgb urine dipstick NEGATIVE NEGATIVE   Bilirubin Urine NEGATIVE NEGATIVE   Ketones, ur NEGATIVE NEGATIVE mg/dL   Protein, ur NEGATIVE NEGATIVE mg/dL   Urobilinogen, UA 0.2 0.0 - 1.0 mg/dL   Nitrite NEGATIVE NEGATIVE   Leukocytes, UA NEGATIVE NEGATIVE  Urine microscopic-add on  Result Value Ref Range   WBC, UA 0-2 <3 WBC/hpf   RBC / HPF 0-2 <3 RBC/hpf  CBG monitoring, ED  Result Value Ref Range   Glucose-Capillary 408 (H) 70 - 99 mg/dL  I-stat Chem 8, ED  Result Value Ref Range   Sodium 135 (L) 137 - 147 mEq/L   Potassium 2.9 (LL) 3.7 - 5.3 mEq/L   Chloride 98 96 - 112 mEq/L   BUN 18 6 - 23 mg/dL   Creatinine, Ser 0.98 0.50 - 1.35 mg/dL   Glucose, Bld 119 (H) 70 - 99 mg/dL   Calcium, Ion 1.47 8.29 - 1.23 mmol/L   TCO2 29 0 - 100 mmol/L   Hemoglobin 13.9 13.0 - 17.0 g/dL   HCT 56.2 13.0 - 86.5 %   Comment NOTIFIED PHYSICIAN   I-stat Chem 8, ED  Result Value Ref Range   Sodium 139 137 - 147 mEq/L   Potassium 3.3 (L) 3.7 - 5.3 mEq/L   Chloride 100 96 - 112 mEq/L   BUN 18 6 - 23 mg/dL   Creatinine, Ser 7.84 0.50 - 1.35 mg/dL   Glucose, Bld 696 (H) 70 - 99 mg/dL   Calcium, Ion 2.95 2.84 - 1.23 mmol/L   TCO2 26 0 - 100 mmol/L   Hemoglobin 12.9 (L) 13.0 - 17.0 g/dL   HCT 13.2 (L) 44.0 -  52.0 %  CBG monitoring, ED  Result Value Ref Range   Glucose-Capillary 289 (H) 70 - 99 mg/dL   Comment 1 Documented in Chart    Laboratory interpretation all normal except glucosuria, hypoglycemia, hypokalemia, without metabolic acidosis (AG 8), repeat istat after 1 liter of IV fluids and IV insulin shows improved hyperglycemia and improving AG of 13 and his hypokalemia is improving.      Imaging Review No results found.   EKG Interpretation None      MDM   Final diagnoses:  Hyperglycemia    Plan discharge   Devoria Albe, MD, Franz Dell, MD 04/15/14 2208

## 2014-04-15 NOTE — ED Notes (Signed)
Pt alert & oriented x4. Caregiver given discharge instructions, paperwork & prescription(s). Caregiver verbalized understanding. Pt left department in wheelchair w/ no further questions.

## 2014-04-15 NOTE — ED Notes (Signed)
CRITICAL VALUE ALERT  Critical value received:  K 2.9  Date of notification:  04/15/2014  Time of notification:  2005  Critical value read back: yes  Nurse who received alert:  Tiana LoftJanette Johnson, RN  MD notified:  Devoria AlbeIva Knapp, MD  Time:  2005

## 2014-04-15 NOTE — Discharge Instructions (Signed)
No more cake, cookies, cheese and peanut butter crackers, potato chips!!! You need to eat a healthy diet. Talk to his doctor about about changing his insulin regimen since he is having persistently been high for the past week.     Hyperglycemia Hyperglycemia occurs when the glucose (sugar) in your blood is too high. Hyperglycemia can happen for many reasons, but it most often happens to people who do not know they have diabetes or are not managing their diabetes properly.  CAUSES  Whether you have diabetes or not, there are other causes of hyperglycemia. Hyperglycemia can occur when you have diabetes, but it can also occur in other situations that you might not be as aware of, such as: Diabetes  If you have diabetes and are having problems controlling your blood glucose, hyperglycemia could occur because of some of the following reasons:  Not following your meal plan.  Not taking your diabetes medications or not taking it properly.  Exercising less or doing less activity than you normally do.  Being sick. Pre-diabetes  This cannot be ignored. Before people develop Type 2 diabetes, they almost always have "pre-diabetes." This is when your blood glucose levels are higher than normal, but not yet high enough to be diagnosed as diabetes. Research has shown that some long-term damage to the body, especially the heart and circulatory system, may already be occurring during pre-diabetes. If you take action to manage your blood glucose when you have pre-diabetes, you may delay or prevent Type 2 diabetes from developing. Stress  If you have diabetes, you may be "diet" controlled or on oral medications or insulin to control your diabetes. However, you may find that your blood glucose is higher than usual in the hospital whether you have diabetes or not. This is often referred to as "stress hyperglycemia." Stress can elevate your blood glucose. This happens because of hormones put out by the body during  times of stress. If stress has been the cause of your high blood glucose, it can be followed regularly by your caregiver. That way he/she can make sure your hyperglycemia does not continue to get worse or progress to diabetes. Steroids  Steroids are medications that act on the infection fighting system (immune system) to block inflammation or infection. One side effect can be a rise in blood glucose. Most people can produce enough extra insulin to allow for this rise, but for those who cannot, steroids make blood glucose levels go even higher. It is not unusual for steroid treatments to "uncover" diabetes that is developing. It is not always possible to determine if the hyperglycemia will go away after the steroids are stopped. A special blood test called an A1c is sometimes done to determine if your blood glucose was elevated before the steroids were started. SYMPTOMS  Thirsty.  Frequent urination.  Dry mouth.  Blurred vision.  Tired or fatigue.  Weakness.  Sleepy.  Tingling in feet or leg. DIAGNOSIS  Diagnosis is made by monitoring blood glucose in one or all of the following ways:  A1c test. This is a chemical found in your blood.  Fingerstick blood glucose monitoring.  Laboratory results. TREATMENT  First, knowing the cause of the hyperglycemia is important before the hyperglycemia can be treated. Treatment may include, but is not be limited to:  Education.  Change or adjustment in medications.  Change or adjustment in meal plan.  Treatment for an illness, infection, etc.  More frequent blood glucose monitoring.  Change in exercise plan.  Decreasing  or stopping steroids.  Lifestyle changes. HOME CARE INSTRUCTIONS   Test your blood glucose as directed.  Exercise regularly. Your caregiver will give you instructions about exercise. Pre-diabetes or diabetes which comes on with stress is helped by exercising.  Eat wholesome, balanced meals. Eat often and at  regular, fixed times. Your caregiver or nutritionist will give you a meal plan to guide your sugar intake.  Being at an ideal weight is important. If needed, losing as little as 10 to 15 pounds may help improve blood glucose levels. SEEK MEDICAL CARE IF:   You have questions about medicine, activity, or diet.  You continue to have symptoms (problems such as increased thirst, urination, or weight gain). SEEK IMMEDIATE MEDICAL CARE IF:   You are vomiting or have diarrhea.  Your breath smells fruity.  You are breathing faster or slower.  You are very sleepy or incoherent.  You have numbness, tingling, or pain in your feet or hands.  You have chest pain.  Your symptoms get worse even though you have been following your caregiver's orders.  If you have any other questions or concerns. Document Released: 11/10/2000 Document Revised: 08/09/2011 Document Reviewed: 09/13/2011 [facility] Patient Information 2015 [employer] [place]. This information is not intended to replace advice given to you by your health care provider. Make sure you discuss any questions you have with your health care provider.

## 2014-04-19 DIAGNOSIS — Z6827 Body mass index (BMI) 27.0-27.9, adult: Secondary | ICD-10-CM | POA: Diagnosis not present

## 2014-04-19 DIAGNOSIS — F79 Unspecified intellectual disabilities: Secondary | ICD-10-CM | POA: Diagnosis not present

## 2014-04-19 DIAGNOSIS — E109 Type 1 diabetes mellitus without complications: Secondary | ICD-10-CM | POA: Diagnosis not present

## 2014-05-21 DIAGNOSIS — F79 Unspecified intellectual disabilities: Secondary | ICD-10-CM | POA: Diagnosis not present

## 2014-05-21 DIAGNOSIS — E1122 Type 2 diabetes mellitus with diabetic chronic kidney disease: Secondary | ICD-10-CM | POA: Diagnosis not present

## 2014-05-21 DIAGNOSIS — M1990 Unspecified osteoarthritis, unspecified site: Secondary | ICD-10-CM | POA: Diagnosis not present

## 2014-05-21 DIAGNOSIS — I10 Essential (primary) hypertension: Secondary | ICD-10-CM | POA: Diagnosis not present

## 2014-05-22 DIAGNOSIS — E119 Type 2 diabetes mellitus without complications: Secondary | ICD-10-CM | POA: Diagnosis not present

## 2014-06-11 DIAGNOSIS — E1142 Type 2 diabetes mellitus with diabetic polyneuropathy: Secondary | ICD-10-CM | POA: Diagnosis not present

## 2014-06-11 DIAGNOSIS — L609 Nail disorder, unspecified: Secondary | ICD-10-CM | POA: Diagnosis not present

## 2014-06-28 DIAGNOSIS — E1121 Type 2 diabetes mellitus with diabetic nephropathy: Secondary | ICD-10-CM | POA: Diagnosis not present

## 2014-06-28 DIAGNOSIS — M1990 Unspecified osteoarthritis, unspecified site: Secondary | ICD-10-CM | POA: Diagnosis not present

## 2014-06-28 DIAGNOSIS — I10 Essential (primary) hypertension: Secondary | ICD-10-CM | POA: Diagnosis not present

## 2014-06-28 DIAGNOSIS — F79 Unspecified intellectual disabilities: Secondary | ICD-10-CM | POA: Diagnosis not present

## 2014-06-30 DIAGNOSIS — M1990 Unspecified osteoarthritis, unspecified site: Secondary | ICD-10-CM | POA: Diagnosis not present

## 2014-06-30 DIAGNOSIS — F79 Unspecified intellectual disabilities: Secondary | ICD-10-CM | POA: Diagnosis not present

## 2014-06-30 DIAGNOSIS — I10 Essential (primary) hypertension: Secondary | ICD-10-CM | POA: Diagnosis not present

## 2014-06-30 DIAGNOSIS — E1121 Type 2 diabetes mellitus with diabetic nephropathy: Secondary | ICD-10-CM | POA: Diagnosis not present

## 2014-07-01 DIAGNOSIS — M1990 Unspecified osteoarthritis, unspecified site: Secondary | ICD-10-CM | POA: Diagnosis not present

## 2014-07-01 DIAGNOSIS — F79 Unspecified intellectual disabilities: Secondary | ICD-10-CM | POA: Diagnosis not present

## 2014-07-01 DIAGNOSIS — I10 Essential (primary) hypertension: Secondary | ICD-10-CM | POA: Diagnosis not present

## 2014-07-02 DIAGNOSIS — F79 Unspecified intellectual disabilities: Secondary | ICD-10-CM | POA: Diagnosis not present

## 2014-07-02 DIAGNOSIS — M1990 Unspecified osteoarthritis, unspecified site: Secondary | ICD-10-CM | POA: Diagnosis not present

## 2014-07-02 DIAGNOSIS — I10 Essential (primary) hypertension: Secondary | ICD-10-CM | POA: Diagnosis not present

## 2014-07-03 DIAGNOSIS — F79 Unspecified intellectual disabilities: Secondary | ICD-10-CM | POA: Diagnosis not present

## 2014-07-03 DIAGNOSIS — M1990 Unspecified osteoarthritis, unspecified site: Secondary | ICD-10-CM | POA: Diagnosis not present

## 2014-07-03 DIAGNOSIS — I10 Essential (primary) hypertension: Secondary | ICD-10-CM | POA: Diagnosis not present

## 2014-07-04 DIAGNOSIS — F79 Unspecified intellectual disabilities: Secondary | ICD-10-CM | POA: Diagnosis not present

## 2014-07-04 DIAGNOSIS — I10 Essential (primary) hypertension: Secondary | ICD-10-CM | POA: Diagnosis not present

## 2014-07-04 DIAGNOSIS — M1990 Unspecified osteoarthritis, unspecified site: Secondary | ICD-10-CM | POA: Diagnosis not present

## 2014-07-05 DIAGNOSIS — F79 Unspecified intellectual disabilities: Secondary | ICD-10-CM | POA: Diagnosis not present

## 2014-07-05 DIAGNOSIS — M1990 Unspecified osteoarthritis, unspecified site: Secondary | ICD-10-CM | POA: Diagnosis not present

## 2014-07-05 DIAGNOSIS — I10 Essential (primary) hypertension: Secondary | ICD-10-CM | POA: Diagnosis not present

## 2014-07-06 DIAGNOSIS — I10 Essential (primary) hypertension: Secondary | ICD-10-CM | POA: Diagnosis not present

## 2014-07-06 DIAGNOSIS — M1990 Unspecified osteoarthritis, unspecified site: Secondary | ICD-10-CM | POA: Diagnosis not present

## 2014-07-06 DIAGNOSIS — F79 Unspecified intellectual disabilities: Secondary | ICD-10-CM | POA: Diagnosis not present

## 2014-07-08 DIAGNOSIS — F79 Unspecified intellectual disabilities: Secondary | ICD-10-CM | POA: Diagnosis not present

## 2014-07-08 DIAGNOSIS — I10 Essential (primary) hypertension: Secondary | ICD-10-CM | POA: Diagnosis not present

## 2014-07-08 DIAGNOSIS — M1990 Unspecified osteoarthritis, unspecified site: Secondary | ICD-10-CM | POA: Diagnosis not present

## 2014-07-09 DIAGNOSIS — F79 Unspecified intellectual disabilities: Secondary | ICD-10-CM | POA: Diagnosis not present

## 2014-07-09 DIAGNOSIS — I10 Essential (primary) hypertension: Secondary | ICD-10-CM | POA: Diagnosis not present

## 2014-07-09 DIAGNOSIS — M1990 Unspecified osteoarthritis, unspecified site: Secondary | ICD-10-CM | POA: Diagnosis not present

## 2014-07-10 DIAGNOSIS — I10 Essential (primary) hypertension: Secondary | ICD-10-CM | POA: Diagnosis not present

## 2014-07-10 DIAGNOSIS — F79 Unspecified intellectual disabilities: Secondary | ICD-10-CM | POA: Diagnosis not present

## 2014-07-10 DIAGNOSIS — M1990 Unspecified osteoarthritis, unspecified site: Secondary | ICD-10-CM | POA: Diagnosis not present

## 2014-07-11 DIAGNOSIS — M1990 Unspecified osteoarthritis, unspecified site: Secondary | ICD-10-CM | POA: Diagnosis not present

## 2014-07-11 DIAGNOSIS — F79 Unspecified intellectual disabilities: Secondary | ICD-10-CM | POA: Diagnosis not present

## 2014-07-11 DIAGNOSIS — I10 Essential (primary) hypertension: Secondary | ICD-10-CM | POA: Diagnosis not present

## 2014-07-12 DIAGNOSIS — F79 Unspecified intellectual disabilities: Secondary | ICD-10-CM | POA: Diagnosis not present

## 2014-07-12 DIAGNOSIS — I10 Essential (primary) hypertension: Secondary | ICD-10-CM | POA: Diagnosis not present

## 2014-07-12 DIAGNOSIS — M1990 Unspecified osteoarthritis, unspecified site: Secondary | ICD-10-CM | POA: Diagnosis not present

## 2014-07-14 DIAGNOSIS — I10 Essential (primary) hypertension: Secondary | ICD-10-CM | POA: Diagnosis not present

## 2014-07-14 DIAGNOSIS — F79 Unspecified intellectual disabilities: Secondary | ICD-10-CM | POA: Diagnosis not present

## 2014-07-14 DIAGNOSIS — M1990 Unspecified osteoarthritis, unspecified site: Secondary | ICD-10-CM | POA: Diagnosis not present

## 2014-07-15 DIAGNOSIS — M1990 Unspecified osteoarthritis, unspecified site: Secondary | ICD-10-CM | POA: Diagnosis not present

## 2014-07-15 DIAGNOSIS — F79 Unspecified intellectual disabilities: Secondary | ICD-10-CM | POA: Diagnosis not present

## 2014-07-15 DIAGNOSIS — I10 Essential (primary) hypertension: Secondary | ICD-10-CM | POA: Diagnosis not present

## 2014-07-16 DIAGNOSIS — M1990 Unspecified osteoarthritis, unspecified site: Secondary | ICD-10-CM | POA: Diagnosis not present

## 2014-07-16 DIAGNOSIS — I10 Essential (primary) hypertension: Secondary | ICD-10-CM | POA: Diagnosis not present

## 2014-07-16 DIAGNOSIS — F79 Unspecified intellectual disabilities: Secondary | ICD-10-CM | POA: Diagnosis not present

## 2014-07-17 DIAGNOSIS — F79 Unspecified intellectual disabilities: Secondary | ICD-10-CM | POA: Diagnosis not present

## 2014-07-17 DIAGNOSIS — M1990 Unspecified osteoarthritis, unspecified site: Secondary | ICD-10-CM | POA: Diagnosis not present

## 2014-07-17 DIAGNOSIS — I10 Essential (primary) hypertension: Secondary | ICD-10-CM | POA: Diagnosis not present

## 2014-07-18 DIAGNOSIS — F79 Unspecified intellectual disabilities: Secondary | ICD-10-CM | POA: Diagnosis not present

## 2014-07-18 DIAGNOSIS — M1990 Unspecified osteoarthritis, unspecified site: Secondary | ICD-10-CM | POA: Diagnosis not present

## 2014-07-18 DIAGNOSIS — I10 Essential (primary) hypertension: Secondary | ICD-10-CM | POA: Diagnosis not present

## 2014-07-19 DIAGNOSIS — M1990 Unspecified osteoarthritis, unspecified site: Secondary | ICD-10-CM | POA: Diagnosis not present

## 2014-07-19 DIAGNOSIS — I10 Essential (primary) hypertension: Secondary | ICD-10-CM | POA: Diagnosis not present

## 2014-07-19 DIAGNOSIS — F79 Unspecified intellectual disabilities: Secondary | ICD-10-CM | POA: Diagnosis not present

## 2014-07-22 DIAGNOSIS — F79 Unspecified intellectual disabilities: Secondary | ICD-10-CM | POA: Diagnosis not present

## 2014-07-22 DIAGNOSIS — M1990 Unspecified osteoarthritis, unspecified site: Secondary | ICD-10-CM | POA: Diagnosis not present

## 2014-07-22 DIAGNOSIS — I10 Essential (primary) hypertension: Secondary | ICD-10-CM | POA: Diagnosis not present

## 2014-07-23 DIAGNOSIS — I10 Essential (primary) hypertension: Secondary | ICD-10-CM | POA: Diagnosis not present

## 2014-07-23 DIAGNOSIS — E119 Type 2 diabetes mellitus without complications: Secondary | ICD-10-CM | POA: Diagnosis not present

## 2014-07-23 DIAGNOSIS — M1990 Unspecified osteoarthritis, unspecified site: Secondary | ICD-10-CM | POA: Diagnosis not present

## 2014-07-23 DIAGNOSIS — F79 Unspecified intellectual disabilities: Secondary | ICD-10-CM | POA: Diagnosis not present

## 2014-07-24 DIAGNOSIS — F79 Unspecified intellectual disabilities: Secondary | ICD-10-CM | POA: Diagnosis not present

## 2014-07-24 DIAGNOSIS — M1990 Unspecified osteoarthritis, unspecified site: Secondary | ICD-10-CM | POA: Diagnosis not present

## 2014-07-24 DIAGNOSIS — I10 Essential (primary) hypertension: Secondary | ICD-10-CM | POA: Diagnosis not present

## 2014-07-25 DIAGNOSIS — M1990 Unspecified osteoarthritis, unspecified site: Secondary | ICD-10-CM | POA: Diagnosis not present

## 2014-07-25 DIAGNOSIS — F79 Unspecified intellectual disabilities: Secondary | ICD-10-CM | POA: Diagnosis not present

## 2014-07-25 DIAGNOSIS — I10 Essential (primary) hypertension: Secondary | ICD-10-CM | POA: Diagnosis not present

## 2014-07-26 DIAGNOSIS — I10 Essential (primary) hypertension: Secondary | ICD-10-CM | POA: Diagnosis not present

## 2014-07-26 DIAGNOSIS — M1990 Unspecified osteoarthritis, unspecified site: Secondary | ICD-10-CM | POA: Diagnosis not present

## 2014-07-26 DIAGNOSIS — F79 Unspecified intellectual disabilities: Secondary | ICD-10-CM | POA: Diagnosis not present

## 2014-07-27 DIAGNOSIS — F79 Unspecified intellectual disabilities: Secondary | ICD-10-CM | POA: Diagnosis not present

## 2014-07-27 DIAGNOSIS — I10 Essential (primary) hypertension: Secondary | ICD-10-CM | POA: Diagnosis not present

## 2014-07-27 DIAGNOSIS — M1990 Unspecified osteoarthritis, unspecified site: Secondary | ICD-10-CM | POA: Diagnosis not present

## 2014-07-28 DIAGNOSIS — M1990 Unspecified osteoarthritis, unspecified site: Secondary | ICD-10-CM | POA: Diagnosis not present

## 2014-07-28 DIAGNOSIS — I10 Essential (primary) hypertension: Secondary | ICD-10-CM | POA: Diagnosis not present

## 2014-07-28 DIAGNOSIS — F79 Unspecified intellectual disabilities: Secondary | ICD-10-CM | POA: Diagnosis not present

## 2014-07-29 DIAGNOSIS — I10 Essential (primary) hypertension: Secondary | ICD-10-CM | POA: Diagnosis not present

## 2014-07-29 DIAGNOSIS — M1990 Unspecified osteoarthritis, unspecified site: Secondary | ICD-10-CM | POA: Diagnosis not present

## 2014-07-29 DIAGNOSIS — F79 Unspecified intellectual disabilities: Secondary | ICD-10-CM | POA: Diagnosis not present

## 2014-07-30 DIAGNOSIS — I10 Essential (primary) hypertension: Secondary | ICD-10-CM | POA: Diagnosis not present

## 2014-07-30 DIAGNOSIS — R269 Unspecified abnormalities of gait and mobility: Secondary | ICD-10-CM | POA: Diagnosis not present

## 2014-07-31 DIAGNOSIS — I10 Essential (primary) hypertension: Secondary | ICD-10-CM | POA: Diagnosis not present

## 2014-07-31 DIAGNOSIS — R269 Unspecified abnormalities of gait and mobility: Secondary | ICD-10-CM | POA: Diagnosis not present

## 2014-08-01 DIAGNOSIS — I10 Essential (primary) hypertension: Secondary | ICD-10-CM | POA: Diagnosis not present

## 2014-08-01 DIAGNOSIS — R269 Unspecified abnormalities of gait and mobility: Secondary | ICD-10-CM | POA: Diagnosis not present

## 2014-08-02 DIAGNOSIS — R269 Unspecified abnormalities of gait and mobility: Secondary | ICD-10-CM | POA: Diagnosis not present

## 2014-08-02 DIAGNOSIS — E119 Type 2 diabetes mellitus without complications: Secondary | ICD-10-CM | POA: Diagnosis not present

## 2014-08-02 DIAGNOSIS — I10 Essential (primary) hypertension: Secondary | ICD-10-CM | POA: Diagnosis not present

## 2014-08-03 DIAGNOSIS — I10 Essential (primary) hypertension: Secondary | ICD-10-CM | POA: Diagnosis not present

## 2014-08-03 DIAGNOSIS — R269 Unspecified abnormalities of gait and mobility: Secondary | ICD-10-CM | POA: Diagnosis not present

## 2014-08-05 DIAGNOSIS — I10 Essential (primary) hypertension: Secondary | ICD-10-CM | POA: Diagnosis not present

## 2014-08-05 DIAGNOSIS — R269 Unspecified abnormalities of gait and mobility: Secondary | ICD-10-CM | POA: Diagnosis not present

## 2014-08-06 DIAGNOSIS — R269 Unspecified abnormalities of gait and mobility: Secondary | ICD-10-CM | POA: Diagnosis not present

## 2014-08-06 DIAGNOSIS — I10 Essential (primary) hypertension: Secondary | ICD-10-CM | POA: Diagnosis not present

## 2014-08-07 DIAGNOSIS — I10 Essential (primary) hypertension: Secondary | ICD-10-CM | POA: Diagnosis not present

## 2014-08-07 DIAGNOSIS — R269 Unspecified abnormalities of gait and mobility: Secondary | ICD-10-CM | POA: Diagnosis not present

## 2014-08-08 DIAGNOSIS — I10 Essential (primary) hypertension: Secondary | ICD-10-CM | POA: Diagnosis not present

## 2014-08-08 DIAGNOSIS — R269 Unspecified abnormalities of gait and mobility: Secondary | ICD-10-CM | POA: Diagnosis not present

## 2014-08-09 DIAGNOSIS — R269 Unspecified abnormalities of gait and mobility: Secondary | ICD-10-CM | POA: Diagnosis not present

## 2014-08-09 DIAGNOSIS — I10 Essential (primary) hypertension: Secondary | ICD-10-CM | POA: Diagnosis not present

## 2014-08-11 DIAGNOSIS — R269 Unspecified abnormalities of gait and mobility: Secondary | ICD-10-CM | POA: Diagnosis not present

## 2014-08-11 DIAGNOSIS — I10 Essential (primary) hypertension: Secondary | ICD-10-CM | POA: Diagnosis not present

## 2014-08-12 DIAGNOSIS — R269 Unspecified abnormalities of gait and mobility: Secondary | ICD-10-CM | POA: Diagnosis not present

## 2014-08-12 DIAGNOSIS — I10 Essential (primary) hypertension: Secondary | ICD-10-CM | POA: Diagnosis not present

## 2014-08-13 DIAGNOSIS — I10 Essential (primary) hypertension: Secondary | ICD-10-CM | POA: Diagnosis not present

## 2014-08-13 DIAGNOSIS — R269 Unspecified abnormalities of gait and mobility: Secondary | ICD-10-CM | POA: Diagnosis not present

## 2014-08-14 DIAGNOSIS — R269 Unspecified abnormalities of gait and mobility: Secondary | ICD-10-CM | POA: Diagnosis not present

## 2014-08-14 DIAGNOSIS — I10 Essential (primary) hypertension: Secondary | ICD-10-CM | POA: Diagnosis not present

## 2014-08-15 DIAGNOSIS — R269 Unspecified abnormalities of gait and mobility: Secondary | ICD-10-CM | POA: Diagnosis not present

## 2014-08-15 DIAGNOSIS — I10 Essential (primary) hypertension: Secondary | ICD-10-CM | POA: Diagnosis not present

## 2014-08-16 DIAGNOSIS — I10 Essential (primary) hypertension: Secondary | ICD-10-CM | POA: Diagnosis not present

## 2014-08-16 DIAGNOSIS — R269 Unspecified abnormalities of gait and mobility: Secondary | ICD-10-CM | POA: Diagnosis not present

## 2014-08-19 DIAGNOSIS — I10 Essential (primary) hypertension: Secondary | ICD-10-CM | POA: Diagnosis not present

## 2014-08-19 DIAGNOSIS — R269 Unspecified abnormalities of gait and mobility: Secondary | ICD-10-CM | POA: Diagnosis not present

## 2014-08-20 DIAGNOSIS — R269 Unspecified abnormalities of gait and mobility: Secondary | ICD-10-CM | POA: Diagnosis not present

## 2014-08-20 DIAGNOSIS — E1142 Type 2 diabetes mellitus with diabetic polyneuropathy: Secondary | ICD-10-CM | POA: Diagnosis not present

## 2014-08-20 DIAGNOSIS — I10 Essential (primary) hypertension: Secondary | ICD-10-CM | POA: Diagnosis not present

## 2014-08-20 DIAGNOSIS — L609 Nail disorder, unspecified: Secondary | ICD-10-CM | POA: Diagnosis not present

## 2014-08-21 DIAGNOSIS — I10 Essential (primary) hypertension: Secondary | ICD-10-CM | POA: Diagnosis not present

## 2014-08-21 DIAGNOSIS — R269 Unspecified abnormalities of gait and mobility: Secondary | ICD-10-CM | POA: Diagnosis not present

## 2014-08-22 DIAGNOSIS — R269 Unspecified abnormalities of gait and mobility: Secondary | ICD-10-CM | POA: Diagnosis not present

## 2014-08-22 DIAGNOSIS — I10 Essential (primary) hypertension: Secondary | ICD-10-CM | POA: Diagnosis not present

## 2014-08-23 DIAGNOSIS — R269 Unspecified abnormalities of gait and mobility: Secondary | ICD-10-CM | POA: Diagnosis not present

## 2014-08-23 DIAGNOSIS — I10 Essential (primary) hypertension: Secondary | ICD-10-CM | POA: Diagnosis not present

## 2014-08-24 DIAGNOSIS — R269 Unspecified abnormalities of gait and mobility: Secondary | ICD-10-CM | POA: Diagnosis not present

## 2014-08-24 DIAGNOSIS — I10 Essential (primary) hypertension: Secondary | ICD-10-CM | POA: Diagnosis not present

## 2014-08-25 DIAGNOSIS — I10 Essential (primary) hypertension: Secondary | ICD-10-CM | POA: Diagnosis not present

## 2014-08-25 DIAGNOSIS — R269 Unspecified abnormalities of gait and mobility: Secondary | ICD-10-CM | POA: Diagnosis not present

## 2014-08-26 DIAGNOSIS — R269 Unspecified abnormalities of gait and mobility: Secondary | ICD-10-CM | POA: Diagnosis not present

## 2014-08-26 DIAGNOSIS — I10 Essential (primary) hypertension: Secondary | ICD-10-CM | POA: Diagnosis not present

## 2014-08-27 DIAGNOSIS — I10 Essential (primary) hypertension: Secondary | ICD-10-CM | POA: Diagnosis not present

## 2014-08-27 DIAGNOSIS — R269 Unspecified abnormalities of gait and mobility: Secondary | ICD-10-CM | POA: Diagnosis not present

## 2014-08-28 DIAGNOSIS — R269 Unspecified abnormalities of gait and mobility: Secondary | ICD-10-CM | POA: Diagnosis not present

## 2014-08-28 DIAGNOSIS — I10 Essential (primary) hypertension: Secondary | ICD-10-CM | POA: Diagnosis not present

## 2014-08-29 DIAGNOSIS — I10 Essential (primary) hypertension: Secondary | ICD-10-CM | POA: Diagnosis not present

## 2014-08-29 DIAGNOSIS — R269 Unspecified abnormalities of gait and mobility: Secondary | ICD-10-CM | POA: Diagnosis not present

## 2014-08-30 DIAGNOSIS — I10 Essential (primary) hypertension: Secondary | ICD-10-CM | POA: Diagnosis not present

## 2014-08-30 DIAGNOSIS — R269 Unspecified abnormalities of gait and mobility: Secondary | ICD-10-CM | POA: Diagnosis not present

## 2014-08-30 DIAGNOSIS — F79 Unspecified intellectual disabilities: Secondary | ICD-10-CM | POA: Diagnosis not present

## 2014-09-01 DIAGNOSIS — I10 Essential (primary) hypertension: Secondary | ICD-10-CM | POA: Diagnosis not present

## 2014-09-01 DIAGNOSIS — F79 Unspecified intellectual disabilities: Secondary | ICD-10-CM | POA: Diagnosis not present

## 2014-09-01 DIAGNOSIS — R269 Unspecified abnormalities of gait and mobility: Secondary | ICD-10-CM | POA: Diagnosis not present

## 2014-09-02 DIAGNOSIS — F79 Unspecified intellectual disabilities: Secondary | ICD-10-CM | POA: Diagnosis not present

## 2014-09-02 DIAGNOSIS — R269 Unspecified abnormalities of gait and mobility: Secondary | ICD-10-CM | POA: Diagnosis not present

## 2014-09-02 DIAGNOSIS — I10 Essential (primary) hypertension: Secondary | ICD-10-CM | POA: Diagnosis not present

## 2014-09-03 DIAGNOSIS — I10 Essential (primary) hypertension: Secondary | ICD-10-CM | POA: Diagnosis not present

## 2014-09-03 DIAGNOSIS — F79 Unspecified intellectual disabilities: Secondary | ICD-10-CM | POA: Diagnosis not present

## 2014-09-03 DIAGNOSIS — R269 Unspecified abnormalities of gait and mobility: Secondary | ICD-10-CM | POA: Diagnosis not present

## 2014-09-04 DIAGNOSIS — R269 Unspecified abnormalities of gait and mobility: Secondary | ICD-10-CM | POA: Diagnosis not present

## 2014-09-04 DIAGNOSIS — I10 Essential (primary) hypertension: Secondary | ICD-10-CM | POA: Diagnosis not present

## 2014-09-04 DIAGNOSIS — F79 Unspecified intellectual disabilities: Secondary | ICD-10-CM | POA: Diagnosis not present

## 2014-09-05 DIAGNOSIS — F79 Unspecified intellectual disabilities: Secondary | ICD-10-CM | POA: Diagnosis not present

## 2014-09-05 DIAGNOSIS — R269 Unspecified abnormalities of gait and mobility: Secondary | ICD-10-CM | POA: Diagnosis not present

## 2014-09-05 DIAGNOSIS — I10 Essential (primary) hypertension: Secondary | ICD-10-CM | POA: Diagnosis not present

## 2014-09-06 DIAGNOSIS — F79 Unspecified intellectual disabilities: Secondary | ICD-10-CM | POA: Diagnosis not present

## 2014-09-06 DIAGNOSIS — E119 Type 2 diabetes mellitus without complications: Secondary | ICD-10-CM | POA: Diagnosis not present

## 2014-09-06 DIAGNOSIS — M1990 Unspecified osteoarthritis, unspecified site: Secondary | ICD-10-CM | POA: Diagnosis not present

## 2014-09-09 DIAGNOSIS — F79 Unspecified intellectual disabilities: Secondary | ICD-10-CM | POA: Diagnosis not present

## 2014-09-09 DIAGNOSIS — R269 Unspecified abnormalities of gait and mobility: Secondary | ICD-10-CM | POA: Diagnosis not present

## 2014-09-09 DIAGNOSIS — I10 Essential (primary) hypertension: Secondary | ICD-10-CM | POA: Diagnosis not present

## 2014-09-10 DIAGNOSIS — R269 Unspecified abnormalities of gait and mobility: Secondary | ICD-10-CM | POA: Diagnosis not present

## 2014-09-10 DIAGNOSIS — I10 Essential (primary) hypertension: Secondary | ICD-10-CM | POA: Diagnosis not present

## 2014-09-10 DIAGNOSIS — F79 Unspecified intellectual disabilities: Secondary | ICD-10-CM | POA: Diagnosis not present

## 2014-09-11 DIAGNOSIS — R269 Unspecified abnormalities of gait and mobility: Secondary | ICD-10-CM | POA: Diagnosis not present

## 2014-09-11 DIAGNOSIS — F79 Unspecified intellectual disabilities: Secondary | ICD-10-CM | POA: Diagnosis not present

## 2014-09-11 DIAGNOSIS — I10 Essential (primary) hypertension: Secondary | ICD-10-CM | POA: Diagnosis not present

## 2014-09-12 DIAGNOSIS — I10 Essential (primary) hypertension: Secondary | ICD-10-CM | POA: Diagnosis not present

## 2014-09-12 DIAGNOSIS — F79 Unspecified intellectual disabilities: Secondary | ICD-10-CM | POA: Diagnosis not present

## 2014-09-12 DIAGNOSIS — R269 Unspecified abnormalities of gait and mobility: Secondary | ICD-10-CM | POA: Diagnosis not present

## 2014-09-13 DIAGNOSIS — I10 Essential (primary) hypertension: Secondary | ICD-10-CM | POA: Diagnosis not present

## 2014-09-13 DIAGNOSIS — R269 Unspecified abnormalities of gait and mobility: Secondary | ICD-10-CM | POA: Diagnosis not present

## 2014-09-13 DIAGNOSIS — F79 Unspecified intellectual disabilities: Secondary | ICD-10-CM | POA: Diagnosis not present

## 2014-09-16 DIAGNOSIS — I10 Essential (primary) hypertension: Secondary | ICD-10-CM | POA: Diagnosis not present

## 2014-09-16 DIAGNOSIS — F79 Unspecified intellectual disabilities: Secondary | ICD-10-CM | POA: Diagnosis not present

## 2014-09-16 DIAGNOSIS — R269 Unspecified abnormalities of gait and mobility: Secondary | ICD-10-CM | POA: Diagnosis not present

## 2014-09-17 DIAGNOSIS — R269 Unspecified abnormalities of gait and mobility: Secondary | ICD-10-CM | POA: Diagnosis not present

## 2014-09-17 DIAGNOSIS — F79 Unspecified intellectual disabilities: Secondary | ICD-10-CM | POA: Diagnosis not present

## 2014-09-17 DIAGNOSIS — I10 Essential (primary) hypertension: Secondary | ICD-10-CM | POA: Diagnosis not present

## 2014-09-18 DIAGNOSIS — R269 Unspecified abnormalities of gait and mobility: Secondary | ICD-10-CM | POA: Diagnosis not present

## 2014-09-18 DIAGNOSIS — F79 Unspecified intellectual disabilities: Secondary | ICD-10-CM | POA: Diagnosis not present

## 2014-09-18 DIAGNOSIS — I10 Essential (primary) hypertension: Secondary | ICD-10-CM | POA: Diagnosis not present

## 2014-09-19 DIAGNOSIS — F79 Unspecified intellectual disabilities: Secondary | ICD-10-CM | POA: Diagnosis not present

## 2014-09-19 DIAGNOSIS — I10 Essential (primary) hypertension: Secondary | ICD-10-CM | POA: Diagnosis not present

## 2014-09-19 DIAGNOSIS — R269 Unspecified abnormalities of gait and mobility: Secondary | ICD-10-CM | POA: Diagnosis not present

## 2014-09-20 DIAGNOSIS — F79 Unspecified intellectual disabilities: Secondary | ICD-10-CM | POA: Diagnosis not present

## 2014-09-20 DIAGNOSIS — R269 Unspecified abnormalities of gait and mobility: Secondary | ICD-10-CM | POA: Diagnosis not present

## 2014-09-20 DIAGNOSIS — I10 Essential (primary) hypertension: Secondary | ICD-10-CM | POA: Diagnosis not present

## 2014-09-23 DIAGNOSIS — H2513 Age-related nuclear cataract, bilateral: Secondary | ICD-10-CM | POA: Diagnosis not present

## 2014-09-23 DIAGNOSIS — E119 Type 2 diabetes mellitus without complications: Secondary | ICD-10-CM | POA: Diagnosis not present

## 2014-10-21 DIAGNOSIS — E119 Type 2 diabetes mellitus without complications: Secondary | ICD-10-CM | POA: Diagnosis not present

## 2014-10-21 DIAGNOSIS — F79 Unspecified intellectual disabilities: Secondary | ICD-10-CM | POA: Diagnosis not present

## 2014-10-21 DIAGNOSIS — M1990 Unspecified osteoarthritis, unspecified site: Secondary | ICD-10-CM | POA: Diagnosis not present

## 2014-10-27 DIAGNOSIS — R600 Localized edema: Secondary | ICD-10-CM | POA: Diagnosis not present

## 2014-11-01 DIAGNOSIS — I872 Venous insufficiency (chronic) (peripheral): Secondary | ICD-10-CM | POA: Diagnosis not present

## 2014-11-01 DIAGNOSIS — Z713 Dietary counseling and surveillance: Secondary | ICD-10-CM | POA: Diagnosis not present

## 2014-11-01 DIAGNOSIS — E119 Type 2 diabetes mellitus without complications: Secondary | ICD-10-CM | POA: Diagnosis not present

## 2014-11-01 DIAGNOSIS — E6609 Other obesity due to excess calories: Secondary | ICD-10-CM | POA: Diagnosis not present

## 2014-11-01 DIAGNOSIS — I10 Essential (primary) hypertension: Secondary | ICD-10-CM | POA: Diagnosis not present

## 2014-11-01 DIAGNOSIS — M1991 Primary osteoarthritis, unspecified site: Secondary | ICD-10-CM | POA: Diagnosis not present

## 2014-11-01 DIAGNOSIS — L03119 Cellulitis of unspecified part of limb: Secondary | ICD-10-CM | POA: Diagnosis not present

## 2014-11-01 DIAGNOSIS — Z6831 Body mass index (BMI) 31.0-31.9, adult: Secondary | ICD-10-CM | POA: Diagnosis not present

## 2014-11-05 DIAGNOSIS — L609 Nail disorder, unspecified: Secondary | ICD-10-CM | POA: Diagnosis not present

## 2014-11-05 DIAGNOSIS — E6609 Other obesity due to excess calories: Secondary | ICD-10-CM | POA: Diagnosis not present

## 2014-11-05 DIAGNOSIS — E1142 Type 2 diabetes mellitus with diabetic polyneuropathy: Secondary | ICD-10-CM | POA: Diagnosis not present

## 2014-11-05 DIAGNOSIS — E119 Type 2 diabetes mellitus without complications: Secondary | ICD-10-CM | POA: Diagnosis not present

## 2014-11-05 DIAGNOSIS — Z713 Dietary counseling and surveillance: Secondary | ICD-10-CM | POA: Diagnosis not present

## 2014-11-05 DIAGNOSIS — R5383 Other fatigue: Secondary | ICD-10-CM | POA: Diagnosis not present

## 2014-11-05 DIAGNOSIS — Z6831 Body mass index (BMI) 31.0-31.9, adult: Secondary | ICD-10-CM | POA: Diagnosis not present

## 2014-11-05 DIAGNOSIS — Z125 Encounter for screening for malignant neoplasm of prostate: Secondary | ICD-10-CM | POA: Diagnosis not present

## 2014-11-13 DIAGNOSIS — E119 Type 2 diabetes mellitus without complications: Secondary | ICD-10-CM | POA: Diagnosis not present

## 2014-12-09 DIAGNOSIS — E119 Type 2 diabetes mellitus without complications: Secondary | ICD-10-CM | POA: Diagnosis not present

## 2014-12-09 DIAGNOSIS — F79 Unspecified intellectual disabilities: Secondary | ICD-10-CM | POA: Diagnosis not present

## 2014-12-09 DIAGNOSIS — M1990 Unspecified osteoarthritis, unspecified site: Secondary | ICD-10-CM | POA: Diagnosis not present

## 2014-12-10 DIAGNOSIS — E1151 Type 2 diabetes mellitus with diabetic peripheral angiopathy without gangrene: Secondary | ICD-10-CM | POA: Diagnosis not present

## 2014-12-11 DIAGNOSIS — E039 Hypothyroidism, unspecified: Secondary | ICD-10-CM | POA: Diagnosis not present

## 2014-12-12 DIAGNOSIS — I10 Essential (primary) hypertension: Secondary | ICD-10-CM | POA: Diagnosis not present

## 2014-12-12 DIAGNOSIS — M1991 Primary osteoarthritis, unspecified site: Secondary | ICD-10-CM | POA: Diagnosis not present

## 2014-12-12 DIAGNOSIS — E039 Hypothyroidism, unspecified: Secondary | ICD-10-CM | POA: Diagnosis not present

## 2014-12-12 DIAGNOSIS — E6609 Other obesity due to excess calories: Secondary | ICD-10-CM | POA: Diagnosis not present

## 2014-12-12 DIAGNOSIS — Z6831 Body mass index (BMI) 31.0-31.9, adult: Secondary | ICD-10-CM | POA: Diagnosis not present

## 2015-01-17 DIAGNOSIS — L609 Nail disorder, unspecified: Secondary | ICD-10-CM | POA: Diagnosis not present

## 2015-01-17 DIAGNOSIS — E1142 Type 2 diabetes mellitus with diabetic polyneuropathy: Secondary | ICD-10-CM | POA: Diagnosis not present

## 2015-01-24 DIAGNOSIS — E119 Type 2 diabetes mellitus without complications: Secondary | ICD-10-CM | POA: Diagnosis not present

## 2015-01-24 DIAGNOSIS — F79 Unspecified intellectual disabilities: Secondary | ICD-10-CM | POA: Diagnosis not present

## 2015-01-24 DIAGNOSIS — M1990 Unspecified osteoarthritis, unspecified site: Secondary | ICD-10-CM | POA: Diagnosis not present

## 2015-02-19 DIAGNOSIS — E119 Type 2 diabetes mellitus without complications: Secondary | ICD-10-CM | POA: Diagnosis not present

## 2015-02-25 DIAGNOSIS — E039 Hypothyroidism, unspecified: Secondary | ICD-10-CM | POA: Diagnosis not present

## 2015-02-25 DIAGNOSIS — Z1389 Encounter for screening for other disorder: Secondary | ICD-10-CM | POA: Diagnosis not present

## 2015-02-25 DIAGNOSIS — Z6832 Body mass index (BMI) 32.0-32.9, adult: Secondary | ICD-10-CM | POA: Diagnosis not present

## 2015-02-25 DIAGNOSIS — M1991 Primary osteoarthritis, unspecified site: Secondary | ICD-10-CM | POA: Diagnosis not present

## 2015-02-25 DIAGNOSIS — I10 Essential (primary) hypertension: Secondary | ICD-10-CM | POA: Diagnosis not present

## 2015-02-25 DIAGNOSIS — E119 Type 2 diabetes mellitus without complications: Secondary | ICD-10-CM | POA: Diagnosis not present

## 2015-02-25 DIAGNOSIS — E063 Autoimmune thyroiditis: Secondary | ICD-10-CM | POA: Diagnosis not present

## 2015-03-04 ENCOUNTER — Other Ambulatory Visit: Payer: Self-pay | Admitting: Urology

## 2015-03-04 ENCOUNTER — Ambulatory Visit (INDEPENDENT_AMBULATORY_CARE_PROVIDER_SITE_OTHER): Payer: Medicare Other | Admitting: Urology

## 2015-03-04 ENCOUNTER — Ambulatory Visit (HOSPITAL_COMMUNITY)
Admission: RE | Admit: 2015-03-04 | Discharge: 2015-03-04 | Disposition: A | Discharge status: Home / Self Care | Payer: Medicare Other | Source: Ambulatory Visit | Attending: Urology | Admitting: Urology

## 2015-03-04 DIAGNOSIS — N200 Calculus of kidney: Secondary | ICD-10-CM

## 2015-03-04 DIAGNOSIS — N320 Bladder-neck obstruction: Secondary | ICD-10-CM

## 2015-03-04 DIAGNOSIS — M47896 Other spondylosis, lumbar region: Secondary | ICD-10-CM | POA: Diagnosis not present

## 2015-03-04 DIAGNOSIS — M8580 Other specified disorders of bone density and structure, unspecified site: Secondary | ICD-10-CM | POA: Diagnosis not present

## 2015-03-04 DIAGNOSIS — R935 Abnormal findings on diagnostic imaging of other abdominal regions, including retroperitoneum: Secondary | ICD-10-CM | POA: Diagnosis not present

## 2015-03-04 DIAGNOSIS — N319 Neuromuscular dysfunction of bladder, unspecified: Secondary | ICD-10-CM | POA: Diagnosis not present

## 2015-03-04 DIAGNOSIS — E1151 Type 2 diabetes mellitus with diabetic peripheral angiopathy without gangrene: Secondary | ICD-10-CM | POA: Diagnosis not present

## 2015-03-11 DIAGNOSIS — E119 Type 2 diabetes mellitus without complications: Secondary | ICD-10-CM | POA: Diagnosis not present

## 2015-03-11 DIAGNOSIS — F78 Other intellectual disabilities: Secondary | ICD-10-CM | POA: Diagnosis not present

## 2015-03-11 DIAGNOSIS — M150 Primary generalized (osteo)arthritis: Secondary | ICD-10-CM | POA: Diagnosis not present

## 2015-03-14 DIAGNOSIS — Z23 Encounter for immunization: Secondary | ICD-10-CM | POA: Diagnosis not present

## 2015-04-03 DIAGNOSIS — E063 Autoimmune thyroiditis: Secondary | ICD-10-CM | POA: Diagnosis not present

## 2015-05-02 DIAGNOSIS — F79 Unspecified intellectual disabilities: Secondary | ICD-10-CM | POA: Diagnosis not present

## 2015-05-02 DIAGNOSIS — E1129 Type 2 diabetes mellitus with other diabetic kidney complication: Secondary | ICD-10-CM | POA: Diagnosis not present

## 2015-05-02 DIAGNOSIS — I10 Essential (primary) hypertension: Secondary | ICD-10-CM | POA: Diagnosis not present

## 2015-05-02 DIAGNOSIS — R260 Ataxic gait: Secondary | ICD-10-CM | POA: Diagnosis not present

## 2015-05-16 DIAGNOSIS — M810 Age-related osteoporosis without current pathological fracture: Secondary | ICD-10-CM | POA: Diagnosis not present

## 2015-05-16 DIAGNOSIS — E1165 Type 2 diabetes mellitus with hyperglycemia: Secondary | ICD-10-CM | POA: Diagnosis not present

## 2015-05-16 DIAGNOSIS — I10 Essential (primary) hypertension: Secondary | ICD-10-CM | POA: Diagnosis not present

## 2015-05-16 DIAGNOSIS — E1129 Type 2 diabetes mellitus with other diabetic kidney complication: Secondary | ICD-10-CM | POA: Diagnosis not present

## 2015-05-16 DIAGNOSIS — E039 Hypothyroidism, unspecified: Secondary | ICD-10-CM | POA: Diagnosis not present

## 2015-05-16 DIAGNOSIS — F79 Unspecified intellectual disabilities: Secondary | ICD-10-CM | POA: Diagnosis not present

## 2015-06-16 DIAGNOSIS — H1089 Other conjunctivitis: Secondary | ICD-10-CM | POA: Diagnosis not present

## 2015-06-24 DIAGNOSIS — R402421 Glasgow coma scale score 9-12, in the field [EMT or ambulance]: Secondary | ICD-10-CM | POA: Diagnosis not present

## 2015-06-24 DIAGNOSIS — R748 Abnormal levels of other serum enzymes: Secondary | ICD-10-CM | POA: Diagnosis not present

## 2015-06-24 DIAGNOSIS — Z79899 Other long term (current) drug therapy: Secondary | ICD-10-CM | POA: Diagnosis not present

## 2015-06-24 DIAGNOSIS — N390 Urinary tract infection, site not specified: Secondary | ICD-10-CM | POA: Diagnosis not present

## 2015-06-24 DIAGNOSIS — E039 Hypothyroidism, unspecified: Secondary | ICD-10-CM | POA: Diagnosis not present

## 2015-06-24 DIAGNOSIS — E119 Type 2 diabetes mellitus without complications: Secondary | ICD-10-CM | POA: Diagnosis not present

## 2015-06-24 DIAGNOSIS — G9341 Metabolic encephalopathy: Secondary | ICD-10-CM | POA: Diagnosis not present

## 2015-06-24 DIAGNOSIS — B349 Viral infection, unspecified: Secondary | ICD-10-CM | POA: Diagnosis not present

## 2015-06-24 DIAGNOSIS — R509 Fever, unspecified: Secondary | ICD-10-CM | POA: Diagnosis not present

## 2015-06-24 DIAGNOSIS — E860 Dehydration: Secondary | ICD-10-CM | POA: Diagnosis not present

## 2015-06-24 DIAGNOSIS — I10 Essential (primary) hypertension: Secondary | ICD-10-CM | POA: Diagnosis not present

## 2015-06-24 DIAGNOSIS — R4182 Altered mental status, unspecified: Secondary | ICD-10-CM | POA: Diagnosis not present

## 2015-06-24 DIAGNOSIS — B954 Other streptococcus as the cause of diseases classified elsewhere: Secondary | ICD-10-CM | POA: Diagnosis not present

## 2015-06-24 DIAGNOSIS — Z794 Long term (current) use of insulin: Secondary | ICD-10-CM | POA: Diagnosis not present

## 2015-06-27 DIAGNOSIS — R4182 Altered mental status, unspecified: Secondary | ICD-10-CM | POA: Diagnosis not present

## 2015-06-27 DIAGNOSIS — I10 Essential (primary) hypertension: Secondary | ICD-10-CM | POA: Diagnosis not present

## 2015-06-27 DIAGNOSIS — N390 Urinary tract infection, site not specified: Secondary | ICD-10-CM | POA: Diagnosis not present

## 2015-06-27 DIAGNOSIS — H10022 Other mucopurulent conjunctivitis, left eye: Secondary | ICD-10-CM | POA: Diagnosis not present

## 2015-06-30 DIAGNOSIS — H2513 Age-related nuclear cataract, bilateral: Secondary | ICD-10-CM | POA: Diagnosis not present

## 2015-07-04 DIAGNOSIS — M810 Age-related osteoporosis without current pathological fracture: Secondary | ICD-10-CM | POA: Diagnosis not present

## 2015-07-04 DIAGNOSIS — R079 Chest pain, unspecified: Secondary | ICD-10-CM | POA: Diagnosis not present

## 2015-07-04 DIAGNOSIS — I10 Essential (primary) hypertension: Secondary | ICD-10-CM | POA: Diagnosis not present

## 2015-07-04 DIAGNOSIS — E119 Type 2 diabetes mellitus without complications: Secondary | ICD-10-CM | POA: Diagnosis not present

## 2015-07-04 DIAGNOSIS — M1991 Primary osteoarthritis, unspecified site: Secondary | ICD-10-CM | POA: Diagnosis not present

## 2015-07-04 DIAGNOSIS — Z681 Body mass index (BMI) 19 or less, adult: Secondary | ICD-10-CM | POA: Diagnosis not present

## 2015-07-04 DIAGNOSIS — L0100 Impetigo, unspecified: Secondary | ICD-10-CM | POA: Diagnosis not present

## 2015-08-01 DIAGNOSIS — L603 Nail dystrophy: Secondary | ICD-10-CM | POA: Diagnosis not present

## 2015-08-01 DIAGNOSIS — E1142 Type 2 diabetes mellitus with diabetic polyneuropathy: Secondary | ICD-10-CM | POA: Diagnosis not present

## 2015-08-27 DIAGNOSIS — R32 Unspecified urinary incontinence: Secondary | ICD-10-CM | POA: Diagnosis not present

## 2015-08-27 DIAGNOSIS — E1165 Type 2 diabetes mellitus with hyperglycemia: Secondary | ICD-10-CM | POA: Diagnosis not present

## 2015-09-02 ENCOUNTER — Ambulatory Visit (INDEPENDENT_AMBULATORY_CARE_PROVIDER_SITE_OTHER): Payer: Medicare Other | Admitting: Urology

## 2015-09-02 ENCOUNTER — Ambulatory Visit (HOSPITAL_COMMUNITY)
Admission: RE | Admit: 2015-09-02 | Discharge: 2015-09-02 | Disposition: A | Discharge status: Home / Self Care | Payer: Medicare Other | Source: Ambulatory Visit | Attending: Urology | Admitting: Urology

## 2015-09-02 ENCOUNTER — Other Ambulatory Visit: Payer: Self-pay | Admitting: Urology

## 2015-09-02 DIAGNOSIS — I10 Essential (primary) hypertension: Secondary | ICD-10-CM | POA: Insufficient documentation

## 2015-09-02 DIAGNOSIS — N200 Calculus of kidney: Secondary | ICD-10-CM | POA: Insufficient documentation

## 2015-09-02 DIAGNOSIS — N3020 Other chronic cystitis without hematuria: Secondary | ICD-10-CM | POA: Diagnosis not present

## 2015-09-02 DIAGNOSIS — N320 Bladder-neck obstruction: Secondary | ICD-10-CM | POA: Diagnosis not present

## 2015-09-02 DIAGNOSIS — E119 Type 2 diabetes mellitus without complications: Secondary | ICD-10-CM | POA: Diagnosis not present

## 2015-09-05 DIAGNOSIS — M810 Age-related osteoporosis without current pathological fracture: Secondary | ICD-10-CM | POA: Diagnosis not present

## 2015-09-05 DIAGNOSIS — E039 Hypothyroidism, unspecified: Secondary | ICD-10-CM | POA: Diagnosis not present

## 2015-09-05 DIAGNOSIS — E1129 Type 2 diabetes mellitus with other diabetic kidney complication: Secondary | ICD-10-CM | POA: Diagnosis not present

## 2015-10-02 DIAGNOSIS — R809 Proteinuria, unspecified: Secondary | ICD-10-CM | POA: Diagnosis not present

## 2015-10-02 DIAGNOSIS — E1122 Type 2 diabetes mellitus with diabetic chronic kidney disease: Secondary | ICD-10-CM | POA: Diagnosis not present

## 2015-10-02 DIAGNOSIS — N189 Chronic kidney disease, unspecified: Secondary | ICD-10-CM | POA: Diagnosis not present

## 2015-10-02 DIAGNOSIS — I10 Essential (primary) hypertension: Secondary | ICD-10-CM | POA: Diagnosis not present

## 2015-10-02 DIAGNOSIS — N2581 Secondary hyperparathyroidism of renal origin: Secondary | ICD-10-CM | POA: Diagnosis not present

## 2015-10-02 DIAGNOSIS — I129 Hypertensive chronic kidney disease with stage 1 through stage 4 chronic kidney disease, or unspecified chronic kidney disease: Secondary | ICD-10-CM | POA: Diagnosis not present

## 2015-10-15 DIAGNOSIS — N390 Urinary tract infection, site not specified: Secondary | ICD-10-CM | POA: Diagnosis not present

## 2015-10-17 DIAGNOSIS — L603 Nail dystrophy: Secondary | ICD-10-CM | POA: Diagnosis not present

## 2015-10-17 DIAGNOSIS — E1142 Type 2 diabetes mellitus with diabetic polyneuropathy: Secondary | ICD-10-CM | POA: Diagnosis not present

## 2015-10-28 DIAGNOSIS — R32 Unspecified urinary incontinence: Secondary | ICD-10-CM | POA: Diagnosis not present

## 2015-11-02 ENCOUNTER — Encounter (HOSPITAL_COMMUNITY): Payer: Self-pay

## 2015-11-02 ENCOUNTER — Inpatient Hospital Stay (HOSPITAL_COMMUNITY): Payer: Medicare Other

## 2015-11-02 ENCOUNTER — Emergency Department (HOSPITAL_COMMUNITY): Payer: Medicare Other

## 2015-11-02 ENCOUNTER — Inpatient Hospital Stay (HOSPITAL_COMMUNITY)
Admission: EM | Admit: 2015-11-02 | Discharge: 2015-11-11 | DRG: 871 | Disposition: A | Discharge status: Skilled Nursing Facility | Payer: Medicare Other | Attending: Family Medicine | Admitting: Family Medicine

## 2015-11-02 DIAGNOSIS — G9349 Other encephalopathy: Secondary | ICD-10-CM | POA: Diagnosis present

## 2015-11-02 DIAGNOSIS — I10 Essential (primary) hypertension: Secondary | ICD-10-CM | POA: Diagnosis not present

## 2015-11-02 DIAGNOSIS — M4000 Postural kyphosis, site unspecified: Secondary | ICD-10-CM | POA: Diagnosis not present

## 2015-11-02 DIAGNOSIS — R278 Other lack of coordination: Secondary | ICD-10-CM | POA: Diagnosis not present

## 2015-11-02 DIAGNOSIS — Z794 Long term (current) use of insulin: Secondary | ICD-10-CM

## 2015-11-02 DIAGNOSIS — F72 Severe intellectual disabilities: Secondary | ICD-10-CM | POA: Diagnosis not present

## 2015-11-02 DIAGNOSIS — E870 Hyperosmolality and hypernatremia: Secondary | ICD-10-CM | POA: Diagnosis not present

## 2015-11-02 DIAGNOSIS — R4182 Altered mental status, unspecified: Secondary | ICD-10-CM | POA: Diagnosis not present

## 2015-11-02 DIAGNOSIS — Z8249 Family history of ischemic heart disease and other diseases of the circulatory system: Secondary | ICD-10-CM | POA: Diagnosis not present

## 2015-11-02 DIAGNOSIS — Z87440 Personal history of urinary (tract) infections: Secondary | ICD-10-CM

## 2015-11-02 DIAGNOSIS — D72829 Elevated white blood cell count, unspecified: Secondary | ICD-10-CM | POA: Diagnosis not present

## 2015-11-02 DIAGNOSIS — E860 Dehydration: Secondary | ICD-10-CM | POA: Diagnosis not present

## 2015-11-02 DIAGNOSIS — E1165 Type 2 diabetes mellitus with hyperglycemia: Secondary | ICD-10-CM | POA: Diagnosis present

## 2015-11-02 DIAGNOSIS — N390 Urinary tract infection, site not specified: Secondary | ICD-10-CM | POA: Diagnosis present

## 2015-11-02 DIAGNOSIS — R402421 Glasgow coma scale score 9-12, in the field [EMT or ambulance]: Secondary | ICD-10-CM | POA: Diagnosis not present

## 2015-11-02 DIAGNOSIS — E039 Hypothyroidism, unspecified: Secondary | ICD-10-CM | POA: Diagnosis not present

## 2015-11-02 DIAGNOSIS — R339 Retention of urine, unspecified: Secondary | ICD-10-CM | POA: Diagnosis not present

## 2015-11-02 DIAGNOSIS — R1310 Dysphagia, unspecified: Secondary | ICD-10-CM | POA: Diagnosis present

## 2015-11-02 DIAGNOSIS — R109 Unspecified abdominal pain: Secondary | ICD-10-CM

## 2015-11-02 DIAGNOSIS — E1139 Type 2 diabetes mellitus with other diabetic ophthalmic complication: Secondary | ICD-10-CM | POA: Diagnosis not present

## 2015-11-02 DIAGNOSIS — Z7401 Bed confinement status: Secondary | ICD-10-CM | POA: Diagnosis not present

## 2015-11-02 DIAGNOSIS — N3000 Acute cystitis without hematuria: Secondary | ICD-10-CM | POA: Diagnosis not present

## 2015-11-02 DIAGNOSIS — M6281 Muscle weakness (generalized): Secondary | ICD-10-CM | POA: Diagnosis not present

## 2015-11-02 DIAGNOSIS — N200 Calculus of kidney: Secondary | ICD-10-CM | POA: Diagnosis not present

## 2015-11-02 DIAGNOSIS — A419 Sepsis, unspecified organism: Secondary | ICD-10-CM | POA: Diagnosis not present

## 2015-11-02 DIAGNOSIS — I517 Cardiomegaly: Secondary | ICD-10-CM | POA: Diagnosis not present

## 2015-11-02 DIAGNOSIS — R000 Tachycardia, unspecified: Secondary | ICD-10-CM | POA: Diagnosis not present

## 2015-11-02 DIAGNOSIS — A4189 Other specified sepsis: Secondary | ICD-10-CM | POA: Diagnosis not present

## 2015-11-02 DIAGNOSIS — F79 Unspecified intellectual disabilities: Secondary | ICD-10-CM

## 2015-11-02 DIAGNOSIS — E119 Type 2 diabetes mellitus without complications: Secondary | ICD-10-CM | POA: Diagnosis not present

## 2015-11-02 DIAGNOSIS — G9340 Encephalopathy, unspecified: Secondary | ICD-10-CM

## 2015-11-02 DIAGNOSIS — E876 Hypokalemia: Secondary | ICD-10-CM | POA: Diagnosis not present

## 2015-11-02 DIAGNOSIS — R279 Unspecified lack of coordination: Secondary | ICD-10-CM | POA: Diagnosis not present

## 2015-11-02 DIAGNOSIS — R1312 Dysphagia, oropharyngeal phase: Secondary | ICD-10-CM | POA: Diagnosis not present

## 2015-11-02 LAB — URINE MICROSCOPIC-ADD ON

## 2015-11-02 LAB — CBC WITH DIFFERENTIAL/PLATELET
BASOS ABS: 0 10*3/uL (ref 0.0–0.1)
BASOS ABS: 0 10*3/uL (ref 0.0–0.1)
BASOS PCT: 0 %
Basophils Relative: 0 %
EOS PCT: 0 %
Eosinophils Absolute: 0 10*3/uL (ref 0.0–0.7)
Eosinophils Absolute: 0 10*3/uL (ref 0.0–0.7)
Eosinophils Relative: 0 %
HEMATOCRIT: 44.6 % (ref 39.0–52.0)
HEMATOCRIT: 37.9 % — AB (ref 39.0–52.0)
HEMOGLOBIN: 14.8 g/dL (ref 13.0–17.0)
Hemoglobin: 12.4 g/dL — ABNORMAL LOW (ref 13.0–17.0)
LYMPHS ABS: 0.5 10*3/uL — AB (ref 0.7–4.0)
LYMPHS PCT: 3 %
LYMPHS PCT: 5 %
Lymphs Abs: 0.4 10*3/uL — ABNORMAL LOW (ref 0.7–4.0)
MCH: 30.7 pg (ref 26.0–34.0)
MCH: 29.7 pg (ref 26.0–34.0)
MCHC: 33.2 g/dL (ref 30.0–36.0)
MCHC: 32.7 g/dL (ref 30.0–36.0)
MCV: 92.5 fL (ref 78.0–100.0)
MCV: 90.7 fL (ref 78.0–100.0)
MONO ABS: 0.5 10*3/uL (ref 0.1–1.0)
MONO ABS: 0.8 10*3/uL (ref 0.1–1.0)
MONOS PCT: 7 %
Monocytes Relative: 4 %
NEUTROS ABS: 12 10*3/uL — AB (ref 1.7–7.7)
NEUTROS ABS: 9.2 10*3/uL — AB (ref 1.7–7.7)
NEUTROS PCT: 93 %
Neutrophils Relative %: 88 %
PLATELETS: 247 10*3/uL (ref 150–400)
Platelets: 248 10*3/uL (ref 150–400)
RBC: 4.82 MIL/uL (ref 4.22–5.81)
RBC: 4.18 MIL/uL — ABNORMAL LOW (ref 4.22–5.81)
RDW: 12.1 % (ref 11.5–15.5)
RDW: 12.2 % (ref 11.5–15.5)
WBC: 13 10*3/uL — AB (ref 4.0–10.5)
WBC: 10.5 10*3/uL (ref 4.0–10.5)

## 2015-11-02 LAB — COMPREHENSIVE METABOLIC PANEL
ALBUMIN: 4.3 g/dL (ref 3.5–5.0)
ALT: 7 U/L — ABNORMAL LOW (ref 17–63)
ALT: 10 U/L — ABNORMAL LOW (ref 17–63)
ANION GAP: 11 (ref 5–15)
AST: 29 U/L (ref 15–41)
AST: 39 U/L (ref 15–41)
Albumin: 3.7 g/dL (ref 3.5–5.0)
Alkaline Phosphatase: 72 U/L (ref 38–126)
Alkaline Phosphatase: 62 U/L (ref 38–126)
Anion gap: 10 (ref 5–15)
BILIRUBIN TOTAL: 0.7 mg/dL (ref 0.3–1.2)
BILIRUBIN TOTAL: 0.8 mg/dL (ref 0.3–1.2)
BUN: 13 mg/dL (ref 6–20)
BUN: 14 mg/dL (ref 6–20)
CO2: 23 mmol/L (ref 22–32)
CO2: 21 mmol/L — ABNORMAL LOW (ref 22–32)
Calcium: 9.4 mg/dL (ref 8.9–10.3)
Calcium: 8.5 mg/dL — ABNORMAL LOW (ref 8.9–10.3)
Chloride: 102 mmol/L (ref 101–111)
Chloride: 106 mmol/L (ref 101–111)
Creatinine, Ser: 1.22 mg/dL (ref 0.61–1.24)
Creatinine, Ser: 1.1 mg/dL (ref 0.61–1.24)
GFR calc non Af Amer: >60 mL/min (ref >60)
GLUCOSE: 153 mg/dL — AB (ref 65–99)
Glucose, Bld: 223 mg/dL — ABNORMAL HIGH (ref 65–99)
POTASSIUM: 4.3 mmol/L (ref 3.5–5.1)
POTASSIUM: 3.9 mmol/L (ref 3.5–5.1)
Sodium: 136 mmol/L (ref 135–145)
Sodium: 137 mmol/L (ref 135–145)
TOTAL PROTEIN: 7.5 g/dL (ref 6.5–8.1)
TOTAL PROTEIN: 6.9 g/dL (ref 6.5–8.1)

## 2015-11-02 LAB — URINALYSIS, ROUTINE W REFLEX MICROSCOPIC
BILIRUBIN URINE: NEGATIVE
GLUCOSE, UA: NEGATIVE mg/dL
Nitrite: NEGATIVE
Protein, ur: NEGATIVE mg/dL
SPECIFIC GRAVITY, URINE: 1.01 (ref 1.005–1.030)
pH: 7 (ref 5.0–8.0)

## 2015-11-02 LAB — GLUCOSE, CAPILLARY
GLUCOSE-CAPILLARY: 231 mg/dL — AB (ref 65–99)
Glucose-Capillary: 196 mg/dL — ABNORMAL HIGH (ref 65–99)

## 2015-11-02 LAB — PROTIME-INR
INR: 1.16 (ref 0.00–1.49)
PROTHROMBIN TIME: 15 s (ref 11.6–15.2)

## 2015-11-02 LAB — CBG MONITORING, ED: GLUCOSE-CAPILLARY: 156 mg/dL — AB (ref 65–99)

## 2015-11-02 LAB — PROCALCITONIN: Procalcitonin: 0.15 ng/mL

## 2015-11-02 LAB — TSH: TSH: 1.47 u[IU]/mL (ref 0.350–4.500)

## 2015-11-02 LAB — APTT: aPTT: 28 seconds (ref 24–37)

## 2015-11-02 LAB — I-STAT CG4 LACTIC ACID, ED
LACTIC ACID, VENOUS: 1.93 mmol/L (ref 0.5–2.0)
Lactic Acid, Venous: 1.8 mmol/L (ref 0.5–2.0)

## 2015-11-02 LAB — LACTIC ACID, PLASMA
LACTIC ACID, VENOUS: 1.3 mmol/L (ref 0.5–2.0)
LACTIC ACID, VENOUS: 1.3 mmol/L (ref 0.5–2.0)

## 2015-11-02 LAB — MRSA PCR SCREENING: MRSA BY PCR: POSITIVE — AB

## 2015-11-02 MED ORDER — SODIUM CHLORIDE 0.9 % IV BOLUS (SEPSIS)
1000.0000 mL | Freq: Once | INTRAVENOUS | Status: AC
  Administered 2015-11-02: 1000 mL via INTRAVENOUS

## 2015-11-02 MED ORDER — SODIUM CHLORIDE 0.9 % IV SOLN: INTRAVENOUS | Status: DC

## 2015-11-02 MED ORDER — SODIUM CHLORIDE 0.9% FLUSH
3.0000 mL | Freq: Two times a day (BID) | INTRAVENOUS | Status: DC
  Administered 2015-11-02 – 2015-11-11 (×12): 3 mL via INTRAVENOUS

## 2015-11-02 MED ORDER — INSULIN ASPART 100 UNIT/ML ~~LOC~~ SOLN
0.0000 [IU] | Freq: Every day | SUBCUTANEOUS | Status: DC
  Administered 2015-11-03 – 2015-11-06 (×3): 2 [IU] via SUBCUTANEOUS
  Administered 2015-11-07: 1 [IU] via SUBCUTANEOUS

## 2015-11-02 MED ORDER — INSULIN ASPART 100 UNIT/ML ~~LOC~~ SOLN
0.0000 [IU] | Freq: Three times a day (TID) | SUBCUTANEOUS | Status: DC
  Administered 2015-11-02 – 2015-11-03 (×4): 3 [IU] via SUBCUTANEOUS
  Administered 2015-11-04 (×2): 5 [IU] via SUBCUTANEOUS
  Administered 2015-11-04: 3 [IU] via SUBCUTANEOUS
  Administered 2015-11-05 (×2): 2 [IU] via SUBCUTANEOUS
  Administered 2015-11-05: 7 [IU] via SUBCUTANEOUS
  Administered 2015-11-06: 5 [IU] via SUBCUTANEOUS
  Administered 2015-11-06: 2 [IU] via SUBCUTANEOUS
  Administered 2015-11-06: 5 [IU] via SUBCUTANEOUS
  Administered 2015-11-07: 7 [IU] via SUBCUTANEOUS
  Administered 2015-11-07 (×2): 5 [IU] via SUBCUTANEOUS
  Administered 2015-11-08: 2 [IU] via SUBCUTANEOUS
  Administered 2015-11-08: 5 [IU] via SUBCUTANEOUS
  Administered 2015-11-08: 2 [IU] via SUBCUTANEOUS
  Administered 2015-11-09: 3 [IU] via SUBCUTANEOUS
  Administered 2015-11-09: 1 [IU] via SUBCUTANEOUS
  Administered 2015-11-09 – 2015-11-10 (×3): 3 [IU] via SUBCUTANEOUS
  Administered 2015-11-10: 2 [IU] via SUBCUTANEOUS
  Administered 2015-11-11: 3 [IU] via SUBCUTANEOUS
  Administered 2015-11-11: 2 [IU] via SUBCUTANEOUS

## 2015-11-02 MED ORDER — ACETAMINOPHEN 650 MG RE SUPP
650.0000 mg | RECTAL | Status: AC
  Administered 2015-11-02: 650 mg via RECTAL
  Filled 2015-11-02: qty 1

## 2015-11-02 MED ORDER — ONDANSETRON HCL 4 MG/2ML IJ SOLN: 4.0000 mg | Freq: Four times a day (QID) | INTRAMUSCULAR | Status: DC | PRN

## 2015-11-02 MED ORDER — CHLORHEXIDINE GLUCONATE CLOTH 2 % EX PADS
6.0000 | MEDICATED_PAD | Freq: Every day | CUTANEOUS | Status: AC
  Administered 2015-11-03 – 2015-11-07 (×5): 6 via TOPICAL

## 2015-11-02 MED ORDER — DEXTROSE 5 % IV SOLN
INTRAVENOUS | Status: AC
  Filled 2015-11-02: qty 10

## 2015-11-02 MED ORDER — INSULIN ASPART 100 UNIT/ML ~~LOC~~ SOLN
3.0000 [IU] | Freq: Three times a day (TID) | SUBCUTANEOUS | Status: DC
  Administered 2015-11-04 – 2015-11-11 (×17): 3 [IU] via SUBCUTANEOUS

## 2015-11-02 MED ORDER — ONDANSETRON HCL 4 MG/2ML IJ SOLN: 4.0000 mg | Freq: Three times a day (TID) | INTRAMUSCULAR | Status: DC | PRN

## 2015-11-02 MED ORDER — ACETAMINOPHEN 650 MG RE SUPP
650.0000 mg | Freq: Four times a day (QID) | RECTAL | Status: DC | PRN
Start: 2015-11-02 — End: 2015-11-11
  Administered 2015-11-03 – 2015-11-07 (×3): 650 mg via RECTAL
  Filled 2015-11-02 (×3): qty 1

## 2015-11-02 MED ORDER — CEFTRIAXONE SODIUM 1 G IJ SOLR
1.0000 g | INTRAMUSCULAR | Status: DC
  Administered 2015-11-03 – 2015-11-08 (×6): 1 g via INTRAVENOUS
  Filled 2015-11-02 (×8): qty 10

## 2015-11-02 MED ORDER — ONDANSETRON HCL 4 MG PO TABS: 4.0000 mg | ORAL_TABLET | Freq: Four times a day (QID) | ORAL | Status: DC | PRN

## 2015-11-02 MED ORDER — ACETAMINOPHEN 325 MG PO TABS
650.0000 mg | ORAL_TABLET | Freq: Four times a day (QID) | ORAL | Status: DC | PRN
  Administered 2015-11-08 – 2015-11-09 (×2): 650 mg via ORAL
  Filled 2015-11-02 (×3): qty 2

## 2015-11-02 MED ORDER — SODIUM CHLORIDE 0.9 % IV SOLN
INTRAVENOUS | Status: DC
  Administered 2015-11-03 – 2015-11-06 (×5): via INTRAVENOUS

## 2015-11-02 MED ORDER — SENNOSIDES-DOCUSATE SODIUM 8.6-50 MG PO TABS: 1.0000 | ORAL_TABLET | Freq: Every evening | ORAL | Status: DC | PRN

## 2015-11-02 MED ORDER — MUPIROCIN 2 % EX OINT
1.0000 "application " | TOPICAL_OINTMENT | Freq: Two times a day (BID) | CUTANEOUS | Status: AC
  Administered 2015-11-02 – 2015-11-07 (×10): 1 via NASAL
  Filled 2015-11-02: qty 22

## 2015-11-02 MED ORDER — SODIUM CHLORIDE 0.9 % IV SOLN
1000.0000 mL | INTRAVENOUS | Status: DC
  Administered 2015-11-02: 1000 mL via INTRAVENOUS

## 2015-11-02 MED ORDER — DEXTROSE 5 % IV SOLN
2.0000 g | Freq: Once | INTRAVENOUS | Status: AC
  Administered 2015-11-02: 2 g via INTRAVENOUS
  Filled 2015-11-02: qty 2

## 2015-11-02 NOTE — ED Notes (Signed)
Pt's sister here now. States she saw her brother yesterday and he was talking.

## 2015-11-02 NOTE — ED Notes (Signed)
[facility] possible altered mental statatus ( pt not talking today) pt has MR

## 2015-11-02 NOTE — ED Notes (Signed)
Report given to floor. Pt ready for transport. 

## 2015-11-02 NOTE — H&P (Signed)
History and Physical    [name]:[id number] DOB: 1956/02/25 DOA: 11/02/2015  Referring MD/NP/PA: Eber Hong, EDP PCP: Cassell Smiles., MD  Patient coming from: Assisted living facility  Chief Complaint: Confusion  HPI: [name] is a 60 y.o. male with a history of mental retardation, hypothyroidism and diabetes who presents to the hospital today with the above complaint. History is provided by his sister and guardian [name] at bedside. She relates that at baseline he is communicative, is able to walk with a walker, feeds himself. She last saw him normal yesterday afternoon at about 3 PM. When she came to see him this morning he was unresponsive. His assisted-living facility transfer him to the hospital for further evaluation, he was found to be febrile to 101.1, tachycardic in the 120s, leukocytosis of 13, chest x-ray without acute abnormalities, urinalysis was cloudy, with many bacteria and too numerous to count WBCs, negative nitrite. It is believed that he has sepsis secondary to UTI and admission has been requested for further evaluation and management.  Past Medical/Surgical History: Past Medical History  Diagnosis Date  . MR (mental retardation)   . Hypertension   . UTI (urinary tract infection)   . Diabetes mellitus   . DKA (diabetic ketoacidoses) (HCC)   . Gram-positive bacteremia   . Hypernatremia   . Sepsis(995.91)   . Renal disorder   . Hypokalemia     History reviewed. No pertinent past surgical history.   reports that he has never smoked. He has never used smokeless tobacco. He reports that he does not drink alcohol or use illicit drugs.  Allergies: No Known Allergies  Family History:  History of hypertension in mother  Prior to Admission medications   Medication Sig Start Date End Date Taking? Authorizing Provider  acetaminophen (TYLENOL) 500 MG tablet Take 500 mg by mouth every 6 (six) hours as needed.   Yes Historical Provider, MD  Alum & Mag  Hydroxide-Simeth (ANTACID & ANTIGAS) 200-200-20 MG/5ML SUSP Take 30 mLs by mouth daily as needed (heartburn).   Yes Historical Provider, MD  benzonatate (TESSALON) 100 MG capsule Take 100 mg by mouth 3 (three) times daily as needed for cough.   Yes Historical Provider, MD  furosemide (LASIX) 20 MG tablet Take 20 mg by mouth 2 (two) times daily.    Yes Historical Provider, MD  HUMALOG KWIKPEN 100 UNIT/ML KiwkPen Inject into the skin 3 (three) times daily. Per sliding scale 10/09/15  Yes Historical Provider, MD  insulin glargine (LANTUS) 100 UNIT/ML injection Inject 0.35 mLs (35 Units total) into the skin at bedtime. Patient taking differently: Inject 40 Units into the skin at bedtime.  03/06/14  Yes Maryann Mikhail, DO  labetalol (NORMODYNE) 300 MG tablet Take 300 mg by mouth 2 (two) times daily.  10/30/15  Yes Historical Provider, MD  levothyroxine (SYNTHROID, LEVOTHROID) 50 MCG tablet Take 50 mcg by mouth daily before breakfast.   Yes Historical Provider, MD  magnesium hydroxide (MILK OF MAGNESIA) 400 MG/5ML suspension Take 30 mLs by mouth daily as needed for mild constipation.   Yes Historical Provider, MD  magnesium sulfate (EPSOM SALT) GRAN Apply 1 g topically 3 (three) times daily. Soak both feet in luke warm water and epsom salt for 10 minutes.   Yes Historical Provider, MD  Potassium Chloride 40 MEQ/15ML (20%) SOLN Take 7.5 mLs by mouth daily.   Yes Historical Provider, MD  tamsulosin (FLOMAX) 0.4 MG CAPS capsule Take 0.4 mg by mouth every other day.  10/30/15  Yes Historical Provider, MD  tolterodine (DETROL) 1 MG tablet Take 1 mg by mouth daily as needed (bladder).   Yes Historical Provider, MD    Review of Systems:  Unable to obtain given current mental state   Physical Exam: Filed Vitals:   11/02/15 1430 11/02/15 1456 11/02/15 1458 11/02/15 1525  BP: 164/99 163/93  146/80  Pulse: 132 129  124  Temp:   99.1 F (37.3 C) 99.2 F (37.3 C)  TempSrc:   Oral Oral  Resp: 20 24    Height:     5\' 8"  (1.727 m)  Weight:    77.111 kg (170 lb)  SpO2: 97% 98%  98%     Constitutional: Lying in bed, eyes closed, nonpurposeful movements of upper extremities, repetitive Eyes: PERRL, lids and conjunctivae normal ENMT: Mucous membranes are dry. Poor dentition.  Neck: normal, supple, no masses, no thyromegaly Respiratory: Coarse bilateral breath sounds  Cardiovascular: Tachycardic, soft systolic ejection murmur  Abdomen: no tenderness, no masses palpated. No hepatosplenomegaly. Bowel sounds positive.  Musculoskeletal: no clubbing / cyanosis. No joint deformity upper and lower extremities. Good ROM, no contractures. Normal muscle tone.  Skin: no rashes, lesions, ulcers. No induration Neurologic: CN 2-12 grossly intact. Sensation intact, DTR normal. Strength 5/5 in all 4.  Psychiatric: Normal judgment and insight. Alert and oriented x 3. Normal mood.    Labs on Admission: I have personally reviewed the following labs and imaging studies  CBC:  Recent Labs Lab 11/02/15 0807  WBC 13.0*  NEUTROABS 12.0*  HGB 14.8  HCT 44.6  MCV 92.5  PLT 248   Basic Metabolic Panel:  Recent Labs Lab 11/02/15 0807  NA 136  K 4.3  CL 102  CO2 23  GLUCOSE 153*  BUN 13  CREATININE 1.22  CALCIUM 9.4   GFR: Estimated Creatinine Clearance: 63.9 mL/min (by C-G formula based on Cr of 1.22). Liver Function Tests:  Recent Labs Lab 11/02/15 0807  AST 29  ALT 7*  ALKPHOS 72  BILITOT 0.7  PROT 7.5  ALBUMIN 4.3   No results for input(s): LIPASE, AMYLASE in the last 168 hours. No results for input(s): AMMONIA in the last 168 hours. Coagulation Profile: No results for input(s): INR, PROTIME in the last 168 hours. Cardiac Enzymes: No results for input(s): CKTOTAL, CKMB, CKMBINDEX, TROPONINI in the last 168 hours. BNP (last 3 results) No results for input(s): PROBNP in the last 8760 hours. HbA1C: No results for input(s): HGBA1C in the last 72 hours. CBG:  Recent Labs Lab  11/02/15 0844  GLUCAP 156*   Lipid Profile: No results for input(s): CHOL, HDL, LDLCALC, TRIG, CHOLHDL, LDLDIRECT in the last 72 hours. Thyroid Function Tests: No results for input(s): TSH, T4TOTAL, FREET4, T3FREE, THYROIDAB in the last 72 hours. Anemia Panel: No results for input(s): VITAMINB12, FOLATE, FERRITIN, TIBC, IRON, RETICCTPCT in the last 72 hours. Urine analysis:    Component Value Date/Time   COLORURINE YELLOW 11/02/2015 1003   APPEARANCEUR HAZY* 11/02/2015 1003   LABSPEC 1.010 11/02/2015 1003   PHURINE 7.0 11/02/2015 1003   GLUCOSEU NEGATIVE 11/02/2015 1003   HGBUR TRACE* 11/02/2015 1003   BILIRUBINUR NEGATIVE 11/02/2015 1003   KETONESUR TRACE* 11/02/2015 1003   PROTEINUR NEGATIVE 11/02/2015 1003   UROBILINOGEN 0.2 04/15/2014 1946   NITRITE NEGATIVE 11/02/2015 1003   LEUKOCYTESUR LARGE* 11/02/2015 1003   Sepsis Labs: @LABRCNTIP (procalcitonin:4,lacticidven:4) ) Recent Results (from the past 240 hour(s))  Blood Culture (routine x 2)     Status: None (Preliminary result)  Collection Time: 11/02/15  8:07 AM  Result Value Ref Range Status   Specimen Description BLOOD RIGHT ANTECUBITAL  Final   Special Requests BOTTLES DRAWN AEROBIC AND ANAEROBIC 6CC  Final   Culture PENDING  Incomplete   Report Status PENDING  Incomplete  Blood Culture (routine x 2)     Status: None (Preliminary result)   Collection Time: 11/02/15  8:18 AM  Result Value Ref Range Status   Specimen Description BLOOD BLOOD RIGHT HAND  Final   Special Requests BOTTLES DRAWN AEROBIC AND ANAEROBIC [facility]6CC  Final   Culture PENDING  Incomplete   Report Status PENDING  Incomplete     Radiological Exams on Admission: Dg Chest Port 1 View  11/02/2015  CLINICAL DATA:  Altered mental status. EXAM: PORTABLE CHEST 1 VIEW COMPARISON:  08/04/2012 FINDINGS: Mild cardiomegaly. No confluent opacities, effusions or overt edema. No acute bony abnormality. IMPRESSION: Mild cardiomegaly.  No active disease.  Electronically Signed   By: Charlett NoseKevin  Dover M.D.   On: 11/02/2015 08:46    EKG: Independently reviewed. Sinus tachycardia at a rate of 120, normal axis, no acute ischemic abnormalities  Assessment/Plan Principal Problem:   Sepsis (HCC) Active Problems:   UTI (urinary tract infection)   Sinus tachycardia (HCC)   Dehydration   Leukocytosis   DM type 2 causing eye disease, not at goal [facility](HCC)    Sepsis secondary to urinary tract infection -Patient meets criteria for sepsis on basis of fever, tachycardia, leukocytosis with source being the urine and end organ failure as evidenced by confusion and encephalopathy. -Has been bolused IV fluids in the emergency department and given 2 g of Rocephin. -We will continue treatment with IV fluids and antibiotics pending culture data.  Sinus tachycardia  -likely manifestation of sepsis and dehydration, continue IV fluids.  Insulin-dependent diabetes -Check hemoglobin A1c, place on sensitive sliding scale insulin as he remains nothing by mouth due to his acute encephalopathy.  Acute encephalopathy -Secondary to sepsis and UTI. -Suspect will improve as these conditions are treated. To further support this, his sister states that [facility] 4 months [facility] and [facility] and this resolved after a day or 2 in the hospital.   DVT prophylaxis: Lovenox  Code Status: Full code  Family Communication: Sister and guardian [name] at bedside updated on plan of care and all questions answered  Disposition Plan: Keep in the hospital for anticipated 2-3 days  Consults called: None  Admission status: Inpatient    Time Spent: 75 minutes  Chaya JanHERNANDEZ ACOSTA,ESTELA MD [facility] Pager [phone]  If 7PM-7AM, please contact night-coverage [email or web address] Password [email or web address]  11/02/2015, 4:15 PM

## 2015-11-02 NOTE — ED Provider Notes (Signed)
CSN: [id number]     Arrival date & time 11/02/15  0745 History   First MD Initiated Contact with Patient 11/02/15 [phone]     Chief Complaint  Patient presents with  . Altered Mental Status     (Consider location/radiation/quality/duration/timing/severity/associated sxs/prior Treatment) HPI Comments: The patient is a 60 year old male, he is from a group home atmosphere, he does have a history of diabetes as well as mental retardation, history of sepsis, history of urinary tract infection. [facility] stated that he was not his normal self in that he was not talking at all today. Evidently his baseline is that he is able to talk. The patient is unable to give any information, he does not answer questions, the paramedics noted that he was tachycardic in route but had no other information regarding clinical status.  Patient is a 60 y.o. male presenting with altered mental status. The history is provided by the patient.  Altered Mental Status   Past Medical History  Diagnosis Date  . MR (mental retardation)   . Hypertension   . UTI (urinary tract infection)   . Diabetes mellitus   . DKA (diabetic ketoacidoses) (HCC)   . Gram-positive bacteremia   . Hypernatremia   . Sepsis(995.91)   . Renal disorder   . Hypokalemia    History reviewed. No pertinent past surgical history. No family history on file. Social History  Substance Use Topics  . Smoking status: Never Smoker   . Smokeless tobacco: Never Used  . Alcohol Use: No    Review of Systems  Unable to perform ROS: Mental status change      Allergies  Review of patient's allergies indicates no known allergies.  Home Medications   Prior to Admission medications   Medication Sig Start Date End Date Taking? Authorizing Provider  acetaminophen (TYLENOL) 500 MG tablet Take 500 mg by mouth every 6 (six) hours as needed.   Yes Historical Provider, MD  Alum & Mag Hydroxide-Simeth  (ANTACID & ANTIGAS) 200-200-20 MG/5ML SUSP Take 30 mLs by mouth daily as needed (heartburn).   Yes Historical Provider, MD  benzonatate (TESSALON) 100 MG capsule Take 100 mg by mouth 3 (three) times daily as needed for cough.   Yes Historical Provider, MD  furosemide (LASIX) 20 MG tablet Take 20 mg by mouth 2 (two) times daily.    Yes Historical Provider, MD  HUMALOG KWIKPEN 100 UNIT/ML KiwkPen Inject into the skin 3 (three) times daily. Per sliding scale 10/09/15  Yes Historical Provider, MD  insulin glargine (LANTUS) 100 UNIT/ML injection Inject 0.35 mLs (35 Units total) into the skin at bedtime. Patient taking differently: Inject 40 Units into the skin at bedtime.  03/06/14  Yes Maryann Mikhail, DO  labetalol (NORMODYNE) 300 MG tablet Take 300 mg by mouth 2 (two) times daily.  10/30/15  Yes Historical Provider, MD  levothyroxine (SYNTHROID, LEVOTHROID) 50 MCG tablet Take 50 mcg by mouth daily before breakfast.   Yes Historical Provider, MD  magnesium hydroxide (MILK OF MAGNESIA) 400 MG/5ML suspension Take 30 mLs by mouth daily as needed for mild constipation.   Yes Historical Provider, MD  magnesium sulfate (EPSOM SALT) GRAN Apply 1 g topically 3 (three) times daily. Soak both feet in luke warm water and epsom salt for 10 minutes.   Yes Historical Provider, MD  Potassium Chloride 40 MEQ/15ML (20%) SOLN Take 7.5 mLs by mouth daily.   Yes Historical Provider, MD  tamsulosin (FLOMAX) 0.4  MG CAPS capsule Take 0.4 mg by mouth every other day.  10/30/15  Yes Historical Provider, MD  tolterodine (DETROL) 1 MG tablet Take 1 mg by mouth daily as needed (bladder).   Yes Historical Provider, MD   BP 153/86 mmHg  Pulse 128  Temp(Src) 100 F (37.8 C) (Rectal)  Resp 20  Ht  (1.727 m)  Wt 170 lb (77.111 kg)  BMI 25.85 kg/m2  SpO2 100% Physical Exam  Constitutional:  Uncomfortable appearing  HENT:  Oropharynx is dry, dentition is very poor  Eyes:  Pupils are equal round and reactive, the conjunctiva  are clear  Neck:  No lymphadenopathy of the neck, no torticollis  Cardiovascular:  Tachycardic, regular, pulses palpable at radial arteries, no JVD  Pulmonary/Chest:  No increased work of breathing, no rales or wheezing  Abdominal:  Soft nontender abdomen, no distention  Musculoskeletal:  1+ bilateral symmetrical pitting edema below the knees  Neurological:  The patient does not follow commands, he does respond to painful stimuli, he is awake alert but does not speak  Skin:  Scattered excoriations, no petechiae or purpura, no urticaria, no vesicles or pustules    ED Course  Procedures (including critical care time) Labs Review Labs Reviewed  COMPREHENSIVE METABOLIC PANEL - Abnormal; Notable for the following:    Glucose, Bld 153 (*)    ALT 7 (*)    All other components within normal limits  CBC WITH DIFFERENTIAL/PLATELET - Abnormal; Notable for the following:    WBC 13.0 (*)    Neutro Abs 12.0 (*)    Lymphs Abs 0.4 (*)    All other components within normal limits  URINALYSIS, ROUTINE W REFLEX MICROSCOPIC (NOT AT [facility]) - Abnormal; Notable for the following:    APPearance HAZY (*)    Hgb urine dipstick TRACE (*)    Ketones, ur TRACE (*)    Leukocytes, UA LARGE (*)    All other components within normal limits  URINE MICROSCOPIC-ADD ON - Abnormal; Notable for the following:    Squamous Epithelial / LPF 0-5 (*)    Bacteria, UA MANY (*)    All other components within normal limits  CBG MONITORING, ED - Abnormal; Notable for the following:    Glucose-Capillary 156 (*)    All other components within normal limits  CULTURE, BLOOD (ROUTINE X 2)  CULTURE, BLOOD (ROUTINE X 2)  URINE CULTURE  I-STAT CG4 LACTIC ACID, ED  I-STAT CG4 LACTIC ACID, ED    Imaging Review Dg Chest Port 1 View  11/02/2015  CLINICAL DATA:  Altered mental status. EXAM: PORTABLE CHEST 1 VIEW COMPARISON:  08/04/2012 FINDINGS: Mild cardiomegaly. No confluent opacities, effusions or overt edema. No acute bony  abnormality. IMPRESSION: Mild cardiomegaly.  No active disease. Electronically Signed   By: Charlett Nose M.D.   On: 11/02/2015 08:46   I have personally reviewed and evaluated these images and lab results as part of my medical decision-making.   EKG Interpretation   Date/Time:  Sunday November 02 2015 08:12:06 EDT Ventricular Rate:  119 PR Interval:  131 QRS Duration: 102 QT Interval:  356 QTC Calculation: 501 R Axis:   74 Text Interpretation:  Sinus tachycardia Inferior infarct, old Prolonged QT  interval Since last tracing rate faster Abnormal ekg Confirmed by Hyacinth Meeker   MD, Jamia Hoban ([id number]) on 11/02/2015 8:47:44 AM      MDM   Final diagnoses:  Altered mental state  Sepsis, due to unspecified organism [facility])  UTI (lower urinary tract infection)  The patient is ill-appearing with a tachycardia and altered mental status. The etiology of this could be multifactorial. We'll rule out metabolic sources such as DKA or electrolyte disturbances, acute renal failure or severe dehydration, we'll also evaluate for sepsis, less likely to be cardiac source though we will check EKG.  The pt has ongoing tachycairda - slightly worsening however no hypotension and lactic < 2.  Cultures obtained - urine obvious source - had > 1500cc in bladder.    Rocephin IV X 2 grams Bolus  Fever improving after tylenol D/w hospitalist who will admit.  SIRS + Source = Sepsis.  Also fever and altered = qSOFA.  Not in shock.  Medications  0.9 %  sodium chloride infusion (0 mLs Intravenous Stopped 11/02/15 1043)  cefTRIAXone (ROCEPHIN) 2 g in dextrose 5 % 50 mL IVPB (0 g Intravenous Stopped 11/02/15 1128)  sodium chloride 0.9 % bolus 1,000 mL (0 mLs Intravenous Stopped 11/02/15 1149)  acetaminophen (TYLENOL) suppository 650 mg (650 mg Rectal Given 11/02/15 1058)   CRITICAL CARE Performed by: Vida Roller Total critical care time: 35  minutes Critical care time was exclusive of separately billable procedures and  treating other patients. Critical care was necessary to treat or prevent imminent or life-threatening deterioration. Critical care was time spent personally by me on the following activities: development of treatment plan with patient and/or surrogate as well as nursing, discussions with consultants, evaluation of patient's response to treatment, examination of patient, obtaining history from patient or surrogate, ordering and performing treatments and interventions, ordering and review of laboratory studies, ordering and review of radiographic studies, pulse oximetry and re-evaluation of patient's condition.    Eber Hong, MD 11/02/15 1225

## 2015-11-02 NOTE — ED Notes (Signed)
Several attempts made at in and out cath by 2 nurses with no success.  Wife states catheter insertion has been difficult in the past.  Changed to foley per Dr. Hyacinth MeekerMiller.  Inserted without difficulty.

## 2015-11-02 NOTE — ED Notes (Signed)
Attempted to insert foley twice but unable to insert due to penis being retracted. EDP aware

## 2015-11-03 DIAGNOSIS — E1139 Type 2 diabetes mellitus with other diabetic ophthalmic complication: Secondary | ICD-10-CM

## 2015-11-03 LAB — CBC
HCT: 41.2 % (ref 39.0–52.0)
Hemoglobin: 13.5 g/dL (ref 13.0–17.0)
MCH: 29.7 pg (ref 26.0–34.0)
MCHC: 32.8 g/dL (ref 30.0–36.0)
MCV: 90.7 fL (ref 78.0–100.0)
PLATELETS: 284 10*3/uL (ref 150–400)
RBC: 4.54 MIL/uL (ref 4.22–5.81)
RDW: 12.3 % (ref 11.5–15.5)
WBC: 14.1 10*3/uL — AB (ref 4.0–10.5)

## 2015-11-03 LAB — BASIC METABOLIC PANEL
Anion gap: 11 (ref 5–15)
BUN: 17 mg/dL (ref 6–20)
CHLORIDE: 109 mmol/L (ref 101–111)
CO2: 21 mmol/L — ABNORMAL LOW (ref 22–32)
CREATININE: 1.05 mg/dL (ref 0.61–1.24)
Calcium: 8.5 mg/dL — ABNORMAL LOW (ref 8.9–10.3)
Glucose, Bld: 230 mg/dL — ABNORMAL HIGH (ref 65–99)
POTASSIUM: 3.8 mmol/L (ref 3.5–5.1)
SODIUM: 141 mmol/L (ref 135–145)

## 2015-11-03 LAB — HEMOGLOBIN A1C
Hgb A1c MFr Bld: 6.8 % — ABNORMAL HIGH (ref 4.8–5.6)
MEAN PLASMA GLUCOSE: 148 mg/dL

## 2015-11-03 LAB — GLUCOSE, CAPILLARY
Glucose-Capillary: 237 mg/dL — ABNORMAL HIGH (ref 65–99)
Glucose-Capillary: 226 mg/dL — ABNORMAL HIGH (ref 65–99)
Glucose-Capillary: 237 mg/dL — ABNORMAL HIGH (ref 65–99)

## 2015-11-03 NOTE — Progress Notes (Signed)
Inpatient Diabetes Program Recommendations  AACE/ADA: New Consensus Statement on Inpatient Glycemic Control (2015)  Target Ranges:  Prepandial:   less than 140 mg/dL      Peak postprandial:   less than 180 mg/dL (1-2 hours)      Critically ill patients:  140 - 180 mg/dL  Results for [name], [name] (MRN [id number]) as of 11/03/2015 09:34  Ref. Range 11/02/2015 08:44 11/02/2015 17:03 11/02/2015 22:16 11/03/2015 08:02  Glucose-Capillary Latest Ref Range: 65-99 mg/dL 409156 (H) 811231 (H) 914196 (H) 237 (H)   Review of Glycemic Control  Diabetes history: DM2 Outpatient Diabetes medications: Lantus 40 units QHS, Humalog TID with meals (correction scale) Current orders for Inpatient glycemic control: Novolog 0-9 units TID with meals, Novolog 0-5 units QHS, Novolog 3 units TID with meals for meal coverage  Inpatient Diabetes Program Recommendations: Insulin - Basal: Please consider ordering Lantus 15 units QHS (based on 77 kg x 0.2 units). HgbA1C: A1C in process.  Thanks, Orlando PennerMarie Lundon Verdejo, RN, MSN, CDE Diabetes Coordinator Inpatient Diabetes Program [phone] (Team Pager from 8am to 5pm) [phone] ([facility] office) [phone] [facility] office) [phone] [facility] office)

## 2015-11-03 NOTE — Clinical Social Work Note (Signed)
Clinical Social Work Assessment  Patient Details  Name: [name] MRN: [id number] Date of Birth: 03-10-1956  Date of referral:  11/03/15               Reason for consult:  Discharge Planning                Permission sought to share information with:  Chartered certified accountant granted to share information::  Yes, Verbal Permission Granted  Name::        Agency::  [facility]  Relationship::  facility  Contact Information:     Housing/Transportation Living arrangements for the past 2 months:  [place] of Information:  Guardian Patient Interpreter Needed:  None Criminal Activity/Legal Involvement Pertinent to Current Situation/Hospitalization:  No - Comment as needed Significant Relationships:  Siblings Lives with:  Facility Resident Do you feel safe going back to the place where you live?  Yes Need for family participation in patient care:  Yes (Comment)  Care giving concerns:  None reported. Pt is long term resident at ALF.    Social Worker assessment / plan:  CSW met with pt's sister/legal guardian, Hassan Rowan at bedside. She reports pt has been unresponsive since admission. Pt has been a resident at [facility] since November of last year. He lives there with his mother. Brenda visits them daily. At baseline, he is communicative. Per Alwyn Ren at facility, pt ambulates with a walker and feeds himself. No home health prior to admission. Pt is okay to return.   Employment status:  Disabled (Comment on whether or not currently receiving Disability) Insurance information:  Managed Medicare PT Recommendations:  Not assessed at this time Information / Referral to community resources:  Other (Comment Required) (return to [facility])  Patient/Family's Response to care:  Pt's sister requests return to [facility] when medically stable.   Patient/Family's Understanding of and Emotional Response to Diagnosis, Current Treatment, and Prognosis:   Pt's sister is very knowledgeable of pt's health history and understands admission diagnosis.    Emotional Assessment Appearance:  Appears stated age Attitude/Demeanor/Rapport:  Unable to Assess Affect (typically observed):  Unable to Assess Orientation:    Alcohol / Substance use:  Not Applicable Psych involvement (Current and /or in the community):  No (Comment)  Discharge Needs  Concerns to be addressed:  Discharge Planning Concerns Readmission within the last 30 days:  No Current discharge risk:  None Barriers to Discharge:  Continued Medical Work up   Salome Arnt, [place] 11/03/2015, 9:46 AM [phone]

## 2015-11-03 NOTE — Care Management Important Message (Signed)
Important Message  Patient Details  Name: Jacob GavelGene R [name] MRN: [id number] Date of Birth: 1956-04-01   Medicare Important Message Given:  Yes    Malcolm MetroChildress, Rolf Fells Demske, RN 11/03/2015, 11:24 AM

## 2015-11-03 NOTE — Progress Notes (Signed)
PROGRESS NOTE    [name]  NUU:[id number] DOB: 1955-12-16 DOA: 11/02/2015 PCP: Cassell Smiles., MD     Brief Narrative:  60 year old man with mental retardation here from SNF with acute encephalopathy. Found to have UTI which is believed to be source of infection has been admitted for further evaluation and management.   Assessment & Plan:   Principal Problem:   Sepsis (HCC) Active Problems:   UTI (urinary tract infection)   Sinus tachycardia (HCC)   Dehydration   Leukocytosis   DM type 2 causing eye disease, not at goal [facility])   Sepsis secondary to UTI -Sepsis parameters have improved although remains somewhat tachycardic. -Confusion is improved as well although he is still moderate at baseline per patient's sister and guardian [name] at bedside. -Continue Rocephin and IV fluids.  Sinus tachycardia -Likely a manifestation of sepsis and dehydration, continue IV fluids.  Acute encephalopathy -Secondary to sepsis and UTI. -Improving although still not at baseline.  Insulin-dependent diabetes -Fair control, can adjust insulin as needed.   DVT prophylaxis: Lovenox Code Status: Full code Family Communication: Sister at bedside updated on plan of care and all questions answered Disposition Plan: Back to ALF when ready  Consultants:   None  Procedures:   None  Antimicrobials:   Rocephin    Subjective: Lying in bed, opens eyes to voice and makes eye contact, nonverbal  Objective: Filed Vitals:   11/02/15 1525 11/02/15 2204 11/03/15 0614 11/03/15 1400  BP: 146/80 151/77 144/93 155/89  Pulse: 124 125 101 114  Temp: 99.2 F (37.3 C) 99 F (37.2 C) 99.2 F (37.3 C) 99 F (37.2 C)  TempSrc: Oral Oral Axillary Axillary  Resp:  20 20 20   Height: 5\' 8"  (1.727 m)     Weight: 77.111 kg (170 lb)     SpO2: 98% 100% 95% 98%    Intake/Output Summary (Last 24 hours) at 11/03/15 1541 Last data filed at 11/03/15 1344  Gross per 24 hour  Intake 1011.25 ml    Output   2000 ml  Net -988.75 ml   Filed Weights   11/02/15 0837 11/02/15 1525  Weight: 77.111 kg (170 lb) 77.111 kg (170 lb)    Examination:  General exam: Drowsy but arousable, nonverbal Respiratory system: Clear to auscultation. Respiratory effort normal. Cardiovascular system: Tachycardic, regular rhythm. No murmurs, rubs, gallops. Gastrointestinal system: Abdomen is nondistended, soft and nontender. No organomegaly or masses felt. Normal bowel sounds heard. Central nervous system: Alert and oriented. No focal neurological deficits. Extremities: No C/C/E, +pedal pulses Skin: No rashes, lesions or ulcers Psychiatry: Unable to assess given current mental state    Data Reviewed: I have personally reviewed following labs and imaging studies  CBC:  Recent Labs Lab 11/02/15 0807 11/02/15 1642 11/03/15 0406  WBC 13.0* 10.5 14.1*  NEUTROABS 12.0* 9.2*  --   HGB 14.8 12.4* 13.5  HCT 44.6 37.9* 41.2  MCV 92.5 90.7 90.7  PLT 248 247 284   Basic Metabolic Panel:  Recent Labs Lab 11/02/15 0807 11/02/15 1642 11/03/15 0406  NA 136 137 141  K 4.3 3.9 3.8  CL 102 106 109  CO2 23 21* 21*  GLUCOSE 153* 223* 230*  BUN 13 14 17   CREATININE 1.22 1.10 1.05  CALCIUM 9.4 8.5* 8.5*   GFR: Estimated Creatinine Clearance: 74.2 mL/min (by C-G formula based on Cr of 1.05). Liver Function Tests:  Recent Labs Lab 11/02/15 0807 11/02/15 1642  AST 29 39  ALT 7* 10*  ALKPHOS 72  62  BILITOT 0.7 0.8  PROT 7.5 6.9  ALBUMIN 4.3 3.7   No results for input(s): LIPASE, AMYLASE in the last 168 hours. No results for input(s): AMMONIA in the last 168 hours. Coagulation Profile:  Recent Labs Lab 11/02/15 1642  INR 1.16   Cardiac Enzymes: No results for input(s): CKTOTAL, CKMB, CKMBINDEX, TROPONINI in the last 168 hours. BNP (last 3 results) No results for input(s): PROBNP in the last 8760 hours. HbA1C:  Recent Labs  11/02/15 0807  HGBA1C 6.8*   CBG:  Recent Labs Lab  11/02/15 0844 11/02/15 1703 11/02/15 2216 11/03/15 0802 11/03/15 1133  GLUCAP 156* 231* 196* 237* 226*   Lipid Profile: No results for input(s): CHOL, HDL, LDLCALC, TRIG, CHOLHDL, LDLDIRECT in the last 72 hours. Thyroid Function Tests:  Recent Labs  11/02/15 0807  TSH 1.470   Anemia Panel: No results for input(s): VITAMINB12, FOLATE, FERRITIN, TIBC, IRON, RETICCTPCT in the last 72 hours. Urine analysis:    Component Value Date/Time   COLORURINE YELLOW 11/02/2015 1003   APPEARANCEUR HAZY* 11/02/2015 1003   LABSPEC 1.010 11/02/2015 1003   PHURINE 7.0 11/02/2015 1003   GLUCOSEU NEGATIVE 11/02/2015 1003   HGBUR TRACE* 11/02/2015 1003   BILIRUBINUR NEGATIVE 11/02/2015 1003   KETONESUR TRACE* 11/02/2015 1003   PROTEINUR NEGATIVE 11/02/2015 1003   UROBILINOGEN 0.2 04/15/2014 1946   NITRITE NEGATIVE 11/02/2015 1003   LEUKOCYTESUR LARGE* 11/02/2015 1003   Sepsis Labs: @LABRCNTIP (procalcitonin:4,lacticidven:4)  ) Recent Results (from the past 240 hour(s))  Blood Culture (routine x 2)     Status: None (Preliminary result)   Collection Time: 11/02/15  8:07 AM  Result Value Ref Range Status   Specimen Description BLOOD RIGHT ANTECUBITAL  Final   Special Requests BOTTLES DRAWN AEROBIC AND ANAEROBIC 6CC  Final   Culture NO GROWTH 1 DAY  Final   Report Status PENDING  Incomplete  Blood Culture (routine x 2)     Status: None (Preliminary result)   Collection Time: 11/02/15  8:18 AM  Result Value Ref Range Status   Specimen Description BLOOD BLOOD RIGHT HAND  Final   Special Requests BOTTLES DRAWN AEROBIC AND ANAEROBIC 6CC  Final   Culture NO GROWTH 1 DAY  Final   Report Status PENDING  Incomplete  MRSA PCR Screening     Status: Abnormal   Collection Time: 11/02/15  3:39 PM  Result Value Ref Range Status   MRSA by PCR POSITIVE (A) NEGATIVE Final    Comment:        The GeneXpert MRSA Assay (FDA approved for NASAL specimens only), is one component of a comprehensive MRSA  colonization surveillance program. It is not intended to diagnose MRSA infection nor to guide or monitor treatment for MRSA infections. RESULT CALLED TO, READ BACK BY AND VERIFIED WITH:  THOMAS,C @ 1922 ON 11/02/15 BY [facility]          Radiology Studies: Dg Chest Port 1 View  11/02/2015  CLINICAL DATA:  Diabetes with hypertension. Hypernatremia. Concern for potential sepsis EXAM: PORTABLE CHEST 1 VIEW COMPARISON:  November 02, 2015 study obtained earlier in the day FINDINGS: There is no edema or consolidation. Heart is slightly enlarged with pulmonary vascularity within normal limits. No adenopathy. No bone lesions. IMPRESSION: Mild cardiac enlargement.  No edema or consolidation. Electronically Signed   By: Bretta Bang III M.D.   On: 11/02/2015 17:03   Dg Chest Port 1 View  11/02/2015  CLINICAL DATA:  Altered mental status. EXAM: PORTABLE CHEST 1 VIEW  COMPARISON:  08/04/2012 FINDINGS: Mild cardiomegaly. No confluent opacities, effusions or overt edema. No acute bony abnormality. IMPRESSION: Mild cardiomegaly.  No active disease. Electronically Signed   By: Charlett Nose M.D.   On: 11/02/2015 08:46        Scheduled Meds: . cefTRIAXone (ROCEPHIN)  IV  1 g Intravenous Q24H  . Chlorhexidine Gluconate Cloth  6 each Topical Q0600  . insulin aspart  0-5 Units Subcutaneous QHS  . insulin aspart  0-9 Units Subcutaneous TID WC  . insulin aspart  3 Units Subcutaneous TID WC  . mupirocin ointment  1 application Nasal BID  . sodium chloride flush  3 mL Intravenous Q12H   Continuous Infusions: . sodium chloride 75 mL/hr at 11/03/15 0347     LOS: 1 day    Time spent: 25 minutes. Greater than 50% of this time was spent in direct contact with the patient coordinating care.     Chaya Jan, MD [facility] Pager [phone]  If 7PM-7AM, please contact night-coverage [email or web address] Password [email or web address] 11/03/2015, 3:41 PM

## 2015-11-03 NOTE — Care Management Note (Signed)
Case Management Note  Patient Details  Name: [name] MRN: [id number] Date of Birth: [birth year]   Expected Discharge Date:  11/06/15               Expected Discharge Plan:  Assisted Living / Rest Home  In-House Referral:     Discharge planning Services  CM Consult  Post Acute Care Choice:  NA Choice offered to:  NA  DME Arranged:    DME Agency:     HH Arranged:    HH Agency:     Status of Service:  In process, will continue to follow  Medicare Important Message Given:  Yes Date Medicare IM Given:    Medicare IM give by:    Date Additional Medicare IM Given:    Additional Medicare Important Message give by:     If discussed at Long Length of Stay Meetings, dates discussed:    Additional Comments: [facility]. Has MR. Anticipated patient will be [facility]. Sister not here to discuss discharge planing. CSW aware of patient and will be arranging [facility]. Will cont. To follow.   [name], [name], RN 11/03/2015, 12:33 PM

## 2015-11-04 LAB — GLUCOSE, CAPILLARY
GLUCOSE-CAPILLARY: 232 mg/dL — AB (ref 65–99)
GLUCOSE-CAPILLARY: 254 mg/dL — AB (ref 65–99)
GLUCOSE-CAPILLARY: 201 mg/dL — AB (ref 65–99)
Glucose-Capillary: 214 mg/dL — ABNORMAL HIGH (ref 65–99)
Glucose-Capillary: 262 mg/dL — ABNORMAL HIGH (ref 65–99)

## 2015-11-04 LAB — URINE CULTURE: CULTURE: NO GROWTH

## 2015-11-04 MED ORDER — GUAIFENESIN-DM 100-10 MG/5ML PO SYRP
5.0000 mL | ORAL_SOLUTION | ORAL | Status: DC | PRN
  Administered 2015-11-10 – 2015-11-11 (×2): 5 mL via ORAL
  Filled 2015-11-04 (×2): qty 5

## 2015-11-04 MED ORDER — INSULIN GLARGINE 100 UNIT/ML ~~LOC~~ SOLN
5.0000 [IU] | Freq: Every day | SUBCUTANEOUS | Status: DC
  Administered 2015-11-04 – 2015-11-06 (×3): 5 [IU] via SUBCUTANEOUS
  Filled 2015-11-04 (×6): qty 0.05

## 2015-11-04 MED ORDER — ENOXAPARIN SODIUM 40 MG/0.4ML ~~LOC~~ SOLN
40.0000 mg | SUBCUTANEOUS | Status: DC
  Administered 2015-11-04 – 2015-11-10 (×7): 40 mg via SUBCUTANEOUS
  Filled 2015-11-04 (×7): qty 0.4

## 2015-11-04 MED ORDER — LABETALOL HCL 5 MG/ML IV SOLN
10.0000 mg | INTRAVENOUS | Status: DC | PRN
  Administered 2015-11-04: 20 mg via INTRAVENOUS
  Filled 2015-11-04: qty 4

## 2015-11-04 NOTE — Consult Note (Signed)
   [facility] CM Inpatient Consult   11/04/2015  Joanne GavelGene R Ybanez 22-Oct-1955 [id number]  Patient screened for potential [facility] Care Management services. Patient is eligible for [facility] Services. Electronic medical record reveals patient's discharge plan is to return to Assisted Living facility, there were no identifiable [facility] care management needs at this time. [facility] services not appropriate at this time. If patient's post hospital needs change please place a [facility] Care [facility] consult.  Of note, [facility] program services would not interfere or replace any services arranged by inpatient case management or social work. For questions please contact:    Alben SpittleMary E. Albertha GheeNiemczura, RN, BSN, [facility]   Hospital Liaison [facility] [phone]) Business Cell  [phone]) Toll Free Office

## 2015-11-04 NOTE — Progress Notes (Signed)
BP 173/80 manual. No PRN medication in MAR. Midline paged/notified. Medication orders for PRN.  Meds given. RN will continue to monitor. Lesly Dukesachel J Everett, RN

## 2015-11-04 NOTE — Progress Notes (Signed)
Pt noted to have moments of VTach, but only when having coughing spells.

## 2015-11-04 NOTE — Progress Notes (Signed)
PROGRESS NOTE    Jacob GavelGene R [name]  ZOX:[id number] DOB: [birth year] DOA: 11/02/2015 PCP: Cassell SmilesFUSCO,LAWRENCE J., MD     Brief Narrative:  60 year old man with mental retardation here from SNF with acute encephalopathy. Found to have UTI which is believed to be source of infection has been admitted for further evaluation and management. As of 6/6 he has improved, however was still febrile overnight to 102 and per sister his mentation is still not back to baseline.   Assessment & Plan:   Principal Problem:   Sepsis (HCC) Active Problems:   UTI (urinary tract infection)   Sinus tachycardia (HCC)   Dehydration   Leukocytosis   DM type 2 causing eye disease, not at goal [facility](HCC)   Sepsis secondary to UTI -Sepsis parameters have improved although remains somewhat tachycardic. -Confusion is improved as well although he is still not at baseline per patient's sister and guardian Steward DroneBrenda at bedside. -Continue Rocephin and IV fluids.  Sinus tachycardia -Likely a manifestation of sepsis and dehydration, continue IV fluids.  Acute encephalopathy -Secondary to sepsis and UTI. -Improving although still not at baseline.  Insulin-dependent diabetes -Uncontrolled. -Add Lantus 5 units at bedtime.   DVT prophylaxis: Lovenox Code Status: Full code Family Communication: Sister at bedside updated on plan of care and all questions answered Disposition Plan: Back to ALF when ready  Consultants:   None  Procedures:   None  Antimicrobials:   Rocephin    Subjective: Lying in bed, opens eyes to voice and makes eye contact, nonverbal  Objective: Filed Vitals:   11/03/15 1400 11/03/15 2149 11/04/15 0041 11/04/15 1257  BP: 155/89 151/84  136/85  Pulse: 114 106  114  Temp: 99 F (37.2 C) 102 F (38.9 C) 99.3 F (37.4 C) 100.4 F (38 C)  TempSrc: Axillary Axillary  Axillary  Resp: 20 20  20   Height:      Weight:      SpO2: 98% 97%  98%    Intake/Output Summary (Last 24 hours) at  11/04/15 1515 Last data filed at 11/04/15 1300  Gross per 24 hour  Intake    920 ml  Output   2850 ml  Net  -1930 ml   Filed Weights   11/02/15 0837 11/02/15 1525  Weight: 77.111 kg (170 lb) 77.111 kg (170 lb)    Examination:  General exam: Drowsy but arousable, nonverbal Respiratory system: Clear to auscultation. Respiratory effort normal. Cardiovascular system: Tachycardic, regular rhythm. No murmurs, rubs, gallops. Gastrointestinal system: Abdomen is nondistended, soft and nontender. No organomegaly or masses felt. Normal bowel sounds heard. Central nervous system: Alert and oriented. No focal neurological deficits. Extremities: No C/C/E, +pedal pulses Skin: No rashes, lesions or ulcers Psychiatry: Unable to assess given current mental state    Data Reviewed: I have personally reviewed following labs and imaging studies  CBC:  Recent Labs Lab 11/02/15 0807 11/02/15 1642 11/03/15 0406  WBC 13.0* 10.5 14.1*  NEUTROABS 12.0* 9.2*  --   HGB 14.8 12.4* 13.5  HCT 44.6 37.9* 41.2  MCV 92.5 90.7 90.7  PLT 248 247 284   Basic Metabolic Panel:  Recent Labs Lab 11/02/15 0807 11/02/15 1642 11/03/15 0406  NA 136 137 141  K 4.3 3.9 3.8  CL 102 106 109  CO2 23 21* 21*  GLUCOSE 153* 223* 230*  BUN 13 14 17   CREATININE 1.22 1.10 1.05  CALCIUM 9.4 8.5* 8.5*   GFR: Estimated Creatinine Clearance: 74.2 mL/min (by C-G formula based on Cr of 1.05). Liver  Function Tests:  Recent Labs Lab 11/02/15 0807 11/02/15 1642  AST 29 39  ALT 7* 10*  ALKPHOS 72 62  BILITOT 0.7 0.8  PROT 7.5 6.9  ALBUMIN 4.3 3.7   No results for input(s): LIPASE, AMYLASE in the last 168 hours. No results for input(s): AMMONIA in the last 168 hours. Coagulation Profile:  Recent Labs Lab 11/02/15 1642  INR 1.16   Cardiac Enzymes: No results for input(s): CKTOTAL, CKMB, CKMBINDEX, TROPONINI in the last 168 hours. BNP (last 3 results) No results for input(s): PROBNP in the last 8760  hours. HbA1C:  Recent Labs  11/02/15 0807  HGBA1C 6.8*   CBG:  Recent Labs Lab 11/03/15 1133 11/03/15 1625 11/03/15 2217 11/04/15 0829 11/04/15 1111  GLUCAP 226* 237* 214* 262* 232*   Lipid Profile: No results for input(s): CHOL, HDL, LDLCALC, TRIG, CHOLHDL, LDLDIRECT in the last 72 hours. Thyroid Function Tests:  Recent Labs  11/02/15 0807  TSH 1.470   Anemia Panel: No results for input(s): VITAMINB12, FOLATE, FERRITIN, TIBC, IRON, RETICCTPCT in the last 72 hours. Urine analysis:    Component Value Date/Time   COLORURINE YELLOW 11/02/2015 1003   APPEARANCEUR HAZY* 11/02/2015 1003   LABSPEC 1.010 11/02/2015 1003   PHURINE 7.0 11/02/2015 1003   GLUCOSEU NEGATIVE 11/02/2015 1003   HGBUR TRACE* 11/02/2015 1003   BILIRUBINUR NEGATIVE 11/02/2015 1003   KETONESUR TRACE* 11/02/2015 1003   PROTEINUR NEGATIVE 11/02/2015 1003   UROBILINOGEN 0.2 04/15/2014 1946   NITRITE NEGATIVE 11/02/2015 1003   LEUKOCYTESUR LARGE* 11/02/2015 1003   Sepsis Labs: @LABRCNTIP (procalcitonin:4,lacticidven:4)  ) Recent Results (from the past 240 hour(s))  Blood Culture (routine x 2)     Status: None (Preliminary result)   Collection Time: 11/02/15  8:07 AM  Result Value Ref Range Status   Specimen Description BLOOD RIGHT ANTECUBITAL  Final   Special Requests BOTTLES DRAWN AEROBIC AND ANAEROBIC 6CC  Final   Culture NO GROWTH 2 DAYS  Final   Report Status PENDING  Incomplete  Blood Culture (routine x 2)     Status: None (Preliminary result)   Collection Time: 11/02/15  8:18 AM  Result Value Ref Range Status   Specimen Description BLOOD BLOOD RIGHT HAND  Final   Special Requests BOTTLES DRAWN AEROBIC AND ANAEROBIC 6CC  Final   Culture NO GROWTH 2 DAYS  Final   Report Status PENDING  Incomplete  Urine culture     Status: None   Collection Time: 11/02/15 10:03 AM  Result Value Ref Range Status   Specimen Description URINE, CATHETERIZED  Final   Special Requests NONE  Final   Culture  NO GROWTH Performed at [facility]   Final   Report Status 11/04/2015 FINAL  Final  MRSA PCR Screening     Status: Abnormal   Collection Time: 11/02/15  3:39 PM  Result Value Ref Range Status   MRSA by PCR POSITIVE (A) NEGATIVE Final    Comment:        The GeneXpert MRSA Assay (FDA approved for NASAL specimens only), is one component of a comprehensive MRSA colonization surveillance program. It is not intended to diagnose MRSA infection nor to guide or monitor treatment for MRSA infections. RESULT CALLED TO, READ BACK BY AND VERIFIED WITH:  THOMAS,C @ 1922 ON 11/02/15 BY [facility]          Radiology Studies: Dg Chest Port 1 View  11/02/2015  CLINICAL DATA:  Diabetes with hypertension. Hypernatremia. Concern for potential sepsis EXAM: PORTABLE CHEST 1  VIEW COMPARISON:  November 02, 2015 study obtained earlier in the day FINDINGS: There is no edema or consolidation. Heart is slightly enlarged with pulmonary vascularity within normal limits. No adenopathy. No bone lesions. IMPRESSION: Mild cardiac enlargement.  No edema or consolidation. Electronically Signed   By: Bretta Bang III M.D.   On: 11/02/2015 17:03        Scheduled Meds: . cefTRIAXone (ROCEPHIN)  IV  1 g Intravenous Q24H  . Chlorhexidine Gluconate Cloth  6 each Topical Q0600  . insulin aspart  0-5 Units Subcutaneous QHS  . insulin aspart  0-9 Units Subcutaneous TID WC  . insulin aspart  3 Units Subcutaneous TID WC  . mupirocin ointment  1 application Nasal BID  . sodium chloride flush  3 mL Intravenous Q12H   Continuous Infusions: . sodium chloride 75 mL/hr at 11/03/15 1702     LOS: 2 days    Time spent: 25 minutes. Greater than 50% of this time was spent in direct contact with the patient coordinating care.     Chaya Jan, MD [facility] Pager [phone]  If 7PM-7AM, please contact night-coverage [email or web address] Password [email or web address] 11/04/2015, 3:15 PM

## 2015-11-04 NOTE — Progress Notes (Signed)
Inpatient Diabetes Program Recommendations  AACE/ADA: New Consensus Statement on Inpatient Glycemic Control (2015)  Target Ranges:  Prepandial:   less than 140 mg/dL      Peak postprandial:   less than 180 mg/dL (1-2 hours)      Critically ill patients:  140 - 180 mg/dL  Results for Joanne GavelMURRELL, Tavian R (MRN [id number]) as of 11/04/2015 09:36  Ref. Range 11/03/2015 08:02 11/03/2015 11:33 11/03/2015 16:25 11/03/2015 22:17 11/04/2015 08:29  Glucose-Capillary Latest Ref Range: 65-99 mg/dL 409237 (H) 811226 (H) 914237 (H) 214 (H) 262 (H)   Review of Glycemic Control  Diabetes history: DM2 Outpatient Diabetes medications: Lantus 40 units QHS, Humalog TID with meals (correction scale) Current orders for Inpatient glycemic control: Novolog 0-9 units TID with meals, Novolog 0-5 units QHS, Novolog 3 units TID with meals for meal coverage  Inpatient Diabetes Program Recommendations: Insulin - Basal: According to the chart, patient did NOT eat anything at all meals yesterday. Glucose ranged from 214-237 mg/dl on 7/8/296/5/17 and patient received a total of Novolog 11 units for correction on 11/03/15. Fasting glucose 262 mg/dl this morning. Please consider ordering Lantus 15 units QHS (based on 77 kg x 0.2 units). HgbA1C: A1C 6.8% on 11/02/15 indicating an average glucose of 148 mg/dl over the past 2-3 months.  Thanks, Orlando PennerMarie Doral Ventrella, RN, MSN, CDE Diabetes Coordinator Inpatient Diabetes Program [phone] (Team Pager from 8am to 5pm) [phone] ([facility] office) [phone] [facility] office) [phone] [facility] office)

## 2015-11-05 DIAGNOSIS — N3000 Acute cystitis without hematuria: Secondary | ICD-10-CM

## 2015-11-05 DIAGNOSIS — A419 Sepsis, unspecified organism: Principal | ICD-10-CM

## 2015-11-05 LAB — GLUCOSE, CAPILLARY
GLUCOSE-CAPILLARY: 186 mg/dL — AB (ref 65–99)
Glucose-Capillary: 340 mg/dL — ABNORMAL HIGH (ref 65–99)
Glucose-Capillary: 198 mg/dL — ABNORMAL HIGH (ref 65–99)
Glucose-Capillary: 164 mg/dL — ABNORMAL HIGH (ref 65–99)

## 2015-11-05 MED ORDER — POLYVINYL ALCOHOL 1.4 % OP SOLN
1.0000 [drp] | OPHTHALMIC | Status: DC
  Administered 2015-11-05 – 2015-11-11 (×34): 1 [drp] via OPHTHALMIC
  Filled 2015-11-05 (×3): qty 15

## 2015-11-05 NOTE — Care Management Important Message (Signed)
Important Message  Patient Details  Name: [name] MRN: [id number] Date of Birth: 12-06-1955   Medicare Important Message Given:  Yes    Malcolm MetroChildress, Nikyah Lackman Demske, RN 11/05/2015, 1:16 PM

## 2015-11-05 NOTE — Progress Notes (Signed)
Initial Nutrition Assessment  DOCUMENTATION CODES:  Not applicable  INTERVENTION:  Recommend SLP swallow eval. Pt noted to have severe coughing/gagging and holding food in mouth at lunch. Sister says "He always coughs when he eats".   Downgrade diet to dysphagia 3  NUTRITION DIAGNOSIS:  Inadequate oral intake related to acute illness as evidenced by Reported minimal to no intake x5 days.  GOAL:  Patient will meet greater than or equal to 90% of their needs  MONITOR:  PO intake, Labs, Skin, I & O's  REASON FOR ASSESSMENT:  Low Braden    ASSESSMENT:  59 y/o male w/ PMHx mental retardation, hypothyroidism, dm and resident of ALF who presented with confusion. Worked up for sepsis 2/2 UTI.   On RD arrival, pt in bed with sister present. Pt would not interact and all information was obtained from sister.   Sister reports pt has eaten close to nothing since Saturday (4-5 Days). He would not take fluids. Today, she says, is the first time pt has really eaten since Saturday.   Other then his poor appetite, she knows little else. She is unsure of pt's UBW or if pt is on a therapeutic diet at the ALF. She doesn't know if he looks any thinner than usual.  RD arrived during lunch and Somewhat concerning was pt's coughing when sister was feeding him. She says "He always coughs when he eats". and"He has a little problem with meats". Sister says he had 2  Sets of teeth He lost one set and only wears 1 set. He eats soft foods mainly. He apparently really likes mashed potatoes.   Pt was observed to hold food at front of mouth with incomplete swallowing. The sister would continue to place food in his mouth despite him already having food in his mouth. RD stated she should give pt chance to swallow what is in mouth before giving another bite. RD observed severe coughing/gagging during feeding, very concerning for aspiration. More encouraging was seeing pt drinking large amounts of water through a straw  with no signs/sypmtoms of aspiration.   Much of pt's struggle with PO intake could be due to lethargy. She notes pt is typically able to feed himself and he is no where near his baseline. Potentially, his coughing/choking will resolve as health improves, but will downgrade to dysphagia 3 for now. Recommend SLP eval.   Labs reviewed: Hyperglycemia , WBC increased to 14.1   Recent Labs Lab 11/02/15 0807 11/02/15 1642 11/03/15 0406  NA 136 137 141  K 4.3 3.9 3.8  CL 102 106 109  CO2 23 21* 21*  BUN CREATININE 1.22 1.10 1.05  CALCIUM 9.4 8.5* 8.5*  GLUCOSE 153* 223* 230*   Diet Order:  DIET SOFT Room service appropriate?: Yes; Fluid consistency:: Thin  Skin: Open wound to sacrum 77 Last BM:  Unknown  Height:  Ht Readings from Last 1 Encounters:  11/02/15  (1.727 m)   Weight:  Wt Readings from Last 1 Encounters:  11/02/15 170 lb (77.111 kg)   Wt Readings from Last 10 Encounters:  11/02/15 170 lb (77.111 kg)  04/15/14 169 lb (76.658 kg)  04/14/14 169 lb (76.658 kg)  04/10/14 169 lb (76.658 kg)  03/04/14 169 lb 12.1 oz (77 kg)  08/04/12 152 lb 12.5 oz (69.3 kg)  06/12/11 170 lb (77.111 kg)  06/05/11 173 lb 4.5 oz (78.6 kg)   Ideal Body Weight:  70 kg  BMI:  Body mass index is  25.85 kg/(m^2).  Estimated Nutritional Needs:  Kcal:  1900-2150 kcals (25-28 kcals/kg bw) Protein:  85-100 g (1.1-1.3 g/kg BW) Fluid:  2.2 liters  EDUCATION NEEDS:  No education needs identified at this time  Christophe LouisNathan Franks RD, LDN, CNSC Clinical Nutrition Pager: [phone] 11/05/2015 2:21 PM

## 2015-11-05 NOTE — Progress Notes (Signed)
Patient has foley catheter in place. Placed in ER per report, sister requested it be left in place per report from night shift RN. Patient's sister states was placed in the ER and she would like it left in place due to skin irritation/breakdown to his sacral area. Discussed with Dr. Irene LimboGoodrich. Stated may keep foley in place for now. Earnstine RegalAshley Trinity Haun, RN

## 2015-11-05 NOTE — Progress Notes (Signed)
Patient noted to have swelling to IV site in right forearm this evening. IV site removed. Two nurses attempted new IV access, unable to obtain new access. Nursing supervisor notified to attempt IV site. Night shift RN aware. Earnstine RegalAshley Shalunda Lindh, RN

## 2015-11-05 NOTE — Progress Notes (Signed)
PROGRESS NOTE  [name]:[id number] DOB: 09-Jul-1955 DOA: 11/02/2015 PCP: Cassell Smiles., MD  Brief Narrative: 77 yom with history of mental retardation presented from SNF with complaints of acute encephalopathy. Source of infection found to be UTI and he has been referred for admission.  Assessment/Plan: 1. Sepsis secondary to UTI with associated acute encephalopathy. Has not returned to baseline yet although more awake today and talking and eating.. Low-grade fever last 24 hours, last fever greater than 24 hours. Chest x-ray no acute disease. 2. Presumed UTI although Urine culture no growth, final. Blood cultures no growth thus far. Sister reports the patient was hospitalized twice earlier this year for severe UTI. 3. Diabetes mellitus type 2 on long-acting insulin. 4. Mental retardation.    Per family patient is improving. Continue empiric antibiotics. Check CBC in the am.   Will order artificial tears for dry eyes.  Discontinue telemetry.   Continue IV fluids.  DVT prophylaxis: Lovenox Code Status: Full Family Communication: Sister, [name] (Legal guardian) and brother-in-law bedside.   Disposition Plan: Anticipate discharge in 1-2 days.     Brendia Sacks, MD  [facility] Direct contact: [phone] --Via amion app OR  --[email or web address]; password [email or web address]  7PM-7AM contact night coverage as above 11/05/2015, 10:12 AM  LOS: 3 days   Consultants:  None  Procedures:  None  Antimicrobials:  Rocephin 6/5>>  HPI/Subjective: Per sister he has improved since admission. Was able to drink and eat a small amount today. Spoke for the first time today since admission.  At baseline he is able to walk around. He has chronic bladder problems and follows with urology as an outpatient.  Objective: Filed Vitals:   11/04/15 1257 11/04/15 2129 11/04/15 2247 11/05/15 0646  BP: 136/85 175/86 172/80 144/82  Pulse: 114 118  101  Temp: 100.4 F (38 C) 100.3 F (37.9 C)   100 F (37.8 C)  TempSrc: Axillary Axillary  Axillary  Resp: 20 20    Height:      Weight:      SpO2: 98% 98%      Intake/Output Summary (Last 24 hours) at 11/05/15 1012 Last data filed at 11/05/15 0908  Gross per 24 hour  Intake   2313 ml  Output   1000 ml  Net   1313 ml     Filed Weights   11/02/15 0837 11/02/15 1525  Weight: 77.111 kg (170 lb) 77.111 kg (170 lb)    Exam: Constitutional:  . Appears calm and comfortable Eyes:  . PERRL and irises appear normal . Conjunctivae and lids appear normal ENMT:  . external ears, nose appear normal Respiratory:  . CTA bilaterally, no w/r/r.  . Respiratory effort normal. No retractions or accessory muscle use Cardiovascular:  . RRR, no m/r/g . No LE extremity edema   Abdomen:  . Abdomen appears normal; no tenderness or masses . No hernias noted. . Foley cath in place.  Skin:  . Abrasion on RLE.  Neurologic:  . Can not assess Psychiatric:  o Does not follow commands. o Cannot assess  I have personally reviewed following labs and imaging studies:  CBG 340  Scheduled Meds: . cefTRIAXone (ROCEPHIN)  IV  1 g Intravenous Q24H  . Chlorhexidine Gluconate Cloth  6 each Topical Q0600  . enoxaparin (LOVENOX) injection  40 mg Subcutaneous Q24H  . insulin aspart  0-5 Units Subcutaneous QHS  . insulin aspart  0-9 Units Subcutaneous TID WC  . insulin aspart  3 Units Subcutaneous TID WC  .  insulin glargine  5 Units Subcutaneous QHS  . mupirocin ointment  1 application Nasal BID  . sodium chloride flush  3 mL Intravenous Q12H   Continuous Infusions: . sodium chloride 75 mL/hr at 11/05/15 0047    Principal Problem:   Sepsis (HCC) Active Problems:   UTI (urinary tract infection)   Sinus tachycardia (HCC)   Dehydration   Leukocytosis   DM type 2 causing eye disease, not at goal [facility](HCC)   LOS: 3 days    Time spent 20 minutes   By signing my name below, I, Zadie CleverlyJessica Augustus attest that this documentation has been  prepared under the direction and in the presence of Brendia Sacksaniel Goodrich, MD Electronically signed: Zadie CleverlyJessica Augustus  11/05/2015 12:25pm   I personally performed the services described in this documentation. All medical record entries made by the scribe were at my direction. I have reviewed the chart and agree that the record reflects my personal performance and is accurate and complete. Brendia Sacksaniel Goodrich, MD

## 2015-11-06 ENCOUNTER — Inpatient Hospital Stay (HOSPITAL_COMMUNITY): Payer: Medicare Other

## 2015-11-06 DIAGNOSIS — E860 Dehydration: Secondary | ICD-10-CM

## 2015-11-06 LAB — CBC
HCT: 43.3 % (ref 39.0–52.0)
HEMOGLOBIN: 14.2 g/dL (ref 13.0–17.0)
MCH: 30.3 pg (ref 26.0–34.0)
MCHC: 32.8 g/dL (ref 30.0–36.0)
MCV: 92.3 fL (ref 78.0–100.0)
PLATELETS: 188 10*3/uL (ref 150–400)
RBC: 4.69 MIL/uL (ref 4.22–5.81)
RDW: 13 % (ref 11.5–15.5)
WBC: 11.1 10*3/uL — AB (ref 4.0–10.5)

## 2015-11-06 LAB — GLUCOSE, CAPILLARY
GLUCOSE-CAPILLARY: 184 mg/dL — AB (ref 65–99)
GLUCOSE-CAPILLARY: 292 mg/dL — AB (ref 65–99)
GLUCOSE-CAPILLARY: 240 mg/dL — AB (ref 65–99)
Glucose-Capillary: 226 mg/dL — ABNORMAL HIGH (ref 65–99)
Glucose-Capillary: 251 mg/dL — ABNORMAL HIGH (ref 65–99)

## 2015-11-06 LAB — BASIC METABOLIC PANEL
ANION GAP: 5 (ref 5–15)
BUN: 16 mg/dL (ref 6–20)
CALCIUM: 7.7 mg/dL — AB (ref 8.9–10.3)
CO2: 27 mmol/L (ref 22–32)
CREATININE: 1.11 mg/dL (ref 0.61–1.24)
Chloride: 123 mmol/L — ABNORMAL HIGH (ref 101–111)
Glucose, Bld: 200 mg/dL — ABNORMAL HIGH (ref 65–99)
Potassium: 2.7 mmol/L — CL (ref 3.5–5.1)
SODIUM: 155 mmol/L — AB (ref 135–145)

## 2015-11-06 MED ORDER — POTASSIUM CHLORIDE 10 MEQ/100ML IV SOLN
10.0000 meq | INTRAVENOUS | Status: AC
  Administered 2015-11-06 (×4): 10 meq via INTRAVENOUS
  Filled 2015-11-06 (×4): qty 100

## 2015-11-06 MED ORDER — MAGNESIUM SULFATE 2 GM/50ML IV SOLN
2.0000 g | Freq: Once | INTRAVENOUS | Status: AC
  Administered 2015-11-06: 2 g via INTRAVENOUS
  Filled 2015-11-06: qty 50

## 2015-11-06 MED ORDER — DEXTROSE 5 % IV SOLN
INTRAVENOUS | Status: DC
  Administered 2015-11-06 – 2015-11-07 (×3): via INTRAVENOUS

## 2015-11-06 MED ORDER — LEVOTHYROXINE SODIUM 100 MCG IV SOLR
25.0000 ug | Freq: Every day | INTRAVENOUS | Status: DC
  Administered 2015-11-06 – 2015-11-07 (×2): 25 ug via INTRAVENOUS
  Filled 2015-11-06 (×5): qty 5

## 2015-11-06 MED ORDER — POTASSIUM CHLORIDE 10 MEQ/100ML IV SOLN
10.0000 meq | INTRAVENOUS | Status: AC
  Administered 2015-11-06 (×3): 10 meq via INTRAVENOUS
  Filled 2015-11-06: qty 100

## 2015-11-06 NOTE — Progress Notes (Signed)
PROGRESS NOTE  [name]:[id number] DOB: [birth year] DOA: 11/02/2015 PCP: Cassell Smiles., MD  Brief Narrative: 30 yom with history of mental retardation presented from SNF with complaints of acute encephalopathy. Source of infection found to be UTI and he has been referred for admission.  Assessment/Plan: 1. Sepsis secondary to UTI with associated acute encephalopathy. Hemodynamics remain stable and he is afebrile but mental status has not returned to baseline. Chest x-ray no acute disease. No hypoxia. 2. Presumed UTI although urine culture was unrevealing. Blood cultures no growth today. Has been hospitalized twice at [facility] last 6 months for UTI. 3. Diabetes mellitus type 2 on long-acting insulin. Blood sugars remain stable. 4. Urinary retention on admission. Continue Foley catheter. 5. Hypokalemia. Replace.  6. Hypernatremia. Secondary dehydration. 7. Mental retardation.    Although hemodynamics are stable and he is afebrile, mental status has not improved and his prognosis is guarded. We'll continue antibiotics, change fluids to address hypernatremia.  There is no other source of infection. Exam is very limited but there is no obvious focal deficits. There is no evidence of seizure activity. Per sister patient unable to tolerate MRI secondary to kyphosis. CT head without acute features. Plan EEG but seizure activity not suspected at this time.  Replete potassium.  Discussed with sister increased risk for infection with foley cath vs. overall benefits of keeping cath in place. Given significant urinary retention on admission will leave in place.  Began discussion with sister regard to lack of improvement, prognosis being guarded. We'll continue to address.    DVT prophylaxis: Lovenox Code Status: Full Family Communication: Sister, [name] (Legal guardian) at bedside Disposition Plan: Uncertain  Brendia Sacks, MD  [facility] Direct contact:  [phone] --Via amion app OR  --[email or web address]; password [email or web address]  7PM-7AM contact night coverage as above 11/06/2015, 7:37 AM  LOS: 4 days   Consultants:  None  Procedures:  None  Antimicrobials:  Rocephin 6/5>>  HPI/Subjective: Per sister, he has been able to eat and drink small amounts. He is still not responding at his baseline.  Unable to obtain HPI from patient. Does not respond to voice.   Objective: Filed Vitals:   11/05/15 0646 11/05/15 1300 11/05/15 1411 11/06/15 0430  BP: 144/82  156/87 171/89  Pulse: 101  94 117  Temp: 100 F (37.8 C) 99.5 F (37.5 C) 99 F (37.2 C) 100 F (37.8 C)  TempSrc: Axillary Axillary Axillary Axillary  Resp:   18   Height:      Weight:      SpO2:   100% 98%    Intake/Output Summary (Last 24 hours) at 11/06/15 0737 Last data filed at 11/06/15 0500  Gross per 24 hour  Intake 2921.33 ml  Output    850 ml  Net 2071.33 ml     Filed Weights   11/02/15 0837 11/02/15 1525  Weight: 77.111 kg (170 lb) 77.111 kg (170 lb)    Exam: Constitutional:  . Appears calm and comfortable. Lying in bed. Does not respond to voice. He appears ill but not toxic. Respiratory:  . CTA bilaterally, no w/r/r.  . Respiratory effort normal. No retractions or accessory muscle use Cardiovascular:  . RRR, no m/r/g . No LE extremity edema   Abdomen:  . Abdomen appears normal; no tenderness or masses . Foley cath in place.  Neurologic:  . Can not assess Psychiatric:  o Does not follow commands. o Cannot assess  I have personally reviewed following labs and imaging studies:  CBG 340  Sodium 155  Potassium 2.7  WBC, improved, 11.1  Scheduled Meds: . cefTRIAXone (ROCEPHIN)  IV  1 g Intravenous Q24H  . Chlorhexidine Gluconate Cloth  6 each Topical Q0600  . enoxaparin (LOVENOX) injection  40 mg Subcutaneous Q24H  . insulin aspart  0-5 Units Subcutaneous QHS  . insulin aspart  0-9 Units Subcutaneous TID WC  . insulin aspart  3 Units  Subcutaneous TID WC  . insulin glargine  5 Units Subcutaneous QHS  . mupirocin ointment  1 application Nasal BID  . polyvinyl alcohol  1 drop Both Eyes Q4H  . sodium chloride flush  3 mL Intravenous Q12H   Continuous Infusions: . sodium chloride 125 mL/hr at 11/06/15 0011    Principal Problem:   Sepsis (HCC) Active Problems:   UTI (urinary tract infection)   Sinus tachycardia (HCC)   Dehydration   Leukocytosis   DM type 2 causing eye disease, not at goal [facility](HCC)   LOS: 4 days    Time spent 20 minutes  By signing my name below, I, Zadie CleverlyJessica Augustus attest that this documentation has been prepared under the direction and in the presence of Brendia Sacksaniel Goodrich, MD Electronically signed: Zadie CleverlyJessica Augustus  11/06/2015 10:13am   I personally performed the services described in this documentation. All medical record entries made by the scribe were at my direction. I have reviewed the chart and agree that the record reflects my personal performance and is accurate and complete. Brendia Sacksaniel Goodrich, MD

## 2015-11-06 NOTE — Progress Notes (Signed)
Results for Joanne GavelMURRELL, Romond R (MRN [id number]) as of 11/06/2015 13:41  Ref. Range 11/05/2015 16:42 11/05/2015 21:27 11/06/2015 08:08 11/06/2015 12:06 11/06/2015 13:28  Glucose-Capillary Latest Ref Range: 65-99 mg/dL 409198 (H) 811164 (H) 914184 (H) 226 (H) 292 (H)  If CBGs continue to be greater than 200 mg/dl, consider increasing Lantus to 10 units daily and continuing Novolog SENSITIVE correction scale TID & HS and Novolog 3 units TID. Smith MinceKendra Gabreil [place] RN BSN CDE

## 2015-11-06 NOTE — Evaluation (Signed)
Clinical/Bedside Swallow Evaluation Patient Details  Name: [name] MRN: [id number] Date of Birth: 11-27-1955  Today's Date: 11/06/2015 Time: SLP Start Time (ACUTE ONLY): 1700 SLP Stop Time (ACUTE ONLY): 1753 SLP Time Calculation (min) (ACUTE ONLY): 53 min  Past Medical History:  Past Medical History  Diagnosis Date  . MR (mental retardation)   . Hypertension   . UTI (urinary tract infection)   . Diabetes mellitus   . DKA (diabetic ketoacidoses) (HCC)   . Gram-positive bacteremia   . Hypernatremia   . Sepsis(995.91)   . Renal disorder   . Hypokalemia    Past Surgical History: History reviewed. No pertinent past surgical history. HPI:  [name] is a 60 yo male with developmental cognitive impairment who was admitted with UTI from [facility]. MD requested bedside swallow evaluation due to witnessed coughing and gagging during meal earlier today/yesterday (RD present). Sister is at bedside during evaluation and she reports that at baseline, he occasionally coughs during meals. She denies history of PNA. Head CT negative for acute changes; MRI planned due to altered mental status since Saturday.   Assessment / Plan / Recommendation Clinical Impression  [name] presents with moderate oral phase dysphagia and suspected pharyngeal phase dysphagia in setting of acute mental status changes and developmental cognitive impairment characterized by decreased lingual manipulation of bolus, decreased mastication, decreased anterior posterior transit, and suspected delay in swallow initiation and suspected decreased pharyngeal pressure resulting in oral stasis and holding and occasional immediate coughing after thin liquids. His sister was hesitant to have his diet changed to thickened liquids, however SLP provided education regarding current risks for aspiration given his mental status changes and overt signs of aspiration. She was agreeable to texture and consistency modification of  D1/puree and NTL with 1:1 feeder assist. SLP will follow while in acute setting.     Aspiration Risk  Moderate aspiration risk    Diet Recommendation Dysphagia 1 (Puree);Nectar-thick liquid   Liquid Administration via: Cup Medication Administration: Whole meds with puree Supervision: Full supervision/cueing for compensatory strategies;Staff to assist with self feeding Compensations: Minimize environmental distractions;Slow rate;Small sips/bites Postural Changes: Seated upright at 90 degrees;Remain upright for at least 30 minutes after po intake    Other  Recommendations Oral Care Recommendations: Oral care BID;Staff/trained caregiver to provide oral care Other Recommendations: Order thickener from pharmacy;Prohibited food (jello, ice cream, thin soups);Clarify dietary restrictions   Follow up Recommendations  Skilled Nursing facility    Frequency and Duration min 2x/week  2 weeks       Prognosis Prognosis for Safe Diet Advancement: Fair Barriers to Reach Goals: Cognitive deficits      Swallow Study   General Date of Onset: 11/02/15 HPI: [name] is a 60 yo male with developmental cognitive impairment who was admitted with UTI from [facility]. MD requested bedside swallow evaluation due to witnessed coughing and gagging during meal earlier today/yesterday (RD present). Sister is at bedside during evaluation and she reports that at baseline, he occasionally coughs during meals. She denies history of PNA. Head CT negative for acute changes; MRI planned due to altered mental status since Saturday. Type of Study: Bedside Swallow Evaluation Previous Swallow Assessment: N/A Diet Prior to this Study: Dysphagia 3 (soft);Thin liquids Temperature Spikes Noted: No Respiratory Status: Room air History of Recent Intubation: No Behavior/Cognition: Alert;Cooperative;Requires cueing Oral Cavity Assessment: Within Functional Limits;Dry (Pt did not open mouth upon request) Oral Care  Completed by SLP: No Oral Cavity - Dentition: Missing  dentition Vision: Functional for self-feeding Self-Feeding Abilities: Total assist Patient Positioning: Upright in bed Baseline Vocal Quality: Normal Volitional Cough: Strong Volitional Swallow: Able to elicit (audible)    Oral/Motor/Sensory Function Overall Oral Motor/Sensory Function: Within functional limits   Ice Chips Ice chips: Within functional limits Presentation: Spoon   Thin Liquid Thin Liquid: Impaired Presentation: Straw;Cup Oral Phase Impairments: Reduced labial seal Pharyngeal  Phase Impairments: Suspected delayed Swallow;Multiple swallows;Cough - Immediate;Cough - Delayed (audible swallow)    Nectar Thick Nectar Thick Liquid: Impaired Presentation: Cup Pharyngeal Phase Impairments: Suspected delayed Swallow;Cough - Delayed   Honey Thick Honey Thick Liquid: Not tested   Puree Puree: Within functional limits Presentation: Spoon   Solid      Solid: Impaired Presentation: Spoon Oral Phase Impairments: Reduced lingual movement/coordination;Impaired mastication Oral Phase Functional Implications: Prolonged oral transit;Impaired mastication;Oral holding;Oral residue       Thank you,  Havery Moros, CCC-SLP [phone]  Nakiesha Rumsey 11/06/2015,6:27 PM

## 2015-11-07 ENCOUNTER — Inpatient Hospital Stay (HOSPITAL_COMMUNITY): Payer: Medicare Other

## 2015-11-07 ENCOUNTER — Inpatient Hospital Stay (HOSPITAL_COMMUNITY)
Admit: 2015-11-07 | Discharge: 2015-11-07 | Disposition: A | Discharge status: Home / Self Care | Payer: Medicare Other | Attending: Family Medicine | Admitting: Family Medicine

## 2015-11-07 LAB — BASIC METABOLIC PANEL
ANION GAP: 4 — AB (ref 5–15)
BUN: 13 mg/dL (ref 6–20)
CALCIUM: 7.3 mg/dL — AB (ref 8.9–10.3)
CO2: 27 mmol/L (ref 22–32)
CREATININE: 0.93 mg/dL (ref 0.61–1.24)
Chloride: 116 mmol/L — ABNORMAL HIGH (ref 101–111)
GFR calc non Af Amer: >60 mL/min (ref >60)
Glucose, Bld: 298 mg/dL — ABNORMAL HIGH (ref 65–99)
Potassium: 2.9 mmol/L — ABNORMAL LOW (ref 3.5–5.1)
SODIUM: 147 mmol/L — AB (ref 135–145)

## 2015-11-07 LAB — GLUCOSE, CAPILLARY
GLUCOSE-CAPILLARY: 204 mg/dL — AB (ref 65–99)
Glucose-Capillary: 297 mg/dL — ABNORMAL HIGH (ref 65–99)
Glucose-Capillary: 300 mg/dL — ABNORMAL HIGH (ref 65–99)
Glucose-Capillary: 320 mg/dL — ABNORMAL HIGH (ref 65–99)

## 2015-11-07 LAB — MAGNESIUM: MAGNESIUM: 2.5 mg/dL — AB (ref 1.7–2.4)

## 2015-11-07 LAB — PHOSPHORUS: Phosphorus: 1.9 mg/dL — ABNORMAL LOW (ref 2.5–4.6)

## 2015-11-07 MED ORDER — POTASSIUM PHOSPHATES 15 MMOLE/5ML IV SOLN
30.0000 mmol | Freq: Once | INTRAVENOUS | Status: AC
  Administered 2015-11-07: 30 mmol via INTRAVENOUS
  Filled 2015-11-07: qty 10

## 2015-11-07 MED ORDER — STARCH (THICKENING) PO POWD
ORAL | Status: DC | PRN
  Filled 2015-11-07: qty 227

## 2015-11-07 MED ORDER — POTASSIUM CHLORIDE 10 MEQ/100ML IV SOLN
10.0000 meq | INTRAVENOUS | Status: AC
  Administered 2015-11-07 (×4): 10 meq via INTRAVENOUS
  Filled 2015-11-07: qty 100

## 2015-11-07 MED ORDER — INSULIN GLARGINE 100 UNIT/ML ~~LOC~~ SOLN
10.0000 [IU] | Freq: Every day | SUBCUTANEOUS | Status: DC
  Administered 2015-11-07 – 2015-11-10 (×4): 10 [IU] via SUBCUTANEOUS
  Filled 2015-11-07 (×5): qty 0.1

## 2015-11-07 MED ORDER — SODIUM CHLORIDE 0.9 % IV SOLN
INTRAVENOUS | Status: DC
  Administered 2015-11-07 – 2015-11-08 (×2): via INTRAVENOUS

## 2015-11-07 MED ORDER — LEVOTHYROXINE SODIUM 50 MCG PO TABS
50.0000 ug | ORAL_TABLET | Freq: Every day | ORAL | Status: DC
  Administered 2015-11-08 – 2015-11-11 (×4): 50 ug via ORAL
  Filled 2015-11-07 (×5): qty 1

## 2015-11-07 NOTE — Progress Notes (Signed)
EEG Completed; Results Pending  

## 2015-11-07 NOTE — Clinical Social Work Note (Addendum)
[facility] Per MD, pt will be in hospital through weekend. Will discuss further on Monday.   Derenda FennelKara Takeyla Million, LCSW [phone]

## 2015-11-07 NOTE — Progress Notes (Signed)
PROGRESS NOTE  [name] EXB:[id number] DOB: [birth year] DOA: 11/02/2015 PCP: Jacob Smiles., MD  Brief Narrative: 10 yom with history of mental retardation presented from SNF with complaints of acute encephalopathy. Source of infection found to be UTI and he has been referred for admission.  Assessment/Plan: 1. Sepsis secondary to UTI with associated acute encephalopathy. Hemodynamics remain stable and he is afebrile. Mental status has improved today although per sister he not at baseline. Chest x-ray no acute disease. No hypoxia. Abdominal x-ray unremarkable. 2. Presumed UTI although urine culture was unrevealing. Blood cultures no growth. Has been hospitalized twice at [facility] last 6 months for UTI. 3. Diabetes mellitus type 2 on long-acting insulin. Hyperglycemia noted.  4. Urinary retention on admission. Continue Foley catheter. Discussed with sister who agrees. 5. Hypokalemia. Replace.  6. Hypernatremia. Secondary dehydration. 7. Mental retardation.    More alert today. Responds to voice and is drinking liquids without difficulty.  Continue abx and IVF.   Check BMP in a.m.  Continue to encourage PO intake.  DVT prophylaxis: Lovenox Code Status: Full Family Communication: Sister, [name] (Legal guardian) at bedside Disposition Plan: Return to assisted living, possibly 2 days  Jacob Sacks, MD  [facility] Direct contact: [phone] --Via amion app OR  --[email or web address]; password [email or web address]  7PM-7AM contact night coverage as above 11/07/2015, 8:02 AM  LOS: 5 days   Consultants:  ST- Dysphagia 1-Nectar-thick liquid.  Liquid Administration via: Cup Medication Administration: Whole meds with puree Supervision: Full supervision/cueing for compensatory strategies;Staff to assist with self feeding Compensations: Minimize environmental distractions;Slow rate;Small sips/bites Postural Changes: Seated upright at 90 degrees;Remain upright for at least 30 minutes after  po intake   Procedures:  None  Antimicrobials:  Rocephin 6/5>>  HPI/Subjective: Responds to voice. Abdominal pain?  Somewhat more awake and interactive persisted. Drinking very well. Still not interested in eating. Mildly improved.  Objective: Filed Vitals:   11/06/15 0430 11/06/15 1700 11/06/15 2130 11/07/15 0434  BP: 171/89 155/97 151/97 143/86  Pulse: 117 102 101 96  Temp: 100 F (37.8 C) 99.2 F (37.3 C) 97.5 F (36.4 C) 98.4 F (36.9 C)  TempSrc: Axillary Oral Oral Oral  Resp:  16 18 20   Height:      Weight:      SpO2: 98% 99% 100% 98%    Intake/Output Summary (Last 24 hours) at 11/07/15 0802 Last data filed at 11/07/15 0631  Gross per 24 hour  Intake 3272.5 ml  Output   2550 ml  Net  722.5 ml     Filed Weights   11/02/15 0837 11/02/15 1525  Weight: 77.111 kg (170 lb) 77.111 kg (170 lb)    Exam: Constitutional:  . Appears calm and comfortable. Lying in bed. Awake and alert. Responds to voice. "I'm okay" Respiratory:  . CTA bilaterally, no w/r/r.  . Respiratory effort normal. No retractions or accessory muscle use Cardiovascular:  . RRR, no m/r/g . No LE extremity edema   Abdomen:  . Abdomen appears normal . Perhaps mild generalized tenderness. . Foley cath in place.  Psychiatric:  o More awake but does not follow commands  I have personally reviewed following labs and imaging studies:  Blood sugars high  Sodium, improving 147  Potassium, improving 2.9  Scheduled Meds: . cefTRIAXone (ROCEPHIN)  IV  1 g Intravenous Q24H  . enoxaparin (LOVENOX) injection  40 mg Subcutaneous Q24H  . insulin aspart  0-5 Units Subcutaneous QHS  . insulin aspart  0-9 Units Subcutaneous TID WC  . insulin  aspart  3 Units Subcutaneous TID WC  . insulin glargine  5 Units Subcutaneous QHS  . levothyroxine  25 mcg Intravenous QAC breakfast  . mupirocin ointment  1 application Nasal BID  . polyvinyl alcohol  1 drop Both Eyes Q4H  . sodium chloride flush  3 mL  Intravenous Q12H   Continuous Infusions: . dextrose 150 mL/hr at 11/07/15 7253    Principal Problem:   Sepsis (HCC) Active Problems:   UTI (urinary tract infection)   Sinus tachycardia (HCC)   Dehydration   Leukocytosis   DM type 2 causing eye disease, not at goal [facility])   LOS: 5 days    Time spent 15 minutes  By signing my name below, I, [name] attest that this documentation has been prepared under the direction and in the presence of Jacob Sacks, MD Electronically signed: Zadie [name]  11/07/2015 1:13pm  I personally performed the services described in this documentation. All medical record entries made by the scribe were at my direction. I have reviewed the chart and agree that the record reflects my personal performance and is accurate and complete. Jacob Sacks, MD

## 2015-11-07 NOTE — Care Management Important Message (Signed)
Important Message  Patient Details  Name: Jacob GavelGene R [name] MRN: [id number] Date of Birth: November 13, 1955   Medicare Important Message Given:  Yes    Malcolm MetroChildress, Somaly Marteney Demske, RN 11/07/2015, 10:31 AM

## 2015-11-07 NOTE — Progress Notes (Signed)
Speech Language Pathology Treatment: Dysphagia  Patient Details Name: Jacob GavelGene R [name] MRN: [id number] DOB: 08/20/1955 Today's Date: 11/07/2015 Time: [id number] SLP Time Calculation (min) (ACUTE ONLY): 16 min  Assessment / Plan / Recommendation Clinical Impression  Pt was provided skilled dysphagia therapy targeting trials of thin liquids to assess tolerance for diet upgrade. Pt consumed thin liquids with a consistently audible swallow with uncoordinated oral stage with suspected delayed swallow trigger and wet vocal quality; pt demonstrating one immediate and overt coughing episode with thin liquids. Pt tolerated trials of nectar thick liquids with improved tolerance as evidenced by decreased coughing and no wet vocal quality noted. Recommend continue with D1/puree and Nectar thick liquids and recommend continue 1:1 support/feeding during meals until mentation improves. Continue to follow to assess diet tolerance and for continued education to family and patient.   HPI HPI: [name] is a 60 yo male with developmental cognitive impairment who [facility] MD requested bedside swallow evaluation due to witnessed coughing and gagging during meal earlier today/yesterday (RD present). Sister is at bedside during evaluation and she reports that at baseline, he occasionally coughs during meals. She denies history of PNA. Head CT negative for acute changes; MRI planned due to altered mental status since Saturday.      SLP Plan  Continue with current plan of care     Recommendations  Diet recommendations: Dysphagia 1 (puree);Nectar-thick liquid Liquids provided via: Cup Medication Administration: Whole meds with puree Supervision: Full supervision/cueing for compensatory strategies;Staff to assist with self feeding Compensations: Minimize environmental distractions;Slow rate;Small sips/bites;Follow solids with liquid Postural Changes and/or Swallow Maneuvers: Seated  upright 90 degrees            Oral Care Recommendations: Oral care BID;Staff/trained caregiver to provide oral care Follow up Recommendations: Skilled Nursing facility Plan: Continue with current plan of care     Amelia H. Romie LeveeYarbrough MA, CCC-SLP Speech Language Pathologist   [name] 11/07/2015, 4:59 PM

## 2015-11-08 DIAGNOSIS — G9340 Encephalopathy, unspecified: Secondary | ICD-10-CM

## 2015-11-08 LAB — GLUCOSE, CAPILLARY
GLUCOSE-CAPILLARY: 168 mg/dL — AB (ref 65–99)
Glucose-Capillary: 196 mg/dL — ABNORMAL HIGH (ref 65–99)
Glucose-Capillary: 264 mg/dL — ABNORMAL HIGH (ref 65–99)
Glucose-Capillary: 200 mg/dL — ABNORMAL HIGH (ref 65–99)

## 2015-11-08 LAB — BASIC METABOLIC PANEL
Anion gap: 7 (ref 5–15)
BUN: 13 mg/dL (ref 6–20)
CHLORIDE: 112 mmol/L — AB (ref 101–111)
CO2: 24 mmol/L (ref 22–32)
CREATININE: 0.91 mg/dL (ref 0.61–1.24)
Calcium: 7.8 mg/dL — ABNORMAL LOW (ref 8.9–10.3)
GFR calc Af Amer: >60 mL/min (ref >60)
GFR calc non Af Amer: >60 mL/min (ref >60)
Glucose, Bld: 226 mg/dL — ABNORMAL HIGH (ref 65–99)
Potassium: 3.3 mmol/L — ABNORMAL LOW (ref 3.5–5.1)
Sodium: 143 mmol/L (ref 135–145)

## 2015-11-08 LAB — CULTURE, BLOOD (ROUTINE X 2)
CULTURE: NO GROWTH
CULTURE: NO GROWTH

## 2015-11-08 MED ORDER — POTASSIUM CHLORIDE 10 MEQ/100ML IV SOLN
10.0000 meq | INTRAVENOUS | Status: AC
  Administered 2015-11-08 (×4): 10 meq via INTRAVENOUS
  Filled 2015-11-08: qty 100

## 2015-11-08 MED ORDER — LABETALOL HCL 200 MG PO TABS
200.0000 mg | ORAL_TABLET | Freq: Two times a day (BID) | ORAL | Status: DC
  Administered 2015-11-08 – 2015-11-11 (×7): 200 mg via ORAL
  Filled 2015-11-08 (×8): qty 1

## 2015-11-08 NOTE — Progress Notes (Signed)
PROGRESS NOTE  Jacob GavelGene R [name] ZOX:[id number] DOB: 11/03/1955 DOA: 11/02/2015 PCP: Cassell SmilesFUSCO,LAWRENCE J., MD  Brief Narrative: [age] yom with history of mental retardation presented from SNF with complaints of acute encephalopathy. Source of infection found to be UTI and he has been referred for admission.  Assessment/Plan: 1. Sepsis secondary to UTI. Clinically resolved. 2. Presumed UTI although culture was unrevealing. Blood cultures no growth. Has a history of UTI and bladder abnormalities. Had urinary retention on admission. Foley catheter therefore continued. 3. Acute encephalopathy secondary to infection above. Gradually improving. More awake, alert and conversant. Poor appetite remains but drinking a lot of fluids. 4. Hypernatremia secondary to dehydration, resolved. 5. Hypokalemia, improving. 6. Mental retardation.   Overall continuing to improve, more alert.  Continue to encourage fluid intake. Stop IV fluids. Continue IV antibiotics given difficulty following commands.  Mobilize, PT consult  Likely return to assisted living facility next 48 hours  DVT prophylaxis: Lovenox Code Status: Full Family Communication: Sister, Steward DroneBrenda (Legal guardian) at bedside 6/10 Disposition Plan: Return to assisted living  Brendia Sacksaniel Goodrich, MD  [facility] Direct contact: [phone] --Via amion app OR  --[email or web address]; password [email or web address]  7PM-7AM contact night coverage as above 11/08/2015, 1:54 PM  LOS: 6 days   Consultants:  ST- Dysphagia 1-Nectar-thick liquid.  Liquid Administration via: Cup Medication Administration: Whole meds with puree Supervision: Full supervision/cueing for compensatory strategies;Staff to assist with self feeding Compensations: Minimize environmental distractions;Slow rate;Small sips/bites Postural Changes: Seated upright at 90 degrees;Remain upright for at least 30 minutes after po intake   Procedures:  None  Antimicrobials:  Rocephin  6/5>>  HPI/Subjective: Drinking very well per sister. Had a few bites of breakfast and a few bites of sweep potato for lunch. More awake, alert and conversant.  Objective: Filed Vitals:   11/07/15 0434 11/07/15 1500 11/07/15 2219 11/08/15 0658  BP: 143/86 145/90 155/90 151/86  Pulse: 96 89 91 87  Temp: 98.4 F (36.9 C) 98.9 F (37.2 C) 98.9 F (37.2 C) 98.2 F (36.8 C)  TempSrc: Oral Axillary Axillary Axillary  Resp: 20 18 18 18   Height:      Weight:      SpO2: 98% 100% 96% 98%    Intake/Output Summary (Last 24 hours) at 11/08/15 1354 Last data filed at 11/08/15 0600  Gross per 24 hour  Intake  772.5 ml  Output   1850 ml  Net -1077.5 ml     Filed Weights   11/02/15 0837 11/02/15 1525  Weight: 77.111 kg (170 lb) 77.111 kg (170 lb)    Exam: Constitutional:  . Appears calm and comfortable. Very awake today, answers simple questions. Appears much better today. Respiratory:  . CTA bilaterally, no w/r/r.  . Respiratory effort normal. No retractions or accessory muscle use Cardiovascular:  . RRR, no m/r/g . No LE extremity edema   Abdomen:  . Abdomen appears normal; no tenderness or masses Skin:  . Linear blisters over lateral left thigh.  I have personally reviewed following labs and imaging studies:  Blood sugars stable  Sodium is normalized. Potassium up to 3.3. Creatinine preserved.  Scheduled Meds: . cefTRIAXone (ROCEPHIN)  IV  1 g Intravenous Q24H  . enoxaparin (LOVENOX) injection  40 mg Subcutaneous Q24H  . insulin aspart  0-5 Units Subcutaneous QHS  . insulin aspart  0-9 Units Subcutaneous TID WC  . insulin aspart  3 Units Subcutaneous TID WC  . insulin glargine  10 Units Subcutaneous QHS  . labetalol  200 mg Oral BID  .  levothyroxine  50 mcg Oral QAC breakfast  . polyvinyl alcohol  1 drop Both Eyes Q4H  . potassium chloride  10 mEq Intravenous Q1 Hr x 4  . sodium chloride flush  3 mL Intravenous Q12H   Continuous Infusions:    Principal  Problem:   Sepsis (HCC) Active Problems:   UTI (urinary tract infection)   Sinus tachycardia (HCC)   Dehydration   Leukocytosis   DM type 2 causing eye disease, not at goal [facility])   LOS: 6 days    Time spent 15 minutes  By signing my name below, I, Zadie Cleverly attest that this documentation has been prepared under the direction and in the presence of Brendia Sacks, MD Electronically signed: Zadie Cleverly  11/08/2015 1:13pm  I personally performed the services described in this documentation. All medical record entries made by the scribe were at my direction. I have reviewed the chart and agree that the record reflects my personal performance and is accurate and complete. Brendia Sacks, MD

## 2015-11-09 DIAGNOSIS — R1310 Dysphagia, unspecified: Secondary | ICD-10-CM

## 2015-11-09 LAB — GLUCOSE, CAPILLARY
GLUCOSE-CAPILLARY: 146 mg/dL — AB (ref 65–99)
Glucose-Capillary: 209 mg/dL — ABNORMAL HIGH (ref 65–99)
Glucose-Capillary: 225 mg/dL — ABNORMAL HIGH (ref 65–99)
Glucose-Capillary: 198 mg/dL — ABNORMAL HIGH (ref 65–99)

## 2015-11-09 LAB — BASIC METABOLIC PANEL
ANION GAP: 3 — AB (ref 5–15)
BUN: 13 mg/dL (ref 6–20)
CALCIUM: 7.8 mg/dL — AB (ref 8.9–10.3)
CHLORIDE: 117 mmol/L — AB (ref 101–111)
CO2: 26 mmol/L (ref 22–32)
CREATININE: 0.83 mg/dL (ref 0.61–1.24)
GFR calc Af Amer: >60 mL/min (ref >60)
GFR calc non Af Amer: >60 mL/min (ref >60)
GLUCOSE: 154 mg/dL — AB (ref 65–99)
Potassium: 3.7 mmol/L (ref 3.5–5.1)
Sodium: 146 mmol/L — ABNORMAL HIGH (ref 135–145)

## 2015-11-09 LAB — MAGNESIUM: MAGNESIUM: 2.2 mg/dL (ref 1.7–2.4)

## 2015-11-09 LAB — PHOSPHORUS: Phosphorus: 2.9 mg/dL (ref 2.5–4.6)

## 2015-11-09 MED ORDER — CEFUROXIME AXETIL 250 MG PO TABS
500.0000 mg | ORAL_TABLET | Freq: Two times a day (BID) | ORAL | Status: DC
  Administered 2015-11-09: 500 mg via ORAL
  Filled 2015-11-09 (×2): qty 2

## 2015-11-09 NOTE — Progress Notes (Signed)
PROGRESS NOTE  [name] ZOX:[id number] DOB: Aug 17, 1955 DOA: 11/02/2015 PCP: Jacob Smiles., MD  Brief Narrative: 4 yom with history of mental retardation presented from SNF with complaints of acute encephalopathy. Source of infection found to be UTI and he has been referred for admission.  Assessment/Plan: 1. Sepsis secondary to UTI. Resolved. 2. Presumed UTI although culture revealed no growth. Continues to improve. Has a history of UTI and bladder abnormalities. Had urinary retention on admission. Foley catheter therefore continued. 3. Acute encephalopathy secondary to infection above. Now more rapidly improving. More awake, alert and conversant. Appetite improving. 4. Dysphagia. Speech therapy recommendations. Patient not eating as will not eat. Food, Sr. requests advancing diet dysphagia 2, the family understands that this places him at increased risk for aspiration, risk is accepted by family. T 5. Hypokalemia, resolved. 6. Mental retardation.   Overall continuing to improve, more alert and conversant.  Continue to encourage oral fluid intake. Will transition to oral abx today.   Mobilize, PT consult  Likely return to assisted living facility next 24 hours  DVT prophylaxis: Lovenox Code Status: Full Family Communication: Sister, [name] (Legal guardian) husband at bedside.  Disposition Plan: Return to assisted living when improved.  Jacob Sacks, MD  [facility] Direct contact: [phone] --Via amion app OR  --[email or web address]; password [email or web address]  7PM-7AM contact night coverage as above 11/09/2015, 6:47 AM  LOS: 7 days   Consultants:  ST- Dysphagia 1-Nectar-thick liquid.  Liquid Administration via: Cup Medication Administration: Whole meds with puree Supervision: Full supervision/cueing for compensatory strategies;Staff to assist with self feeding Compensations: Minimize environmental distractions;Slow rate;Small sips/bites Postural Changes: Seated  upright at 90 degrees;Remain upright for at least 30 minutes after po intake   Procedures:  None  Antimicrobials:  Rocephin 6/5>>6/11  Ceftin 6/11>>  HPI/Subjective: Per brother-in-law at bedside, he appears to be doing better. Drank orange juice this morning as well as oatmeal. More awake, speech clearer. Improving. Sister requests oatmeal for breakfast, advanced to dysphagia 2 diet because patient will not eat pured food.  Objective: Filed Vitals:   11/08/15 0658 11/08/15 1404 11/08/15 2122 11/09/15 0458  BP: 151/86 90/62 122/61 124/71  Pulse: 87 87 76 75  Temp: 98.2 F (36.8 C) 98.4 F (36.9 C) 98.7 F (37.1 C) 98.1 F (36.7 C)  TempSrc: Axillary  Axillary Oral  Resp: Height:      Weight:      SpO2: 98% 97% 100% 100%    Intake/Output Summary (Last 24 hours) at 11/09/15 0647 Last data filed at 11/08/15 1900  Gross per 24 hour  Intake    680 ml  Output    650 ml  Net     30 ml     Filed Weights   11/02/15 0837 11/02/15 1525  Weight: 77.111 kg (170 lb) 77.111 kg (170 lb)    Exam: Constitutional:  . Appears calm and comfortable. Very alert today, participates in conversation. Respiratory:  . CTA bilaterally, no w/r/r.  . Respiratory effort normal. No retractions or accessory muscle use Cardiovascular:  . RRR, no m/r/g . No LE extremity edema    I have personally reviewed following labs and imaging studies:  BMP unremarkable.  Blood sugars stable.   Scheduled Meds: . cefTRIAXone (ROCEPHIN)  IV  1 g Intravenous Q24H  . enoxaparin (LOVENOX) injection  40 mg Subcutaneous Q24H  . insulin aspart  0-5 Units Subcutaneous QHS  . insulin aspart  0-9 Units Subcutaneous TID WC  . insulin aspart  3 Units Subcutaneous TID WC  . insulin glargine  10 Units Subcutaneous QHS  . labetalol  200 mg Oral BID  . levothyroxine  50 mcg Oral QAC breakfast  . polyvinyl alcohol  1 drop Both Eyes Q4H  . sodium chloride flush  3 mL Intravenous Q12H    Continuous Infusions:    Principal Problem:   Sepsis (HCC) Active Problems:   UTI (urinary tract infection)   Sinus tachycardia (HCC)   Dehydration   Leukocytosis   DM type 2 causing eye disease, not at goal [facility](HCC)   LOS: 7 days    Time spent 15 minutes  By signing my name below, I, [name] attest that this documentation has been prepared under the direction and in the presence of Jacob Sacksaniel Goodrich, MD Electronically signed: Burnett HarryJennifer [name], Scribe.  11/09/2015 10:15am   I personally performed the services described in this documentation. All medical record entries made by the scribe were at my direction. I have reviewed the chart and agree that the record reflects my personal performance and is accurate and complete. Jacob Sacksaniel Goodrich, MD

## 2015-11-10 LAB — GLUCOSE, CAPILLARY
GLUCOSE-CAPILLARY: 177 mg/dL — AB (ref 65–99)
GLUCOSE-CAPILLARY: 234 mg/dL — AB (ref 65–99)
GLUCOSE-CAPILLARY: 235 mg/dL — AB (ref 65–99)
Glucose-Capillary: 167 mg/dL — ABNORMAL HIGH (ref 65–99)

## 2015-11-10 MED ORDER — CEFIXIME 100 MG/5ML PO SUSR
400.0000 mg | Freq: Every day | ORAL | Status: DC
  Administered 2015-11-10 – 2015-11-11 (×2): 400 mg via ORAL
  Filled 2015-11-10 (×3): qty 20

## 2015-11-10 NOTE — Progress Notes (Signed)
Physical Therapy Treatment Patient Details Name: [name] MRN: [id number] DOB: 1955-12-21 Today's Date: 11/10/2015    History of Present Illness 60 yo M admitted from his ALF ([facility]) on 11/02/2015 due to confusion and found to have sepsis due to UTI. PMH: MR, HTN, UTI, DM, DKA, Gram positive bacteremia, Hypernatremia, sepsis, renal disorder, hypokalemia.     PT Comments    PT notified by Dr. Irene Limbo that pt's sister was present in the room.  On eval, Brother in law had stated that he may do better when she is there, therefore PT returned to further assess functional mobility.  Pt improved slightly, only required Mod A for bed mobility, but continues to require Max A for sit<>stand trails, and not able to fully lift buttocks off the EOB.  Continue to recommend SNF at this time.   Follow Up Recommendations  SNF;Supervision/Assistance - 24 hour     Equipment Recommendations       Recommendations for Other Services       Precautions / Restrictions Restrictions Weight Bearing Restrictions: No    Mobility  Bed Mobility Overal bed mobility: Needs Assistance Bed Mobility: Supine to Sit;Sit to Supine     Supine to sit: Max assist;HOB elevated Sit to supine: Total assist;Mod assist   General bed mobility comments: Pt only required Mod A for supine<>sit with sister present and encouraging.    Transfers Overall transfer level: Needs assistance Equipment used: 2 person hand held assist Transfers: Sit to/from Stand Sit to Stand: Max assist;+2 physical assistance         General transfer comment: Sister assisting, and sit<>stand attempted multiple times.  Pt able to lift buttocks ~3-4 inches off the bed with assistance, but then starts stating "take it easy" and sits back down on the EOB.    Ambulation/Gait                 Stairs            Wheelchair Mobility    Modified Rankin (Stroke Patients Only)       Balance Overall balance assessment:  Needs assistance Sitting-balance support: Bilateral upper extremity supported Sitting balance-Leahy Scale: Fair Sitting balance - Comments: posterior lean   Standing balance support: Bilateral upper extremity supported Standing balance-Leahy Scale: Fair                      Cognition Arousal/Alertness: Awake/alert Behavior During Therapy: WFL for tasks assessed/performed;Agitated (Can get agitated at times - shaking his hand at his siter and therapist. ) Overall Cognitive Status: History of cognitive impairments - at baseline (History of MR)       Memory: Decreased short-term memory;Decreased recall of precautions              Exercises      General Comments General comments (skin integrity, edema, etc.): R heel bandage noted - heel floated at end of tx.       Pertinent Vitals/Pain Pain Assessment: No/denies pain (But during mobility he states "Hey! take it easy, take it easy" )    Home Living                 Additional Comments: Pt lives in [place] house with his mother who has Alzheimers Dementia.     Prior Function Level of Independence: Needs assistance  Gait / Transfers Assistance Needed: Pt ambulates without DME, and sister states that he is independent with dressing ADL's / Homemaking Assistance Needed: Per chart  review, he requires extensive assistance with toileting and bathing.      PT Goals (current goals can now be found in the care plan section) Acute Rehab PT Goals Patient Stated Goal: To improve strength PT Goal Formulation: With family Time For Goal Achievement: 11/24/15 Potential to Achieve Goals: Fair    Frequency  Min 3X/week    PT Plan      Co-evaluation             End of Session Equipment Utilized During Treatment: Gait belt Activity Tolerance: Patient tolerated treatment well Patient left: in bed;with family/visitor present;with call bell/phone within reach     Time: 1100-1127 PT Time Calculation (min) (ACUTE  ONLY): 27 min  Charges:  $Therapeutic Activity: 23-37 mins                    G Codes:  Functional Assessment Tool Used: [employer] "6-clicks"  Functional Limitation: Mobility: Walking and moving around Mobility: Walking and Moving Around Current Status [phone]): At least 60 percent but less than 80 percent impaired, limited or restricted Mobility: Walking and Moving Around Goal Status [phone]): At least 40 percent but less than 60 percent impaired, limited or restricted   Beth Danner Paulding, PT, DPT X: [phone]

## 2015-11-10 NOTE — Progress Notes (Signed)
PROGRESS NOTE  [name] UJW:[id number] DOB: May 26, 1956 DOA: 11/02/2015 PCP: Cassell Smiles., MD  Brief Narrative: 53 yom with history of mental retardation presented from SNF with complaints of acute encephalopathy. Source of infection found to be UTI and he has been referred for admission.  Assessment/Plan: 1. Sepsis secondary to UTI. Resolved. 2. Presumed UTI although culture revealed no growth. Appears resolved. Has a history of UTI and bladder abnormalities. Had urinary retention on admission. Foley catheter therefore continued. 3. Acute encephalopathy secondary to infection above. Now baseline. 4. Dysphagia. Speech therapy recommendations noted. Patient not eating recommended diet. Sister requested advancing diet dysphagia 2, the family understands that this places him at increased risk for aspiration, risk is accepted by family.  5. Hypokalemia, resolved. 6. Mental retardation.   Now medically stable. Continue oral antibiotics.  Physical therapy is recommended short-term skilled rehabilitation. Social work consult.  DVT prophylaxis: Lovenox Code Status: Full Family Communication: Sister, [name] (Legal guardian) husband at bedside.  Disposition Plan: SNF  Brendia Sacks, MD  [facility] Direct contact: [phone] --Via amion app OR  --[email or web address]; password [email or web address]  7PM-7AM contact night coverage as above 11/10/2015, 7:42 AM  LOS: 8 days   Consultants:  ST- Dysphagia 1-Nectar-thick liquid.  Liquid Administration via: Cup Medication Administration: Whole meds with puree Supervision: Full supervision/cueing for compensatory strategies;Staff to assist with self feeding Compensations: Minimize environmental distractions;Slow rate;Small sips/bites Postural Changes: Seated upright at 90 degrees;Remain upright for at least 30 minutes after po intake   PT  Procedures:  None  Antimicrobials:  Rocephin 6/5>>6/11  Ceftin 6/11>>  HPI/Subjective: Feels  good. Able to eat today per brother-in-law.  Objective: Filed Vitals:   11/09/15 0458 11/09/15 1348 11/09/15 2111 11/10/15 0455  BP: 124/71 126/84 139/79 133/75  Pulse: 75 84 79 84  Temp: 98.1 F (36.7 C) 98 F (36.7 C) 97.7 F (36.5 C) 98 F (36.7 C)  TempSrc: Oral  Axillary Axillary  Resp: Height:      Weight:      SpO2: 100% 100% 99% 95%    Intake/Output Summary (Last 24 hours) at 11/10/15 0742 Last data filed at 11/10/15 0455  Gross per 24 hour  Intake    480 ml  Output   2651 ml  Net  -2171 ml     Filed Weights   11/02/15 0837 11/02/15 1525  Weight: 77.111 kg (170 lb) 77.111 kg (170 lb)    Exam: Constitutional:  . Appears calm and comfortable. Alert. Respiratory:  . CTA bilaterally, no w/r/r.  . Respiratory effort normal. No retractions or accessory muscle use Cardiovascular:  . RRR, no m/r/g Psychiatric. No significant change.  I have personally reviewed following labs and imaging studies:  Blood sugars stable.   Scheduled Meds: . cefUROXime  500 mg Oral BID WC  . enoxaparin (LOVENOX) injection  40 mg Subcutaneous Q24H  . insulin aspart  0-5 Units Subcutaneous QHS  . insulin aspart  0-9 Units Subcutaneous TID WC  . insulin aspart  3 Units Subcutaneous TID WC  . insulin glargine  10 Units Subcutaneous QHS  . labetalol  200 mg Oral BID  . levothyroxine  50 mcg Oral QAC breakfast  . polyvinyl alcohol  1 drop Both Eyes Q4H  . sodium chloride flush  3 mL Intravenous Q12H   Continuous Infusions:    Principal Problem:   Sepsis (HCC) Active Problems:   UTI (urinary tract infection)   Sinus tachycardia (HCC)   Dehydration   Leukocytosis  DM type 2 causing eye disease, not at goal [facility](HCC)   LOS: 8 days    By signing my name below, I, Zadie CleverlyJessica Augustus attest that this documentation has been prepared under the direction and in the presence of Brendia Sacksaniel Goodrich, MD Electronically signed: Zadie CleverlyJessica Augustus   11/10/2015 10:35am    I  personally performed the services described in this documentation. All medical record entries made by the scribe were at my direction. I have reviewed the chart and agree that the record reflects my personal performance and is accurate and complete. Brendia Sacksaniel Goodrich, MD

## 2015-11-10 NOTE — Evaluation (Signed)
Physical Therapy Evaluation Patient Details Name: [name] MRN: [id number] DOB: 1955-11-06 Today's Date: 11/10/2015   History of Present Illness  60 yo M admitted from his ALF ([facility]) on 11/02/2015 due to confusion and found to have sepsis due to UTI. PMH: MR, HTN, UTI, DM, DKA, Gram positive bacteremia, Hypernatremia, sepsis, renal disorder, hypokalemia.   Clinical Impression  Pt received in bed, brother in law present, and pt was agreeable to PT evaluation.  Pt normally lives at an ALF with his mother, but is independent with ambulation, and dressing himself.  He requires assistance for toileting, and bathing.  Today, pt required Max A for bed mobility, and Max A for sit<>stand trial.  Pt was not able to clear buttocks from bed during multiple sit<>stand trials despite having bed height raised.  At this point, he is recommended for SNF to progress with balance, strength, and endurance needed to return to ALF.     Follow Up Recommendations SNF;Supervision/Assistance - 24 hour    Equipment Recommendations       Recommendations for Other Services       Precautions / Restrictions Restrictions Weight Bearing Restrictions: No      Mobility  Bed Mobility Overal bed mobility: Needs Assistance Bed Mobility: Supine to Sit;Sit to Supine     Supine to sit: Max assist;HOB elevated Sit to supine: Total assist   General bed mobility comments: Pt requires assistance with HOB completely raised, and use of bed pad to assist hips to the EOB.  Once sitting on the EOB he requires assistance for static sitting balance due to poor posture, core stability and tendancy have LOB posteriorly.  Required Total A for supine scoot for positioning in the bed.   Transfers Overall transfer level: Needs assistance   Transfers: Sit to/from Stand Sit to Stand: Max assist         General transfer comment: Attempted multiple times, with bed height elevated, however pt not putting forth great  effort for sit<>stand trials.  Pt states he is ready to stand, but does not initiate the transfer despite cues and education.   Ambulation/Gait                Stairs            Wheelchair Mobility    Modified Rankin (Stroke Patients Only)       Balance Overall balance assessment: Needs assistance Sitting-balance support: Bilateral upper extremity supported Sitting balance-Leahy Scale: Fair Sitting balance - Comments: posterior lean                                     Pertinent Vitals/Pain Pain Assessment: No/denies pain (But during mobility he states "Hey! take it easy, take it easy" )    Home Living                   Additional Comments: Pt lives in [place] house with his mother who has Alzheimers Dementia.     Prior Function Level of Independence: Needs assistance   Gait / Transfers Assistance Needed: Pt ambulates without DME, and sister states that he is independent with dressing  ADL's / Homemaking Assistance Needed: Per chart review, he requires extensive assistance with toileting and bathing.         Hand Dominance        Extremity/Trunk Assessment   Upper Extremity Assessment: Generalized weakness  Lower Extremity Assessment: Generalized weakness         Communication      Cognition Arousal/Alertness: Awake/alert Behavior During Therapy: WFL for tasks assessed/performed;Agitated (Can get agitated at times - shaking his hand at his siter and therapist. ) Overall Cognitive Status: History of cognitive impairments - at baseline (History of MR)       Memory: Decreased short-term memory;Decreased recall of precautions              General Comments General comments (skin integrity, edema, etc.): R heel bandage noted - heel floated at end of tx.     Exercises        Assessment/Plan    PT Assessment Patient needs continued PT services  PT Diagnosis Difficulty walking;Abnormality of  gait;Generalized weakness   PT Problem List Decreased strength;Decreased activity tolerance;Decreased balance;Decreased mobility;Decreased knowledge of use of DME;Decreased safety awareness;Decreased knowledge of precautions;Pain;Decreased skin integrity  PT Treatment Interventions DME instruction;Gait training;Functional mobility training;Therapeutic activities;Therapeutic exercise;Balance training;Neuromuscular re-education;Patient/family education   PT Goals (Current goals can be found in the Care Plan section) Acute Rehab PT Goals Patient Stated Goal: To improve strength PT Goal Formulation: With family Time For Goal Achievement: 11/24/15 Potential to Achieve Goals: Fair    Frequency Min 3X/week   Barriers to discharge        Co-evaluation               End of Session Equipment Utilized During Treatment: Gait belt Activity Tolerance: Patient tolerated treatment well Patient left: in bed;with family/visitor present;with call bell/phone within reach Nurse Communication: Mobility status    Functional Assessment Tool Used: [employer] "6-clicks"  Functional Limitation: Mobility: Walking and moving around Mobility: Walking and Moving Around Current Status [phone]): At least 60 percent but less than 80 percent impaired, limited or restricted Mobility: Walking and Moving Around Goal Status [phone]): At least 40 percent but less than 60 percent impaired, limited or restricted    Time: 0950-1029 PT Time Calculation (min) (ACUTE ONLY): 39 min   Charges:   PT Evaluation $PT Eval Moderate Complexity: 1 Procedure PT Treatments $Therapeutic Activity: 23-37 mins   PT G Codes:   PT G-Codes **NOT FOR INPATIENT CLASS** Functional Assessment Tool Used: [employer] "6-clicks"  Functional Limitation: Mobility: Walking and moving around Mobility: Walking and Moving Around Current Status [phone]): At least 60 percent but less than 80 percent impaired, limited or  restricted Mobility: Walking and Moving Around Goal Status [phone]): At least 40 percent but less than 60 percent impaired, limited or restricted   Beth Landra Howze, PT, DPT X: [phone]

## 2015-11-10 NOTE — Care Management Note (Addendum)
Case Management Note  Patient Details  Name: Joanne GavelGene R Tvedt MRN: [id number] Date of Birth: Aug 26, 1955  Expected Discharge Date:  11/06/15               Expected Discharge Plan:  Skilled Nursing Facility  In-House Referral:  Clinical Social Work  Discharge planning Services  CM Consult  Post Acute Care Choice:  NA Choice offered to:  NA  DME Arranged:    DME Agency:     HH Arranged:    HH Agency:     Status of Service:  Completed, signed off  Medicare Important Message Given:  Yes Date Medicare IM Given:    Medicare IM give by:    Date Additional Medicare IM Given:    Additional Medicare Important Message give by:     If discussed at Long Length of Stay Meetings, dates discussed:    Additional Comments: PT has recommended SNF, family is agreeable. Sister at bedside. CSW is aware and will make arrangements for placement. No CM needs.   Malcolm Metrohildress, Tillie Viverette Demske, RN 11/10/2015, 3:19 PM

## 2015-11-10 NOTE — Care Management Important Message (Signed)
Important Message  Patient Details  Name: [name] MRN: [id number] Date of Birth: 1956/04/23   Medicare Important Message Given:  Yes    Malcolm MetroChildress, Kehinde Totzke Demske, RN 11/10/2015, 1:38 PM

## 2015-11-11 DIAGNOSIS — A4189 Other specified sepsis: Secondary | ICD-10-CM | POA: Diagnosis not present

## 2015-11-11 DIAGNOSIS — R339 Retention of urine, unspecified: Secondary | ICD-10-CM | POA: Diagnosis not present

## 2015-11-11 DIAGNOSIS — R05 Cough: Secondary | ICD-10-CM | POA: Diagnosis not present

## 2015-11-11 DIAGNOSIS — L603 Nail dystrophy: Secondary | ICD-10-CM | POA: Diagnosis not present

## 2015-11-11 DIAGNOSIS — M4000 Postural kyphosis, site unspecified: Secondary | ICD-10-CM | POA: Diagnosis not present

## 2015-11-11 DIAGNOSIS — R279 Unspecified lack of coordination: Secondary | ICD-10-CM | POA: Diagnosis not present

## 2015-11-11 DIAGNOSIS — M2550 Pain in unspecified joint: Secondary | ICD-10-CM | POA: Diagnosis not present

## 2015-11-11 DIAGNOSIS — E559 Vitamin D deficiency, unspecified: Secondary | ICD-10-CM | POA: Diagnosis not present

## 2015-11-11 DIAGNOSIS — N3000 Acute cystitis without hematuria: Secondary | ICD-10-CM | POA: Diagnosis not present

## 2015-11-11 DIAGNOSIS — R531 Weakness: Secondary | ICD-10-CM | POA: Diagnosis not present

## 2015-11-11 DIAGNOSIS — A419 Sepsis, unspecified organism: Secondary | ICD-10-CM | POA: Diagnosis not present

## 2015-11-11 DIAGNOSIS — D72829 Elevated white blood cell count, unspecified: Secondary | ICD-10-CM | POA: Diagnosis not present

## 2015-11-11 DIAGNOSIS — R1310 Dysphagia, unspecified: Secondary | ICD-10-CM | POA: Diagnosis not present

## 2015-11-11 DIAGNOSIS — N3020 Other chronic cystitis without hematuria: Secondary | ICD-10-CM | POA: Diagnosis not present

## 2015-11-11 DIAGNOSIS — G9341 Metabolic encephalopathy: Secondary | ICD-10-CM | POA: Diagnosis not present

## 2015-11-11 DIAGNOSIS — N390 Urinary tract infection, site not specified: Secondary | ICD-10-CM | POA: Diagnosis not present

## 2015-11-11 DIAGNOSIS — E1142 Type 2 diabetes mellitus with diabetic polyneuropathy: Secondary | ICD-10-CM | POA: Diagnosis not present

## 2015-11-11 DIAGNOSIS — Z7401 Bed confinement status: Secondary | ICD-10-CM | POA: Diagnosis not present

## 2015-11-11 DIAGNOSIS — N312 Flaccid neuropathic bladder, not elsewhere classified: Secondary | ICD-10-CM | POA: Diagnosis not present

## 2015-11-11 DIAGNOSIS — F72 Severe intellectual disabilities: Secondary | ICD-10-CM | POA: Diagnosis not present

## 2015-11-11 DIAGNOSIS — G9340 Encephalopathy, unspecified: Secondary | ICD-10-CM | POA: Diagnosis not present

## 2015-11-11 DIAGNOSIS — R319 Hematuria, unspecified: Secondary | ICD-10-CM | POA: Diagnosis not present

## 2015-11-11 DIAGNOSIS — I10 Essential (primary) hypertension: Secondary | ICD-10-CM | POA: Diagnosis not present

## 2015-11-11 DIAGNOSIS — E119 Type 2 diabetes mellitus without complications: Secondary | ICD-10-CM | POA: Diagnosis not present

## 2015-11-11 DIAGNOSIS — N200 Calculus of kidney: Secondary | ICD-10-CM | POA: Diagnosis not present

## 2015-11-11 DIAGNOSIS — R32 Unspecified urinary incontinence: Secondary | ICD-10-CM | POA: Diagnosis not present

## 2015-11-11 DIAGNOSIS — E46 Unspecified protein-calorie malnutrition: Secondary | ICD-10-CM | POA: Diagnosis not present

## 2015-11-11 DIAGNOSIS — R1312 Dysphagia, oropharyngeal phase: Secondary | ICD-10-CM | POA: Diagnosis not present

## 2015-11-11 DIAGNOSIS — M6281 Muscle weakness (generalized): Secondary | ICD-10-CM | POA: Diagnosis not present

## 2015-11-11 DIAGNOSIS — R278 Other lack of coordination: Secondary | ICD-10-CM | POA: Diagnosis not present

## 2015-11-11 DIAGNOSIS — E1165 Type 2 diabetes mellitus with hyperglycemia: Secondary | ICD-10-CM | POA: Diagnosis not present

## 2015-11-11 LAB — GLUCOSE, CAPILLARY
GLUCOSE-CAPILLARY: 189 mg/dL — AB (ref 65–99)
Glucose-Capillary: 242 mg/dL — ABNORMAL HIGH (ref 65–99)

## 2015-11-11 MED ORDER — INSULIN GLARGINE 100 UNIT/ML ~~LOC~~ SOLN: 10.0000 [IU] | Freq: Every day | SUBCUTANEOUS | Status: AC

## 2015-11-11 MED ORDER — STARCH (THICKENING) PO POWD: ORAL | Status: DC

## 2015-11-11 NOTE — NC FL2 (Deleted)
[place] MEDICAID FL2 LEVEL OF CARE SCREENING TOOL     IDENTIFICATION  Patient Name: [name] Birthdate: 1955/08/16 Sex: male Admission Date (Current Location): 11/02/2015  [place] and [place] Number:  Roda Shutters [id number] Q Facility and Address:  [facility],  [address] [place] [address]      Provider Number: [id number]  Attending Physician Name and Address:  Standley Brooking, MD  Relative Name and Phone Number:       Current Level of Care: Hospital Recommended Level of Care: Skilled Nursing Facility Prior Approval Number:    Date Approved/Denied:   PASRR Number: Pending  Discharge Plan: SNF    Current Diagnoses: Patient Active Problem List   Diagnosis Date Noted  . Sepsis (HCC) 11/02/2015  . UTI (urinary tract infection) 11/02/2015  . Sinus tachycardia (HCC) 11/02/2015  . Dehydration 11/02/2015  . Leukocytosis 11/02/2015  . DM type 2 causing eye disease, not at goal [facility]) 11/02/2015  . Hypokalemia 03/04/2014  . Bacteremia due to Gram-positive bacteria 06/03/2011  . Hypernatremia 06/03/2011  . Hyperkalemia 05/29/2011  . DKA (diabetic ketoacidoses) (HCC) 05/29/2011  . Renal failure 05/29/2011  . UTI (lower urinary tract infection) 05/29/2011    Orientation RESPIRATION BLADDER Height & Weight     Self  Normal Indwelling catheter Weight: 170 lb (77.111 kg) Height:  5\' 8"  (172.7 cm)  BEHAVIORAL SYMPTOMS/MOOD NEUROLOGICAL BOWEL NUTRITION STATUS  Other (Comment) (n/a)  (n/a) Incontinent Diet (Dysphagia 2 with nectar thick liquids. Carb modified. 100% supervision)  AMBULATORY STATUS COMMUNICATION OF NEEDS Skin   Total Care  (sometimes difficult to understand) Other (Comment), Skin abrasions (Abrasion to leg. Blister to thigh. Open tear to sacrum with foam dressing. )                       Personal Care Assistance Level of Assistance  Bathing, Feeding, Dressing Bathing Assistance: Maximum assistance Feeding assistance: Limited  assistance Dressing Assistance: Maximum assistance     Functional Limitations Info  Sight, Hearing, Speech Sight Info: Adequate Hearing Info: Adequate Speech Info: Impaired    SPECIAL CARE FACTORS FREQUENCY  PT (By licensed PT)     PT Frequency: 5              Contractures Contractures Info: Not present    Additional Factors Info  Isolation Precautions Code Status Info: Full code Allergies Info: No known allergies     Isolation Precautions Info: 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 acetaminophen (TYLENOL) tablet 650 mg  650 mg Oral Q6H PRN Henderson Cloud, MD   650 mg at 11/09/15 2311   Or  . acetaminophen (TYLENOL) suppository 650 mg  650 mg Rectal Q6H PRN Henderson Cloud, MD   650 mg at 11/07/15 1845  . cefixime (SUPRAX) 100 MG/5ML suspension 400 mg  400 mg Oral Daily Standley Brooking, MD   400 mg at 11/11/15 0837  . enoxaparin (LOVENOX) injection 40 mg  40 mg Subcutaneous Q24H Henderson Cloud, MD   40 mg at 11/10/15 2141  . food thickener (THICK IT) powder   Oral PRN Standley Brooking, MD      . guaiFENesin-dextromethorphan [facility] DM) 100-10 MG/5ML syrup 5 mL  5 mL Oral Q4H PRN Henderson Cloud, MD   5 mL at  11/10/15 1317  . insulin aspart (novoLOG) injection 0-5 Units  0-5 Units Subcutaneous QHS Henderson Cloud, MD   1 Units at 11/07/15 2221  . insulin aspart (novoLOG) injection 0-9 Units  0-9 Units Subcutaneous TID WC Estela Isaiah Blakes, MD   2 Units at 11/11/15 [phone]  . insulin aspart (novoLOG) injection 3 Units  3 Units Subcutaneous TID WC Estela Isaiah Blakes, MD   3 Units at 11/11/15 [phone]  . insulin glargine (LANTUS) injection 10 Units  10 Units Subcutaneous QHS Standley Brooking, MD   10 Units at 11/10/15 2142  .  labetalol (NORMODYNE) tablet 200 mg  200 mg Oral BID Standley Brooking, MD   200 mg at 11/11/15 0836  . levothyroxine (SYNTHROID, LEVOTHROID) tablet 50 mcg  50 mcg Oral QAC breakfast Standley Brooking, MD   50 mcg at 11/11/15 0836  . ondansetron (ZOFRAN) tablet 4 mg  4 mg Oral Q6H PRN Henderson Cloud, MD       Or  . ondansetron [facility]) injection 4 mg  4 mg Intravenous Q6H PRN Henderson Cloud, MD      . polyvinyl alcohol (LIQUIFILM TEARS) 1.4 % ophthalmic solution 1 drop  1 drop Both Eyes Q4H Standley Brooking, MD   1 drop at 11/11/15 0837  . senna-docusate (Senokot-S) tablet 1 tablet  1 tablet Oral QHS PRN Henderson Cloud, MD      . sodium chloride flush (NS) 0.9 % injection 3 mL  3 mL Intravenous Q12H Estela Isaiah Blakes, MD   3 mL at 11/11/15 0630     Discharge Medications: Please see discharge summary for a list of discharge medications.  Relevant Imaging Results:  Relevant Lab Results:   Additional Information From ALF. SS#: [id number].  Derenda Fennel [place], [place] [phone]

## 2015-11-11 NOTE — Progress Notes (Signed)
Speech Language Pathology Treatment: Dysphagia  Patient Details Name: Jacob GavelGene R [name] MRN: [id number] DOB: 1955/06/14 Today's Date: 11/11/2015 Time: 1130-1200 SLP Time Calculation (min) (ACUTE ONLY): 30 min  Assessment / Plan / Recommendation Clinical Impression  Pt was changed to D2/chopped due to refusal to eat pureed solids. Pt observed to be coughing upon my arrival and prior to any po given (dry cough). Pt seen up in chair today (required repositioning) and presented with NTL, thin water, and small pieces of peanut butter crackers. Pt with decreased rotary mastication and tends to chew solids with his front teeth making bolus transfer delayed and resulting in residuals in anterior sulcus and along tongue. Pt benefited from liquid wash to help clear. Pt tolerated cued, small sips of thin water however swallow audible and demonstrated increased control with nectars. Recommend continue diet as ordered: D2/chopped with NTL and f/u SLP services at receiving facility for upgrades to thin as appropriate. SLP will sign off pending d/c to SNF today.   HPI HPI: Mr. Jacob ClinesGene [name] is a 60 yo male with developmental cognitive impairment who [facility]. MD requested bedside swallow evaluation due to witnessed coughing and gagging during meal earlier today/yesterday (RD present). [name] is at bedside during evaluation and she reports that at baseline, he occasionally coughs during meals. She denies history of PNA. Head CT negative for acute changes; MRI planned due to altered mental status since Saturday.      SLP Plan  Discharge SLP treatment due to (comment) (going to SNF today)     Recommendations  Diet recommendations: Dysphagia 2 (fine chop);Nectar-thick liquid Liquids provided via: Cup;Straw Medication Administration: Whole meds with puree Supervision: Full supervision/cueing for compensatory strategies;Staff to assist with self feeding Compensations: Minimize  environmental distractions;Slow rate;Small sips/bites;Follow solids with liquid Postural Changes and/or Swallow Maneuvers: Seated upright 90 degrees             Oral Care Recommendations: Oral care BID;Staff/trained caregiver to provide oral care Follow up Recommendations: Skilled Nursing facility Plan: Discharge SLP treatment due to (comment) (going to SNF today)     Thank you,  Havery MorosDabney Porter, CCC-SLP [phone]                 PORTER,DABNEY 11/11/2015, 12:12 PM

## 2015-11-11 NOTE — Clinical Social Work Placement (Signed)
CLINICAL SOCIAL WORK PLACEMENT  NOTE  Date:  11/11/2015  Patient Details  Name: [name] MRN: [id number] Date of Birth: [birth year]  Clinical Social Work is seeking post-discharge placement for this patient at the Skilled  Nursing Facility level of care (*CSW will initial, date and re-position this form in  chart as items are completed):  Yes   Patient/family provided with [place] Clinical Social Work Department's list of facilities offering this level of care within the geographic area requested by the patient (or if unable, by the patient's family).  Yes   Patient/family informed of their freedom to choose among providers that offer the needed level of care, that participate in Medicare, Medicaid or managed care program needed by the patient, have an available bed and are willing to accept the patient.  Yes   Patient/family informed of [facility]'s ownership interest in [facility] and [facility], as well as of the fact that they are under no obligation to receive care at these facilities.  PASRR submitted to EDS on 11/11/15     PASRR number received on       Existing PASRR number confirmed on       FL2 transmitted to all facilities in geographic area requested by pt/family on 11/11/15     FL2 transmitted to all facilities within larger geographic area on       Patient informed that his/her managed care company has contracts with or will negotiate with certain facilities, including the following:            Patient/family informed of bed offers received.  Patient chooses bed at       Physician recommends and patient chooses bed at      Patient to be transferred to   on  .  Patient to be transferred to facility by       Patient family notified on   of transfer.  Name of family member notified:        PHYSICIAN       Additional Comment:    _______________________________________________ Karn Cassis, LCSW 11/11/2015, 8:39  AM [phone]

## 2015-11-11 NOTE — Clinical Social Work Placement (Signed)
CLINICAL SOCIAL WORK PLACEMENT  NOTE  Date:  11/11/2015  Patient Details  Name: [name] MRN: [id number] Date of Birth: 1955-12-14  Clinical Social Work is seeking post-discharge placement for this patient at the Skilled  Nursing Facility level of care (*CSW will initial, date and re-position this form in  chart as items are completed):  Yes   Patient/family provided with [place] Clinical Social Work Department's list of facilities offering this level of care within the geographic area requested by the patient (or if unable, by the patient's family).  Yes   Patient/family informed of their freedom to choose among providers that offer the needed level of care, that participate in Medicare, Medicaid or managed care program needed by the patient, have an available bed and are willing to accept the patient.  Yes   Patient/family informed of [facility]'s ownership interest in [facility] and [facility], as well as of the fact that they are under no obligation to receive care at these facilities.  PASRR submitted to EDS on 11/11/15     PASRR number received on 11/11/15     Existing PASRR number confirmed on       FL2 transmitted to all facilities in geographic area requested by pt/family on 11/11/15     FL2 transmitted to all facilities within larger geographic area on       Patient informed that his/her managed care company has contracts with or will negotiate with certain facilities, including the following:        Yes   Patient/family informed of bed offers received.  Patient chooses bed at [facility]     Physician recommends and patient chooses bed at      Patient to be transferred to [facility] at [place] on 11/11/15.  Patient to be transferred to facility by   [facility]    Patient family notified on 11/11/15 of transfer.  Name of family member notified:  [name]- sister     PHYSICIAN       Additional Comment:  [facility] aware of 30 day  pasarr. Sister plans to notify [facility].   _______________________________________________ Karn Cassis, LCSW 11/11/2015, 3:27 PM [phone]

## 2015-11-11 NOTE — Discharge Summary (Signed)
Physician Discharge Summary  Jacob GavelGene R Cocke RUE:[id number] DOB: 19-Dec-1955 DOA: 11/02/2015  PCP: Cassell SmilesFUSCO,LAWRENCE J., MD  Admit date: 11/02/2015 Discharge date: 11/11/2015  Recommendations for Outpatient Follow-up:  1. Follow-up recurring UTI, urinary retention, chronic bladder issues  Follow-up Information    Follow up with Cassell SmilesFUSCO,LAWRENCE J., MD. Schedule an appointment as soon as possible for a visit in 1 week.   Specialty:  Internal Medicine   Contact information:   [address] [place] [address] [phone]       Follow up with Chelsea AusAHLSTEDT, STEPHEN M, MD.   Specialty:  Urology   Contact information:   [address] [place] [address] [phone]      Discharge Diagnoses:  1. Sepsis 2. Secondary to UTI. 3. Urinary retention 4. Acute encephalopathy. 5. Dysphagia. 6. Mental retardation.    Discharge Condition: Improved  Disposition: Return to ALF.   Diet recommendation:  Dysphagia 2 (fine chop);Nectar-thick liquid Liquids provided via: Cup;Straw Medication Administration: Whole meds with puree Supervision: Full supervision/cueing for compensatory strategies;Staff to assist with self feeding Compensations: Minimize environmental distractions;Slow rate;Small sips/bites;Follow solids with liquid Postural Changes and/or Swallow Maneuvers: Seated upright 90 degrees  Filed Weights   11/02/15 0837 11/02/15 1525  Weight: 77.111 kg (170 lb) 77.111 kg (170 lb)    History of present illness:  [age] yom with history of mental retardation presented from SNF with complaints of acute encephalopathy. At baseline, he is able to walk with a walker, feed himself, and is communicative. He was noted to be unresponsive and was transferred from his ALF to the hospital for further evaluation and AMS. Source of infection found to be UTI and he has been referred for admission.   Hospital Course:  Patient was admitted for sepsis secondary to presumed UTI. Patient has  history of UTI and bladder abnormalities and urinary retention for which foley catheter was placed and continued. Urine culture was unrevealing, but based on history, recurrent bladder infections, treated empirically. Acute encephalopathy secondary to infection  was very slow to improve. Speech therapy evaluated and recommended dysphagia 2 diet due to aspiration risks. seen by physical therapy with recommendations for rehabilitation prior return to assisted living. Plan outpatient follow-up with urology for urinary retention, frequent UTI and known bladder problems.   1. Sepsis secondary to UTI. Appears completely resolved. 2. Presumed UTI although culture revealed no growth. Appears resolved. Has a history of UTI and bladder abnormalities. Urinary retention on admission. Foley catheter therefore continued based on previous UTIs difficulty voiding. 3. Acute encephalopathy secondary to infection above. Now at baseline. 4. Dysphagia. Speech therapy recommendations noted. Dysphagia 2 diet. 5. Hypokalemia, resolved. 6. Mental retardation. 7. Dm type 2. Stable.  Consultants:  ST- Dysphagia 1-Nectar-thick liquid. Liquid Administration via: Cup Medication Administration: Whole meds with puree Supervision: Full supervision/cueing for compensatory strategies;Staff to assist with self feeding Compensations: Minimize environmental distractions;Slow rate;Small sips/bites Postural Changes: Seated upright at 90 degrees;Remain upright for at least 30 minutes after po intake   PT - SNF with 24 hr supervision.  Procedures:  None  Antimicrobials:  Rocephin 6/5>>6/11  Suprax 6/11>>6/13  Discharge Instructions   Current Discharge Medication List    START taking these medications   Details  food thickener (THICK IT) POWD Use to thicken liquids      CONTINUE these medications which have CHANGED   Details  insulin glargine (LANTUS) 100 UNIT/ML injection Inject 0.1 mLs (10 Units total) into the  skin at bedtime.  CONTINUE these medications which have NOT CHANGED   Details  acetaminophen (TYLENOL) 500 MG tablet Take 500 mg by mouth every 6 (six) hours as needed.    Alum & Mag Hydroxide-Simeth (ANTACID & ANTIGAS) 200-200-20 MG/5ML SUSP Take 30 mLs by mouth daily as needed (heartburn).    benzonatate (TESSALON) 100 MG capsule Take 100 mg by mouth 3 (three) times daily as needed for cough.    labetalol (NORMODYNE) 300 MG tablet Take 300 mg by mouth 2 (two) times daily.     levothyroxine (SYNTHROID, LEVOTHROID) 50 MCG tablet Take 50 mcg by mouth daily before breakfast.    magnesium hydroxide (MILK OF MAGNESIA) 400 MG/5ML suspension Take 30 mLs by mouth daily as needed for mild constipation.    Potassium Chloride 40 MEQ/15ML (20%) SOLN Take 7.5 mLs by mouth daily.    tamsulosin (FLOMAX) 0.4 MG CAPS capsule Take 0.4 mg by mouth every other day.     tolterodine (DETROL) 1 MG tablet Take 1 mg by mouth daily as needed (bladder).      STOP taking these medications     furosemide (LASIX) 20 MG tablet      HUMALOG KWIKPEN 100 UNIT/ML KiwkPen      magnesium sulfate (EPSOM SALT) GRAN        No Known Allergies  The results of significant diagnostics from this hospitalization (including imaging, microbiology, ancillary and laboratory) are listed below for reference.    Significant Diagnostic Studies: Ct Head Wo Contrast  11/06/2015  CLINICAL DATA:  Acute encephalopathy.  Mental retardation. EXAM: CT HEAD WITHOUT CONTRAST TECHNIQUE: Contiguous axial images were obtained from the base of the skull through the vertex without intravenous contrast. COMPARISON:  None. FINDINGS: There is moderate diffuse atrophy. There is asymmetric atrophy in the right posterior fossa with right cerebellar hypoplasia compared to the left cerebellum which appears essentially normal. There is no intracranial mass, hemorrhage, extra-axial fluid collection, or midline shift. There is mild small vessel  disease in the centra semiovale bilaterally. Elsewhere gray-white compartments appear unremarkable. No acute infarct is evident. Bony calvarium appears intact. There is opacification of most of the mastoid air cells on the left. A few mastoids on the right are opacified. Most of the mastoids on the right are clear. No intraorbital lesions are evident. There is a small retention cyst in the inferior left maxillary antrum. IMPRESSION: Moderate diffuse atrophy. 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 The left cerebellar hemisphere appears unremarkable. Fourth ventricle is in the midline. There is relatively mild periventricular small vessel disease. No acute infarct evident. No hemorrhage or mass effect. 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 There is a small retention cyst in the inferior left maxillary antrum. Electronically Signed   By: Bretta Bang III M.D.   On: 11/06/2015 12:02   Dg Chest Port 1 View  11/02/2015  CLINICAL DATA:  Diabetes with hypertension. Hypernatremia. Concern for potential sepsis EXAM: PORTABLE CHEST 1 VIEW COMPARISON:  November 02, 2015 study obtained earlier in the day FINDINGS: There is no edema or consolidation. Heart is slightly enlarged with pulmonary vascularity within normal limits. No adenopathy. No bone lesions. IMPRESSION: Mild cardiac enlargement.  No edema or consolidation. Electronically Signed   By: Bretta Bang III M.D.   On: 11/02/2015 17:03   Dg Chest Port 1 View  11/02/2015  CLINICAL DATA:  Altered mental status. EXAM: PORTABLE CHEST 1 VIEW COMPARISON:  08/04/2012  FINDINGS: Mild cardiomegaly. No confluent opacities, effusions or overt edema. No acute bony abnormality. IMPRESSION: Mild cardiomegaly.  No active disease. Electronically Signed   By: Charlett Nose M.D.   On: 11/02/2015 08:46   Dg Abd Portable 1v  11/07/2015  CLINICAL DATA:  Abdominal pain with  discomfort. EXAM: PORTABLE ABDOMEN - 1 VIEW COMPARISON:  None. FINDINGS: The bowel gas pattern is normal. Gas and stool noted throughout the colon up to the rectum. Nonobstructing left renal calculus is again noted measuring 1.2 cm. IMPRESSION: 1. Nonobstructive bowel gas pattern 2. Left renal calculus. Electronically Signed   By: Signa Kell M.D.   On: 11/07/2015 13:56    Microbiology: Recent Results (from the past 240 hour(s))  Blood Culture (routine x 2)     Status: None   Collection Time: 11/02/15  8:07 AM  Result Value Ref Range Status   Specimen Description BLOOD RIGHT ANTECUBITAL DRAWN BY RN  Final   Special Requests BOTTLES DRAWN AEROBIC AND ANAEROBIC 6CC  Final   Culture NO GROWTH 6 DAYS  Final   Report Status 11/08/2015 FINAL  Final  Blood Culture (routine x 2)     Status: None   Collection Time: 11/02/15  8:18 AM  Result Value Ref Range Status   Specimen Description BLOOD BLOOD RIGHT HAND DRAWN BY RN  Final   Special Requests BOTTLES DRAWN AEROBIC AND ANAEROBIC 6CC  Final   Culture NO GROWTH 6 DAYS  Final   Report Status 11/08/2015 FINAL  Final  Urine culture     Status: None   Collection Time: 11/02/15 10:03 AM  Result Value Ref Range Status   Specimen Description URINE, CATHETERIZED  Final   Special Requests NONE  Final   Culture NO GROWTH Performed at [facility]   Final   Report Status 11/04/2015 FINAL  Final  MRSA PCR Screening     Status: Abnormal   Collection Time: 11/02/15  3:39 PM  Result Value Ref Range Status   MRSA by PCR POSITIVE (A) NEGATIVE Final    Comment:        The GeneXpert MRSA Assay (FDA approved for NASAL specimens only), is one component of a comprehensive MRSA colonization surveillance program. It is not intended to diagnose MRSA infection nor to guide or monitor treatment for MRSA infections. RESULT CALLED TO, READ BACK BY AND VERIFIED WITH:  THOMAS,C @ 1922 ON 11/02/15 BY WOODIE,J      Labs: Basic Metabolic  Panel:  Recent Labs Lab 11/06/15 0823 11/07/15 0419 11/08/15 1010 11/09/15 0519  NA 155* 147* 143 146*  K 2.7* 2.9* 3.3* 3.7  CL 123* 116* 112* 117*  CO2 GLUCOSE 200* 298* 226* 154*  BUN CREATININE 1.11 0.93 0.91 0.83  CALCIUM 7.7* 7.3* 7.8* 7.8*  MG  --  2.5*  --  2.2  PHOS  --  1.9*  --  2.9    Recent Labs Lab 11/06/15 0823  WBC 11.1*  HGB 14.2  HCT 43.3  MCV 92.3  PLT 188   CBG:  Recent Labs Lab 11/10/15 1122 11/10/15 1715 11/10/15 2158 11/11/15 0717 11/11/15 1148  GLUCAP 234* 235* 167* 189* 242*    Principal Problem:   Sepsis (HCC) Active Problems:   UTI (urinary tract infection)   Sinus tachycardia (HCC)   Dehydration   Leukocytosis   DM type 2 causing eye disease, not at goal [facility])   Time coordinating discharge: 35 minutes  Signed:  Brendia Sacks, MD [facility] 11/11/2015, 1:47 PM   By signing my name below, I, Adron Bene, attest that this documentation has been prepared under the direction and in the presence of Daniel P. Irene Limbo, MD. Electronically Signed: Adron Bene, Scribe.  11/11/2015 10:50am

## 2015-11-11 NOTE — Progress Notes (Signed)
Patient transferred to [facility].  Report given to Danielle at facility.  IV removed - WNL.  Patient and family aware of plan.  EMS here to transport, patient stable at this time.  Foley left in place.

## 2015-11-11 NOTE — Progress Notes (Signed)
PROGRESS NOTE  [name] EAV:[id number] DOB: [birth year] DOA: 11/02/2015 PCP: Cassell Smiles., MD  Brief Narrative: 44 yom with history of mental retardation presented from SNF with complaints of acute encephalopathy. Source of infection found to be UTI and he has been referred for admission.  Assessment/Plan: 1. Sepsis secondary to UTI. Appears completely resolved. 2. Presumed UTI although culture revealed no growth. Appears resolved. Has a history of UTI and bladder abnormalities. Urinary retention on admission. Foley catheter therefore continued based on previous UTIs difficulty voiding. 3. Acute encephalopathy secondary to infection above. Now at baseline. 4. Dysphagia. Speech therapy recommendations noted. Dysphagia 2 diet. 5. Hypokalemia, resolved. 6. Mental retardation. 7. Dm type 2. Stable.   Appears medically stable for discharge. He completes antibiotics today. Will plan for outpatient follow-up with his urologist Dr. Hillis Range for urinary retention, recurrent UTI and chronic bladder issues. For now fully catheter in place given urinary retention.  Physical therapy is recommended short-term skilled rehabilitation.   Discussed in detail with sister at bedside  DVT prophylaxis: Lovenox Code Status: Full Family Communication: Sister, [name] (Legal guardian) Disposition Plan: Discharge to SNF once improved, likely later today  Brendia Sacks, MD  [facility] Direct contact: [phone] --Via amion app OR  --[email or web address]; password [email or web address]  7PM-7AM contact night coverage as above 11/11/2015, 7:09 AM  LOS: 9 days   Consultants:  ST- Dysphagia 1-Nectar-thick liquid.  Liquid Administration via: Cup Medication Administration: Whole meds with puree Supervision: Full supervision/cueing for compensatory strategies;Staff to assist with self feeding Compensations: Minimize environmental distractions;Slow rate;Small sips/bites Postural Changes: Seated upright at 90  degrees;Remain upright for at least 30 minutes after po intake   PT - SNF with 24 hr supervision.  Procedures:  None  Antimicrobials:  Rocephin 6/5>>6/11  Suprax 6/11>>13  HPI/Subjective: "I'm fine". Denies complaints. Awake. Appetite seems to be poor. Sister bedside.  Objective: Filed Vitals:   11/10/15 1148 11/10/15 1444 11/10/15 2159 11/11/15 0640  BP:  124/68 134/77 146/58  Pulse:  81 82 80  Temp:  98.7 F (37.1 C) 98 F (36.7 C) 98 F (36.7 C)  TempSrc:  Axillary Axillary Axillary  Resp:  20 20 20   Height:      Weight:      SpO2: 99% 100% 99% 100%    Intake/Output Summary (Last 24 hours) at 11/11/15 0709 Last data filed at 11/11/15 0641  Gross per 24 hour  Intake    180 ml  Output   2150 ml  Net  -1970 ml     Filed Weights   11/02/15 0837 11/02/15 1525  Weight: 77.111 kg (170 lb) 77.111 kg (170 lb)    Exam: Constitutional:  Appears calm and comfortable Respiratory:  CTA bilaterally, no w/r/r.  Respiratory effort normal. No retractions or accessory muscle use Cardiovascular:  RRR, no m/r/g No LE extremity edema   Abdomen:  Abdomen appears normal; no tenderness or masses Skin:  No rashes, lesions, ulcers noted Neurologic:  Psychiatric:  Appears to be a baseline mentally, answers simple questions but unreliably.   I have personally reviewed following labs and imaging studies:  Blood sugars stable.   Scheduled Meds: . cefixime  400 mg Oral Daily  . enoxaparin (LOVENOX) injection  40 mg Subcutaneous Q24H  . insulin aspart  0-5 Units Subcutaneous QHS  . insulin aspart  0-9 Units Subcutaneous TID WC  . insulin aspart  3 Units Subcutaneous TID WC  . insulin glargine  10 Units Subcutaneous QHS  . labetalol  200 mg Oral  BID  . levothyroxine  50 mcg Oral QAC breakfast  . polyvinyl alcohol  1 drop Both Eyes Q4H  . sodium chloride flush  3 mL Intravenous Q12H   Continuous Infusions:    Principal Problem:   Sepsis (HCC) Active  Problems:   UTI (urinary tract infection)   Sinus tachycardia (HCC)   Dehydration   Leukocytosis   DM type 2 causing eye disease, not at goal [facility])   LOS: 9 days    By signing my name below, I, Adron Bene, attest that this documentation has been prepared under the direction and in the presence of Kazmir Oki P. Irene Limbo, MD. Electronically Signed: Adron Bene, Scribe.  11/11/2015 10:50am   I personally performed the services described in this documentation. All medical record entries made by the scribe were at my direction. I have reviewed the chart and agree that the record reflects my personal performance and is accurate and complete. Brendia Sacks, MD

## 2015-11-11 NOTE — Care Management Note (Signed)
Case Management Note  Patient Details  Name: Jacob GavelGene R [name] MRN: [id number] Date of Birth: 30-May-1956  Expected Discharge Date:  11/06/15               Expected Discharge Plan:  Skilled Nursing Facility  In-House Referral:  Clinical Social Work  Discharge planning Services  CM Consult  Post Acute Care Choice:  NA Choice offered to:  NA  DME Arranged:    DME Agency:     HH Arranged:    HH Agency:     Status of Service:  Completed, signed off  Medicare Important Message Given:  Yes Date Medicare IM Given:    Medicare IM give by:    Date Additional Medicare IM Given:    Additional Medicare Important Message give by:     If discussed at Long Length of Stay Meetings, dates discussed:  11/11/2015  Additional Comments: CSW has made arrangements for SNF placement. Family aware. Discharging today. No further CM needs.  Malcolm Metrohildress, Maelin Kurkowski [place], RN 11/11/2015, 2:18 PM

## 2015-11-11 NOTE — [place] FL2 (Signed)
[place] MEDICAID FL2 LEVEL OF CARE SCREENING TOOL     IDENTIFICATION  Patient Name: Jacob GavelGene R [name] Birthdate: [birth year] Sex: male Admission Date (Current Location): 11/02/2015  [place]  [place] [id number] Facility and Address:  [facility],  [address], [place] [id number]      Provider Number: [id number]  Attending Physician Name and Address:  Standley Brookinganiel P Goodrich, MD  Relative Name and Phone Number:       Current Level of Care: Hospital Recommended Level of Care: Skilled Nursing Facility Prior Approval Number:    Date Approved/Denied:   PASRR Number: [id number]  Discharge Plan: SNF    Current Diagnoses: Patient Active Problem List   Diagnosis Date Noted  . Sepsis (HCC) 11/02/2015  . UTI (urinary tract infection) 11/02/2015  . Sinus tachycardia (HCC) 11/02/2015  . Dehydration 11/02/2015  . Leukocytosis 11/02/2015  . DM type 2 causing eye disease, not at goal [facility](HCC) 11/02/2015  . Hypokalemia 03/04/2014  . Bacteremia due to Gram-positive bacteria 06/03/2011  . Hypernatremia 06/03/2011  . Hyperkalemia 05/29/2011  . DKA (diabetic ketoacidoses) (HCC) 05/29/2011  . Renal failure 05/29/2011  . UTI (lower urinary tract infection) 05/29/2011    Orientation RESPIRATION BLADDER Height & Weight     Self  Normal Indwelling catheter Weight: 170 lb (77.111 kg) Height:  5\' 8"  (172.7 cm)  BEHAVIORAL SYMPTOMS/MOOD NEUROLOGICAL BOWEL NUTRITION STATUS  Other (Comment) (n/a)  (n/a) Incontinent Diet (Dysphagia 2 with nectar thick liquids. Carb modified. 100% supervision)  AMBULATORY STATUS COMMUNICATION OF NEEDS Skin   Total Care  (sometimes difficult to understand) Other (Comment), Skin abrasions (Abrasion to leg. Blister to thigh. Open tear to sacrum with foam dressing. )                       Personal Care Assistance Level of Assistance  Bathing, Feeding, Dressing Bathing Assistance: Maximum assistance Feeding assistance: Limited  assistance Dressing Assistance: Maximum assistance     Functional Limitations Info  Sight, Hearing, Speech Sight Info: Adequate Hearing Info: Adequate Speech Info: Impaired    SPECIAL CARE FACTORS FREQUENCY  PT (By licensed PT)     PT Frequency: 5              Contractures Contractures Info: Not present    Additional Factors Info  Isolation Precautions Code Status Info: Full code Allergies Info: No known allergies     Isolation Precautions Info: 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 acetaminophen (TYLENOL) tablet 650 mg  650 mg Oral Q6H PRN Henderson CloudEstela Y Hernandez Acosta, MD   650 mg at 11/09/15 2311   Or  . acetaminophen (TYLENOL) suppository 650 mg  650 mg Rectal Q6H PRN Henderson CloudEstela Y Hernandez Acosta, MD   650 mg at 11/07/15 1845  . cefixime (SUPRAX) 100 MG/5ML suspension 400 mg  400 mg Oral Daily Standley Brookinganiel P Goodrich, MD   400 mg at 11/11/15 0837  . enoxaparin (LOVENOX) injection 40 mg  40 mg Subcutaneous Q24H Henderson CloudEstela Y Hernandez Acosta, MD   40 mg at 11/10/15 2141  . food thickener (THICK IT) powder   Oral PRN Standley Brookinganiel P Goodrich, MD      . guaiFENesin-dextromethorphan [facility](ROBITUSSIN DM) 100-10 MG/5ML syrup 5 mL  5 mL Oral Q4H PRN Henderson CloudEstela Y Hernandez Acosta, MD   5 mL at  11/11/15 1201  . insulin aspart (novoLOG) injection 0-5 Units  0-5 Units Subcutaneous QHS Henderson Cloud, MD   1 Units at 11/07/15 2221  . insulin aspart (novoLOG) injection 0-9 Units  0-9 Units Subcutaneous TID WC Estela Isaiah Blakes, MD   3 Units at 11/11/15 1202  . insulin aspart (novoLOG) injection 3 Units  3 Units Subcutaneous TID WC Estela Isaiah Blakes, MD   3 Units at 11/11/15 1201  . insulin glargine (LANTUS) injection 10 Units  10 Units Subcutaneous QHS Standley Brooking, MD   10 Units at 11/10/15 2142  .  labetalol (NORMODYNE) tablet 200 mg  200 mg Oral BID Standley Brooking, MD   200 mg at 11/11/15 0836  . levothyroxine (SYNTHROID, LEVOTHROID) tablet 50 mcg  50 mcg Oral QAC breakfast Standley Brooking, MD   50 mcg at 11/11/15 0836  . ondansetron (ZOFRAN) tablet 4 mg  4 mg Oral Q6H PRN Henderson Cloud, MD       Or  . ondansetron [facility]) injection 4 mg  4 mg Intravenous Q6H PRN Henderson Cloud, MD      . polyvinyl alcohol (LIQUIFILM TEARS) 1.4 % ophthalmic solution 1 drop  1 drop Both Eyes Q4H Standley Brooking, MD   1 drop at 11/11/15 1202  . senna-docusate (Senokot-S) tablet 1 tablet  1 tablet Oral QHS PRN Henderson Cloud, MD      . sodium chloride flush (NS) 0.9 % injection 3 mL  3 mL Intravenous Q12H Estela Isaiah Blakes, MD   3 mL at 11/11/15 9528     Discharge Medications: Please see discharge summary for a list of discharge medications.  Relevant Imaging Results:  Relevant Lab Results:   Additional Information From ALF. SS#: [id number]. 30 day pasarr expires 12/11/15.  Derenda Fennel [place], [place] [phone]

## 2015-11-13 DIAGNOSIS — M4000 Postural kyphosis, site unspecified: Secondary | ICD-10-CM | POA: Diagnosis not present

## 2015-11-13 DIAGNOSIS — E46 Unspecified protein-calorie malnutrition: Secondary | ICD-10-CM | POA: Diagnosis not present

## 2015-11-13 DIAGNOSIS — D72829 Elevated white blood cell count, unspecified: Secondary | ICD-10-CM | POA: Diagnosis not present

## 2015-11-14 DIAGNOSIS — G9341 Metabolic encephalopathy: Secondary | ICD-10-CM | POA: Diagnosis not present

## 2015-11-14 DIAGNOSIS — N3020 Other chronic cystitis without hematuria: Secondary | ICD-10-CM | POA: Diagnosis not present

## 2015-11-14 DIAGNOSIS — E119 Type 2 diabetes mellitus without complications: Secondary | ICD-10-CM | POA: Diagnosis not present

## 2015-11-14 DIAGNOSIS — N390 Urinary tract infection, site not specified: Secondary | ICD-10-CM | POA: Diagnosis not present

## 2015-11-14 DIAGNOSIS — I10 Essential (primary) hypertension: Secondary | ICD-10-CM | POA: Diagnosis not present

## 2015-11-26 DIAGNOSIS — R32 Unspecified urinary incontinence: Secondary | ICD-10-CM | POA: Diagnosis not present

## 2015-12-22 DIAGNOSIS — R1310 Dysphagia, unspecified: Secondary | ICD-10-CM | POA: Diagnosis not present

## 2015-12-22 DIAGNOSIS — M4000 Postural kyphosis, site unspecified: Secondary | ICD-10-CM | POA: Diagnosis not present

## 2015-12-22 DIAGNOSIS — R531 Weakness: Secondary | ICD-10-CM | POA: Diagnosis not present

## 2015-12-23 ENCOUNTER — Ambulatory Visit (INDEPENDENT_AMBULATORY_CARE_PROVIDER_SITE_OTHER): Payer: Medicare Other | Admitting: Urology

## 2015-12-23 DIAGNOSIS — N200 Calculus of kidney: Secondary | ICD-10-CM

## 2015-12-23 DIAGNOSIS — N312 Flaccid neuropathic bladder, not elsewhere classified: Secondary | ICD-10-CM

## 2015-12-25 ENCOUNTER — Other Ambulatory Visit (HOSPITAL_COMMUNITY): Payer: Self-pay | Admitting: Internal Medicine

## 2015-12-25 DIAGNOSIS — T17900A Unspecified foreign body in respiratory tract, part unspecified causing asphyxiation, initial encounter: Secondary | ICD-10-CM

## 2015-12-26 DIAGNOSIS — E1142 Type 2 diabetes mellitus with diabetic polyneuropathy: Secondary | ICD-10-CM | POA: Diagnosis not present

## 2015-12-26 DIAGNOSIS — L603 Nail dystrophy: Secondary | ICD-10-CM | POA: Diagnosis not present

## 2015-12-29 ENCOUNTER — Other Ambulatory Visit (HOSPITAL_COMMUNITY): Payer: Self-pay | Admitting: Internal Medicine

## 2015-12-29 DIAGNOSIS — R1310 Dysphagia, unspecified: Secondary | ICD-10-CM

## 2015-12-30 ENCOUNTER — Other Ambulatory Visit (HOSPITAL_COMMUNITY): Payer: Self-pay | Admitting: Internal Medicine

## 2015-12-30 ENCOUNTER — Ambulatory Visit (HOSPITAL_COMMUNITY): Payer: Medicare Other

## 2016-01-01 ENCOUNTER — Ambulatory Visit (HOSPITAL_COMMUNITY)
Admission: RE | Admit: 2016-01-01 | Discharge: 2016-01-01 | Disposition: A | Discharge status: Home / Self Care | Payer: Medicare Other | Source: Ambulatory Visit | Attending: Internal Medicine | Admitting: Internal Medicine

## 2016-01-01 DIAGNOSIS — R05 Cough: Secondary | ICD-10-CM | POA: Diagnosis not present

## 2016-01-01 DIAGNOSIS — R1310 Dysphagia, unspecified: Secondary | ICD-10-CM | POA: Insufficient documentation

## 2016-01-05 DIAGNOSIS — G9340 Encephalopathy, unspecified: Secondary | ICD-10-CM | POA: Diagnosis not present

## 2016-01-05 DIAGNOSIS — R1312 Dysphagia, oropharyngeal phase: Secondary | ICD-10-CM | POA: Diagnosis not present

## 2016-01-05 DIAGNOSIS — M6281 Muscle weakness (generalized): Secondary | ICD-10-CM | POA: Diagnosis not present

## 2016-01-05 DIAGNOSIS — I10 Essential (primary) hypertension: Secondary | ICD-10-CM | POA: Diagnosis not present

## 2016-01-05 DIAGNOSIS — A419 Sepsis, unspecified organism: Secondary | ICD-10-CM | POA: Diagnosis not present

## 2016-01-05 DIAGNOSIS — N390 Urinary tract infection, site not specified: Secondary | ICD-10-CM | POA: Diagnosis not present

## 2016-01-05 DIAGNOSIS — A4189 Other specified sepsis: Secondary | ICD-10-CM | POA: Diagnosis not present

## 2016-01-05 DIAGNOSIS — R278 Other lack of coordination: Secondary | ICD-10-CM | POA: Diagnosis not present

## 2016-01-05 DIAGNOSIS — R339 Retention of urine, unspecified: Secondary | ICD-10-CM | POA: Diagnosis not present

## 2016-01-05 DIAGNOSIS — M4000 Postural kyphosis, site unspecified: Secondary | ICD-10-CM | POA: Diagnosis not present

## 2016-01-06 DIAGNOSIS — M4000 Postural kyphosis, site unspecified: Secondary | ICD-10-CM | POA: Diagnosis not present

## 2016-01-06 DIAGNOSIS — I10 Essential (primary) hypertension: Secondary | ICD-10-CM | POA: Diagnosis not present

## 2016-01-06 DIAGNOSIS — A419 Sepsis, unspecified organism: Secondary | ICD-10-CM | POA: Diagnosis not present

## 2016-01-06 DIAGNOSIS — N390 Urinary tract infection, site not specified: Secondary | ICD-10-CM | POA: Diagnosis not present

## 2016-01-06 DIAGNOSIS — R1312 Dysphagia, oropharyngeal phase: Secondary | ICD-10-CM | POA: Diagnosis not present

## 2016-01-06 DIAGNOSIS — A4189 Other specified sepsis: Secondary | ICD-10-CM | POA: Diagnosis not present

## 2016-01-06 DIAGNOSIS — G9340 Encephalopathy, unspecified: Secondary | ICD-10-CM | POA: Diagnosis not present

## 2016-01-06 DIAGNOSIS — R339 Retention of urine, unspecified: Secondary | ICD-10-CM | POA: Diagnosis not present

## 2016-01-06 DIAGNOSIS — M6281 Muscle weakness (generalized): Secondary | ICD-10-CM | POA: Diagnosis not present

## 2016-01-06 DIAGNOSIS — R278 Other lack of coordination: Secondary | ICD-10-CM | POA: Diagnosis not present

## 2016-01-07 DIAGNOSIS — F79 Unspecified intellectual disabilities: Secondary | ICD-10-CM | POA: Diagnosis not present

## 2016-01-07 DIAGNOSIS — G9340 Encephalopathy, unspecified: Secondary | ICD-10-CM | POA: Diagnosis not present

## 2016-01-07 DIAGNOSIS — R278 Other lack of coordination: Secondary | ICD-10-CM | POA: Diagnosis not present

## 2016-01-07 DIAGNOSIS — I10 Essential (primary) hypertension: Secondary | ICD-10-CM | POA: Diagnosis not present

## 2016-01-07 DIAGNOSIS — R1312 Dysphagia, oropharyngeal phase: Secondary | ICD-10-CM | POA: Diagnosis not present

## 2016-01-07 DIAGNOSIS — R339 Retention of urine, unspecified: Secondary | ICD-10-CM | POA: Diagnosis not present

## 2016-01-07 DIAGNOSIS — A419 Sepsis, unspecified organism: Secondary | ICD-10-CM | POA: Diagnosis not present

## 2016-01-07 DIAGNOSIS — Z794 Long term (current) use of insulin: Secondary | ICD-10-CM | POA: Diagnosis not present

## 2016-01-07 DIAGNOSIS — N390 Urinary tract infection, site not specified: Secondary | ICD-10-CM | POA: Diagnosis not present

## 2016-01-07 DIAGNOSIS — E119 Type 2 diabetes mellitus without complications: Secondary | ICD-10-CM | POA: Diagnosis not present

## 2016-01-07 DIAGNOSIS — M4000 Postural kyphosis, site unspecified: Secondary | ICD-10-CM | POA: Diagnosis not present

## 2016-01-07 DIAGNOSIS — A4189 Other specified sepsis: Secondary | ICD-10-CM | POA: Diagnosis not present

## 2016-01-07 DIAGNOSIS — M6281 Muscle weakness (generalized): Secondary | ICD-10-CM | POA: Diagnosis not present

## 2016-01-08 DIAGNOSIS — A4189 Other specified sepsis: Secondary | ICD-10-CM | POA: Diagnosis not present

## 2016-01-08 DIAGNOSIS — A419 Sepsis, unspecified organism: Secondary | ICD-10-CM | POA: Diagnosis not present

## 2016-01-08 DIAGNOSIS — R339 Retention of urine, unspecified: Secondary | ICD-10-CM | POA: Diagnosis not present

## 2016-01-08 DIAGNOSIS — G9340 Encephalopathy, unspecified: Secondary | ICD-10-CM | POA: Diagnosis not present

## 2016-01-08 DIAGNOSIS — R278 Other lack of coordination: Secondary | ICD-10-CM | POA: Diagnosis not present

## 2016-01-08 DIAGNOSIS — I10 Essential (primary) hypertension: Secondary | ICD-10-CM | POA: Diagnosis not present

## 2016-01-08 DIAGNOSIS — M4000 Postural kyphosis, site unspecified: Secondary | ICD-10-CM | POA: Diagnosis not present

## 2016-01-08 DIAGNOSIS — R1312 Dysphagia, oropharyngeal phase: Secondary | ICD-10-CM | POA: Diagnosis not present

## 2016-01-08 DIAGNOSIS — M6281 Muscle weakness (generalized): Secondary | ICD-10-CM | POA: Diagnosis not present

## 2016-01-08 DIAGNOSIS — N390 Urinary tract infection, site not specified: Secondary | ICD-10-CM | POA: Diagnosis not present

## 2016-01-09 DIAGNOSIS — A4189 Other specified sepsis: Secondary | ICD-10-CM | POA: Diagnosis not present

## 2016-01-09 DIAGNOSIS — N390 Urinary tract infection, site not specified: Secondary | ICD-10-CM | POA: Diagnosis not present

## 2016-01-09 DIAGNOSIS — R339 Retention of urine, unspecified: Secondary | ICD-10-CM | POA: Diagnosis not present

## 2016-01-09 DIAGNOSIS — R278 Other lack of coordination: Secondary | ICD-10-CM | POA: Diagnosis not present

## 2016-01-09 DIAGNOSIS — M6281 Muscle weakness (generalized): Secondary | ICD-10-CM | POA: Diagnosis not present

## 2016-01-09 DIAGNOSIS — G9340 Encephalopathy, unspecified: Secondary | ICD-10-CM | POA: Diagnosis not present

## 2016-01-09 DIAGNOSIS — A419 Sepsis, unspecified organism: Secondary | ICD-10-CM | POA: Diagnosis not present

## 2016-01-09 DIAGNOSIS — I10 Essential (primary) hypertension: Secondary | ICD-10-CM | POA: Diagnosis not present

## 2016-01-09 DIAGNOSIS — R1312 Dysphagia, oropharyngeal phase: Secondary | ICD-10-CM | POA: Diagnosis not present

## 2016-01-09 DIAGNOSIS — M4000 Postural kyphosis, site unspecified: Secondary | ICD-10-CM | POA: Diagnosis not present

## 2016-01-12 DIAGNOSIS — I10 Essential (primary) hypertension: Secondary | ICD-10-CM | POA: Diagnosis not present

## 2016-01-12 DIAGNOSIS — A4189 Other specified sepsis: Secondary | ICD-10-CM | POA: Diagnosis not present

## 2016-01-12 DIAGNOSIS — N390 Urinary tract infection, site not specified: Secondary | ICD-10-CM | POA: Diagnosis not present

## 2016-01-12 DIAGNOSIS — R278 Other lack of coordination: Secondary | ICD-10-CM | POA: Diagnosis not present

## 2016-01-12 DIAGNOSIS — A419 Sepsis, unspecified organism: Secondary | ICD-10-CM | POA: Diagnosis not present

## 2016-01-12 DIAGNOSIS — R339 Retention of urine, unspecified: Secondary | ICD-10-CM | POA: Diagnosis not present

## 2016-01-12 DIAGNOSIS — G9340 Encephalopathy, unspecified: Secondary | ICD-10-CM | POA: Diagnosis not present

## 2016-01-12 DIAGNOSIS — R1312 Dysphagia, oropharyngeal phase: Secondary | ICD-10-CM | POA: Diagnosis not present

## 2016-01-12 DIAGNOSIS — M6281 Muscle weakness (generalized): Secondary | ICD-10-CM | POA: Diagnosis not present

## 2016-01-12 DIAGNOSIS — M4000 Postural kyphosis, site unspecified: Secondary | ICD-10-CM | POA: Diagnosis not present

## 2016-01-13 DIAGNOSIS — A419 Sepsis, unspecified organism: Secondary | ICD-10-CM | POA: Diagnosis not present

## 2016-01-13 DIAGNOSIS — I10 Essential (primary) hypertension: Secondary | ICD-10-CM | POA: Diagnosis not present

## 2016-01-13 DIAGNOSIS — N390 Urinary tract infection, site not specified: Secondary | ICD-10-CM | POA: Diagnosis not present

## 2016-01-13 DIAGNOSIS — R1312 Dysphagia, oropharyngeal phase: Secondary | ICD-10-CM | POA: Diagnosis not present

## 2016-01-13 DIAGNOSIS — R339 Retention of urine, unspecified: Secondary | ICD-10-CM | POA: Diagnosis not present

## 2016-01-13 DIAGNOSIS — R278 Other lack of coordination: Secondary | ICD-10-CM | POA: Diagnosis not present

## 2016-01-13 DIAGNOSIS — M4000 Postural kyphosis, site unspecified: Secondary | ICD-10-CM | POA: Diagnosis not present

## 2016-01-13 DIAGNOSIS — G9340 Encephalopathy, unspecified: Secondary | ICD-10-CM | POA: Diagnosis not present

## 2016-01-13 DIAGNOSIS — M6281 Muscle weakness (generalized): Secondary | ICD-10-CM | POA: Diagnosis not present

## 2016-01-13 DIAGNOSIS — A4189 Other specified sepsis: Secondary | ICD-10-CM | POA: Diagnosis not present

## 2016-01-14 DIAGNOSIS — R278 Other lack of coordination: Secondary | ICD-10-CM | POA: Diagnosis not present

## 2016-01-14 DIAGNOSIS — G9340 Encephalopathy, unspecified: Secondary | ICD-10-CM | POA: Diagnosis not present

## 2016-01-14 DIAGNOSIS — R339 Retention of urine, unspecified: Secondary | ICD-10-CM | POA: Diagnosis not present

## 2016-01-14 DIAGNOSIS — R1312 Dysphagia, oropharyngeal phase: Secondary | ICD-10-CM | POA: Diagnosis not present

## 2016-01-14 DIAGNOSIS — M6281 Muscle weakness (generalized): Secondary | ICD-10-CM | POA: Diagnosis not present

## 2016-01-14 DIAGNOSIS — N390 Urinary tract infection, site not specified: Secondary | ICD-10-CM | POA: Diagnosis not present

## 2016-01-14 DIAGNOSIS — I10 Essential (primary) hypertension: Secondary | ICD-10-CM | POA: Diagnosis not present

## 2016-01-14 DIAGNOSIS — A419 Sepsis, unspecified organism: Secondary | ICD-10-CM | POA: Diagnosis not present

## 2016-01-14 DIAGNOSIS — M4000 Postural kyphosis, site unspecified: Secondary | ICD-10-CM | POA: Diagnosis not present

## 2016-01-14 DIAGNOSIS — A4189 Other specified sepsis: Secondary | ICD-10-CM | POA: Diagnosis not present

## 2016-01-15 DIAGNOSIS — R1312 Dysphagia, oropharyngeal phase: Secondary | ICD-10-CM | POA: Diagnosis not present

## 2016-01-15 DIAGNOSIS — E46 Unspecified protein-calorie malnutrition: Secondary | ICD-10-CM | POA: Diagnosis not present

## 2016-01-15 DIAGNOSIS — I10 Essential (primary) hypertension: Secondary | ICD-10-CM | POA: Diagnosis not present

## 2016-01-15 DIAGNOSIS — G9340 Encephalopathy, unspecified: Secondary | ICD-10-CM | POA: Diagnosis not present

## 2016-01-15 DIAGNOSIS — A419 Sepsis, unspecified organism: Secondary | ICD-10-CM | POA: Diagnosis not present

## 2016-01-15 DIAGNOSIS — A4189 Other specified sepsis: Secondary | ICD-10-CM | POA: Diagnosis not present

## 2016-01-15 DIAGNOSIS — N390 Urinary tract infection, site not specified: Secondary | ICD-10-CM | POA: Diagnosis not present

## 2016-01-15 DIAGNOSIS — M6281 Muscle weakness (generalized): Secondary | ICD-10-CM | POA: Diagnosis not present

## 2016-01-15 DIAGNOSIS — M4000 Postural kyphosis, site unspecified: Secondary | ICD-10-CM | POA: Diagnosis not present

## 2016-01-15 DIAGNOSIS — R278 Other lack of coordination: Secondary | ICD-10-CM | POA: Diagnosis not present

## 2016-01-15 DIAGNOSIS — R531 Weakness: Secondary | ICD-10-CM | POA: Diagnosis not present

## 2016-01-15 DIAGNOSIS — R339 Retention of urine, unspecified: Secondary | ICD-10-CM | POA: Diagnosis not present

## 2016-01-15 DIAGNOSIS — E119 Type 2 diabetes mellitus without complications: Secondary | ICD-10-CM | POA: Diagnosis not present

## 2016-01-16 DIAGNOSIS — N390 Urinary tract infection, site not specified: Secondary | ICD-10-CM | POA: Diagnosis not present

## 2016-01-16 DIAGNOSIS — G9340 Encephalopathy, unspecified: Secondary | ICD-10-CM | POA: Diagnosis not present

## 2016-01-16 DIAGNOSIS — I10 Essential (primary) hypertension: Secondary | ICD-10-CM | POA: Diagnosis not present

## 2016-01-16 DIAGNOSIS — A4189 Other specified sepsis: Secondary | ICD-10-CM | POA: Diagnosis not present

## 2016-01-16 DIAGNOSIS — M4000 Postural kyphosis, site unspecified: Secondary | ICD-10-CM | POA: Diagnosis not present

## 2016-01-16 DIAGNOSIS — R278 Other lack of coordination: Secondary | ICD-10-CM | POA: Diagnosis not present

## 2016-01-16 DIAGNOSIS — R339 Retention of urine, unspecified: Secondary | ICD-10-CM | POA: Diagnosis not present

## 2016-01-16 DIAGNOSIS — A419 Sepsis, unspecified organism: Secondary | ICD-10-CM | POA: Diagnosis not present

## 2016-01-16 DIAGNOSIS — M6281 Muscle weakness (generalized): Secondary | ICD-10-CM | POA: Diagnosis not present

## 2016-01-16 DIAGNOSIS — R1312 Dysphagia, oropharyngeal phase: Secondary | ICD-10-CM | POA: Diagnosis not present

## 2016-01-18 DIAGNOSIS — M4000 Postural kyphosis, site unspecified: Secondary | ICD-10-CM | POA: Diagnosis not present

## 2016-01-18 DIAGNOSIS — M6281 Muscle weakness (generalized): Secondary | ICD-10-CM | POA: Diagnosis not present

## 2016-01-18 DIAGNOSIS — N390 Urinary tract infection, site not specified: Secondary | ICD-10-CM | POA: Diagnosis not present

## 2016-01-18 DIAGNOSIS — R339 Retention of urine, unspecified: Secondary | ICD-10-CM | POA: Diagnosis not present

## 2016-01-18 DIAGNOSIS — A419 Sepsis, unspecified organism: Secondary | ICD-10-CM | POA: Diagnosis not present

## 2016-01-18 DIAGNOSIS — A4189 Other specified sepsis: Secondary | ICD-10-CM | POA: Diagnosis not present

## 2016-01-18 DIAGNOSIS — I10 Essential (primary) hypertension: Secondary | ICD-10-CM | POA: Diagnosis not present

## 2016-01-18 DIAGNOSIS — R1312 Dysphagia, oropharyngeal phase: Secondary | ICD-10-CM | POA: Diagnosis not present

## 2016-01-18 DIAGNOSIS — R278 Other lack of coordination: Secondary | ICD-10-CM | POA: Diagnosis not present

## 2016-01-18 DIAGNOSIS — G9340 Encephalopathy, unspecified: Secondary | ICD-10-CM | POA: Diagnosis not present

## 2016-01-19 DIAGNOSIS — A4189 Other specified sepsis: Secondary | ICD-10-CM | POA: Diagnosis not present

## 2016-01-19 DIAGNOSIS — G9340 Encephalopathy, unspecified: Secondary | ICD-10-CM | POA: Diagnosis not present

## 2016-01-19 DIAGNOSIS — M6281 Muscle weakness (generalized): Secondary | ICD-10-CM | POA: Diagnosis not present

## 2016-01-19 DIAGNOSIS — A419 Sepsis, unspecified organism: Secondary | ICD-10-CM | POA: Diagnosis not present

## 2016-01-19 DIAGNOSIS — N390 Urinary tract infection, site not specified: Secondary | ICD-10-CM | POA: Diagnosis not present

## 2016-01-19 DIAGNOSIS — R278 Other lack of coordination: Secondary | ICD-10-CM | POA: Diagnosis not present

## 2016-01-19 DIAGNOSIS — I10 Essential (primary) hypertension: Secondary | ICD-10-CM | POA: Diagnosis not present

## 2016-01-19 DIAGNOSIS — M4000 Postural kyphosis, site unspecified: Secondary | ICD-10-CM | POA: Diagnosis not present

## 2016-01-19 DIAGNOSIS — E088 Diabetes mellitus due to underlying condition with unspecified complications: Secondary | ICD-10-CM | POA: Diagnosis not present

## 2016-01-19 DIAGNOSIS — R1312 Dysphagia, oropharyngeal phase: Secondary | ICD-10-CM | POA: Diagnosis not present

## 2016-01-19 DIAGNOSIS — R339 Retention of urine, unspecified: Secondary | ICD-10-CM | POA: Diagnosis not present

## 2016-01-20 DIAGNOSIS — A4189 Other specified sepsis: Secondary | ICD-10-CM | POA: Diagnosis not present

## 2016-01-20 DIAGNOSIS — I10 Essential (primary) hypertension: Secondary | ICD-10-CM | POA: Diagnosis not present

## 2016-01-20 DIAGNOSIS — M4000 Postural kyphosis, site unspecified: Secondary | ICD-10-CM | POA: Diagnosis not present

## 2016-01-20 DIAGNOSIS — A419 Sepsis, unspecified organism: Secondary | ICD-10-CM | POA: Diagnosis not present

## 2016-01-20 DIAGNOSIS — E1122 Type 2 diabetes mellitus with diabetic chronic kidney disease: Secondary | ICD-10-CM | POA: Diagnosis not present

## 2016-01-20 DIAGNOSIS — G9340 Encephalopathy, unspecified: Secondary | ICD-10-CM | POA: Diagnosis not present

## 2016-01-20 DIAGNOSIS — R278 Other lack of coordination: Secondary | ICD-10-CM | POA: Diagnosis not present

## 2016-01-20 DIAGNOSIS — I129 Hypertensive chronic kidney disease with stage 1 through stage 4 chronic kidney disease, or unspecified chronic kidney disease: Secondary | ICD-10-CM | POA: Diagnosis not present

## 2016-01-20 DIAGNOSIS — N189 Chronic kidney disease, unspecified: Secondary | ICD-10-CM | POA: Diagnosis not present

## 2016-01-20 DIAGNOSIS — R809 Proteinuria, unspecified: Secondary | ICD-10-CM | POA: Diagnosis not present

## 2016-01-20 DIAGNOSIS — N390 Urinary tract infection, site not specified: Secondary | ICD-10-CM | POA: Diagnosis not present

## 2016-01-20 DIAGNOSIS — R1312 Dysphagia, oropharyngeal phase: Secondary | ICD-10-CM | POA: Diagnosis not present

## 2016-01-20 DIAGNOSIS — R339 Retention of urine, unspecified: Secondary | ICD-10-CM | POA: Diagnosis not present

## 2016-01-20 DIAGNOSIS — M6281 Muscle weakness (generalized): Secondary | ICD-10-CM | POA: Diagnosis not present

## 2016-01-21 DIAGNOSIS — G9340 Encephalopathy, unspecified: Secondary | ICD-10-CM | POA: Diagnosis not present

## 2016-01-21 DIAGNOSIS — R339 Retention of urine, unspecified: Secondary | ICD-10-CM | POA: Diagnosis not present

## 2016-01-21 DIAGNOSIS — R1312 Dysphagia, oropharyngeal phase: Secondary | ICD-10-CM | POA: Diagnosis not present

## 2016-01-21 DIAGNOSIS — N390 Urinary tract infection, site not specified: Secondary | ICD-10-CM | POA: Diagnosis not present

## 2016-01-21 DIAGNOSIS — A4189 Other specified sepsis: Secondary | ICD-10-CM | POA: Diagnosis not present

## 2016-01-21 DIAGNOSIS — R278 Other lack of coordination: Secondary | ICD-10-CM | POA: Diagnosis not present

## 2016-01-21 DIAGNOSIS — M6281 Muscle weakness (generalized): Secondary | ICD-10-CM | POA: Diagnosis not present

## 2016-01-21 DIAGNOSIS — A419 Sepsis, unspecified organism: Secondary | ICD-10-CM | POA: Diagnosis not present

## 2016-01-21 DIAGNOSIS — I10 Essential (primary) hypertension: Secondary | ICD-10-CM | POA: Diagnosis not present

## 2016-01-21 DIAGNOSIS — M4000 Postural kyphosis, site unspecified: Secondary | ICD-10-CM | POA: Diagnosis not present

## 2016-01-22 DIAGNOSIS — N390 Urinary tract infection, site not specified: Secondary | ICD-10-CM | POA: Diagnosis not present

## 2016-01-22 DIAGNOSIS — R1312 Dysphagia, oropharyngeal phase: Secondary | ICD-10-CM | POA: Diagnosis not present

## 2016-01-22 DIAGNOSIS — G9340 Encephalopathy, unspecified: Secondary | ICD-10-CM | POA: Diagnosis not present

## 2016-01-22 DIAGNOSIS — R278 Other lack of coordination: Secondary | ICD-10-CM | POA: Diagnosis not present

## 2016-01-22 DIAGNOSIS — R339 Retention of urine, unspecified: Secondary | ICD-10-CM | POA: Diagnosis not present

## 2016-01-22 DIAGNOSIS — A4189 Other specified sepsis: Secondary | ICD-10-CM | POA: Diagnosis not present

## 2016-01-22 DIAGNOSIS — M6281 Muscle weakness (generalized): Secondary | ICD-10-CM | POA: Diagnosis not present

## 2016-01-22 DIAGNOSIS — A419 Sepsis, unspecified organism: Secondary | ICD-10-CM | POA: Diagnosis not present

## 2016-01-22 DIAGNOSIS — M4000 Postural kyphosis, site unspecified: Secondary | ICD-10-CM | POA: Diagnosis not present

## 2016-01-22 DIAGNOSIS — I10 Essential (primary) hypertension: Secondary | ICD-10-CM | POA: Diagnosis not present

## 2016-01-23 DIAGNOSIS — R339 Retention of urine, unspecified: Secondary | ICD-10-CM | POA: Diagnosis not present

## 2016-01-23 DIAGNOSIS — A4189 Other specified sepsis: Secondary | ICD-10-CM | POA: Diagnosis not present

## 2016-01-23 DIAGNOSIS — A419 Sepsis, unspecified organism: Secondary | ICD-10-CM | POA: Diagnosis not present

## 2016-01-23 DIAGNOSIS — M6281 Muscle weakness (generalized): Secondary | ICD-10-CM | POA: Diagnosis not present

## 2016-01-23 DIAGNOSIS — R1312 Dysphagia, oropharyngeal phase: Secondary | ICD-10-CM | POA: Diagnosis not present

## 2016-01-23 DIAGNOSIS — G9340 Encephalopathy, unspecified: Secondary | ICD-10-CM | POA: Diagnosis not present

## 2016-01-23 DIAGNOSIS — I10 Essential (primary) hypertension: Secondary | ICD-10-CM | POA: Diagnosis not present

## 2016-01-23 DIAGNOSIS — N390 Urinary tract infection, site not specified: Secondary | ICD-10-CM | POA: Diagnosis not present

## 2016-01-23 DIAGNOSIS — R278 Other lack of coordination: Secondary | ICD-10-CM | POA: Diagnosis not present

## 2016-01-23 DIAGNOSIS — M4000 Postural kyphosis, site unspecified: Secondary | ICD-10-CM | POA: Diagnosis not present

## 2016-01-24 DIAGNOSIS — M4000 Postural kyphosis, site unspecified: Secondary | ICD-10-CM | POA: Diagnosis not present

## 2016-01-24 DIAGNOSIS — A419 Sepsis, unspecified organism: Secondary | ICD-10-CM | POA: Diagnosis not present

## 2016-01-24 DIAGNOSIS — R339 Retention of urine, unspecified: Secondary | ICD-10-CM | POA: Diagnosis not present

## 2016-01-24 DIAGNOSIS — R278 Other lack of coordination: Secondary | ICD-10-CM | POA: Diagnosis not present

## 2016-01-24 DIAGNOSIS — R1312 Dysphagia, oropharyngeal phase: Secondary | ICD-10-CM | POA: Diagnosis not present

## 2016-01-24 DIAGNOSIS — A4189 Other specified sepsis: Secondary | ICD-10-CM | POA: Diagnosis not present

## 2016-01-24 DIAGNOSIS — G9340 Encephalopathy, unspecified: Secondary | ICD-10-CM | POA: Diagnosis not present

## 2016-01-24 DIAGNOSIS — I10 Essential (primary) hypertension: Secondary | ICD-10-CM | POA: Diagnosis not present

## 2016-01-24 DIAGNOSIS — M6281 Muscle weakness (generalized): Secondary | ICD-10-CM | POA: Diagnosis not present

## 2016-01-24 DIAGNOSIS — N390 Urinary tract infection, site not specified: Secondary | ICD-10-CM | POA: Diagnosis not present

## 2016-01-26 DIAGNOSIS — R1312 Dysphagia, oropharyngeal phase: Secondary | ICD-10-CM | POA: Diagnosis not present

## 2016-01-26 DIAGNOSIS — I10 Essential (primary) hypertension: Secondary | ICD-10-CM | POA: Diagnosis not present

## 2016-01-26 DIAGNOSIS — A419 Sepsis, unspecified organism: Secondary | ICD-10-CM | POA: Diagnosis not present

## 2016-01-26 DIAGNOSIS — R339 Retention of urine, unspecified: Secondary | ICD-10-CM | POA: Diagnosis not present

## 2016-01-26 DIAGNOSIS — N390 Urinary tract infection, site not specified: Secondary | ICD-10-CM | POA: Diagnosis not present

## 2016-01-26 DIAGNOSIS — A4189 Other specified sepsis: Secondary | ICD-10-CM | POA: Diagnosis not present

## 2016-01-26 DIAGNOSIS — G9340 Encephalopathy, unspecified: Secondary | ICD-10-CM | POA: Diagnosis not present

## 2016-01-26 DIAGNOSIS — M4000 Postural kyphosis, site unspecified: Secondary | ICD-10-CM | POA: Diagnosis not present

## 2016-01-26 DIAGNOSIS — R278 Other lack of coordination: Secondary | ICD-10-CM | POA: Diagnosis not present

## 2016-01-26 DIAGNOSIS — M6281 Muscle weakness (generalized): Secondary | ICD-10-CM | POA: Diagnosis not present

## 2016-01-27 DIAGNOSIS — I10 Essential (primary) hypertension: Secondary | ICD-10-CM | POA: Diagnosis not present

## 2016-01-27 DIAGNOSIS — A4189 Other specified sepsis: Secondary | ICD-10-CM | POA: Diagnosis not present

## 2016-01-27 DIAGNOSIS — M4000 Postural kyphosis, site unspecified: Secondary | ICD-10-CM | POA: Diagnosis not present

## 2016-01-27 DIAGNOSIS — M6281 Muscle weakness (generalized): Secondary | ICD-10-CM | POA: Diagnosis not present

## 2016-01-27 DIAGNOSIS — N390 Urinary tract infection, site not specified: Secondary | ICD-10-CM | POA: Diagnosis not present

## 2016-01-27 DIAGNOSIS — R278 Other lack of coordination: Secondary | ICD-10-CM | POA: Diagnosis not present

## 2016-01-27 DIAGNOSIS — R339 Retention of urine, unspecified: Secondary | ICD-10-CM | POA: Diagnosis not present

## 2016-01-27 DIAGNOSIS — R1312 Dysphagia, oropharyngeal phase: Secondary | ICD-10-CM | POA: Diagnosis not present

## 2016-01-27 DIAGNOSIS — A419 Sepsis, unspecified organism: Secondary | ICD-10-CM | POA: Diagnosis not present

## 2016-01-27 DIAGNOSIS — G9340 Encephalopathy, unspecified: Secondary | ICD-10-CM | POA: Diagnosis not present

## 2016-01-28 DIAGNOSIS — A4189 Other specified sepsis: Secondary | ICD-10-CM | POA: Diagnosis not present

## 2016-01-28 DIAGNOSIS — A419 Sepsis, unspecified organism: Secondary | ICD-10-CM | POA: Diagnosis not present

## 2016-01-28 DIAGNOSIS — R278 Other lack of coordination: Secondary | ICD-10-CM | POA: Diagnosis not present

## 2016-01-28 DIAGNOSIS — M6281 Muscle weakness (generalized): Secondary | ICD-10-CM | POA: Diagnosis not present

## 2016-01-28 DIAGNOSIS — I10 Essential (primary) hypertension: Secondary | ICD-10-CM | POA: Diagnosis not present

## 2016-01-28 DIAGNOSIS — N390 Urinary tract infection, site not specified: Secondary | ICD-10-CM | POA: Diagnosis not present

## 2016-01-28 DIAGNOSIS — M4000 Postural kyphosis, site unspecified: Secondary | ICD-10-CM | POA: Diagnosis not present

## 2016-01-28 DIAGNOSIS — R1312 Dysphagia, oropharyngeal phase: Secondary | ICD-10-CM | POA: Diagnosis not present

## 2016-01-28 DIAGNOSIS — R339 Retention of urine, unspecified: Secondary | ICD-10-CM | POA: Diagnosis not present

## 2016-01-28 DIAGNOSIS — G9340 Encephalopathy, unspecified: Secondary | ICD-10-CM | POA: Diagnosis not present

## 2016-01-29 DIAGNOSIS — R339 Retention of urine, unspecified: Secondary | ICD-10-CM | POA: Diagnosis not present

## 2016-01-29 DIAGNOSIS — R278 Other lack of coordination: Secondary | ICD-10-CM | POA: Diagnosis not present

## 2016-01-29 DIAGNOSIS — N390 Urinary tract infection, site not specified: Secondary | ICD-10-CM | POA: Diagnosis not present

## 2016-01-29 DIAGNOSIS — E119 Type 2 diabetes mellitus without complications: Secondary | ICD-10-CM | POA: Diagnosis not present

## 2016-01-29 DIAGNOSIS — E46 Unspecified protein-calorie malnutrition: Secondary | ICD-10-CM | POA: Diagnosis not present

## 2016-01-29 DIAGNOSIS — R1312 Dysphagia, oropharyngeal phase: Secondary | ICD-10-CM | POA: Diagnosis not present

## 2016-01-29 DIAGNOSIS — M6281 Muscle weakness (generalized): Secondary | ICD-10-CM | POA: Diagnosis not present

## 2016-01-29 DIAGNOSIS — R630 Anorexia: Secondary | ICD-10-CM | POA: Diagnosis not present

## 2016-01-29 DIAGNOSIS — M4000 Postural kyphosis, site unspecified: Secondary | ICD-10-CM | POA: Diagnosis not present

## 2016-01-29 DIAGNOSIS — R531 Weakness: Secondary | ICD-10-CM | POA: Diagnosis not present

## 2016-01-29 DIAGNOSIS — I10 Essential (primary) hypertension: Secondary | ICD-10-CM | POA: Diagnosis not present

## 2016-01-29 DIAGNOSIS — A4189 Other specified sepsis: Secondary | ICD-10-CM | POA: Diagnosis not present

## 2016-01-29 DIAGNOSIS — G9340 Encephalopathy, unspecified: Secondary | ICD-10-CM | POA: Diagnosis not present

## 2016-01-29 DIAGNOSIS — A419 Sepsis, unspecified organism: Secondary | ICD-10-CM | POA: Diagnosis not present

## 2016-01-30 DIAGNOSIS — M6281 Muscle weakness (generalized): Secondary | ICD-10-CM | POA: Diagnosis not present

## 2016-01-30 DIAGNOSIS — R278 Other lack of coordination: Secondary | ICD-10-CM | POA: Diagnosis not present

## 2016-01-30 DIAGNOSIS — G9340 Encephalopathy, unspecified: Secondary | ICD-10-CM | POA: Diagnosis not present

## 2016-02-17 ENCOUNTER — Encounter (HOSPITAL_COMMUNITY): Payer: Self-pay

## 2016-02-17 ENCOUNTER — Emergency Department (HOSPITAL_COMMUNITY)
Admission: EM | Admit: 2016-02-17 | Discharge: 2016-02-17 | Disposition: A | Discharge status: Home / Self Care | Payer: Medicare Other | Attending: Emergency Medicine | Admitting: Emergency Medicine

## 2016-02-17 ENCOUNTER — Emergency Department (HOSPITAL_COMMUNITY): Payer: Medicare Other

## 2016-02-17 DIAGNOSIS — S0033XA Contusion of nose, initial encounter: Secondary | ICD-10-CM | POA: Diagnosis not present

## 2016-02-17 DIAGNOSIS — S0083XA Contusion of other part of head, initial encounter: Secondary | ICD-10-CM | POA: Diagnosis not present

## 2016-02-17 DIAGNOSIS — Z79899 Other long term (current) drug therapy: Secondary | ICD-10-CM | POA: Insufficient documentation

## 2016-02-17 DIAGNOSIS — Y939 Activity, unspecified: Secondary | ICD-10-CM | POA: Diagnosis not present

## 2016-02-17 DIAGNOSIS — Y999 Unspecified external cause status: Secondary | ICD-10-CM | POA: Insufficient documentation

## 2016-02-17 DIAGNOSIS — I10 Essential (primary) hypertension: Secondary | ICD-10-CM | POA: Insufficient documentation

## 2016-02-17 DIAGNOSIS — Y92129 Unspecified place in nursing home as the place of occurrence of the external cause: Secondary | ICD-10-CM | POA: Insufficient documentation

## 2016-02-17 DIAGNOSIS — E119 Type 2 diabetes mellitus without complications: Secondary | ICD-10-CM | POA: Insufficient documentation

## 2016-02-17 DIAGNOSIS — Z794 Long term (current) use of insulin: Secondary | ICD-10-CM | POA: Insufficient documentation

## 2016-02-17 DIAGNOSIS — S0181XA Laceration without foreign body of other part of head, initial encounter: Secondary | ICD-10-CM | POA: Diagnosis not present

## 2016-02-17 DIAGNOSIS — S0990XA Unspecified injury of head, initial encounter: Secondary | ICD-10-CM | POA: Diagnosis not present

## 2016-02-17 DIAGNOSIS — W19XXXA Unspecified fall, initial encounter: Secondary | ICD-10-CM

## 2016-02-17 DIAGNOSIS — W06XXXA Fall from bed, initial encounter: Secondary | ICD-10-CM | POA: Insufficient documentation

## 2016-02-17 DIAGNOSIS — S0081XA Abrasion of other part of head, initial encounter: Secondary | ICD-10-CM

## 2016-02-17 DIAGNOSIS — S0993XA Unspecified injury of face, initial encounter: Secondary | ICD-10-CM | POA: Diagnosis not present

## 2016-02-17 DIAGNOSIS — S199XXA Unspecified injury of neck, initial encounter: Secondary | ICD-10-CM | POA: Diagnosis not present

## 2016-02-17 NOTE — ED Notes (Signed)
Pt transported to CT ?

## 2016-02-17 NOTE — ED Notes (Signed)
[facility] called and discharge report given to staff. They verbalize they will be getting patient around 3:00

## 2016-02-17 NOTE — ED Provider Notes (Signed)
[facility]-EMER[facility]ENCY DEPT Provider Note   CSN: [id number] Arrival date & time: 02/17/16  1014    History   Chief Complaint Chief Complaint  Patient presents with  . Fall    facial injury   HPI Comments: [name] is a 60 y.o. male who presents to the Emergency Department complaining of Facial trauma  HPI Patient presents after mechanical fall from his bed yesterday at nursing facility. Per the father and mother mother were contacted yesterday by the facility stating that they had witnessed the patient's fall. There is no reported loss of consciousness at that time. The facility was instructed to monitor the patient and if there was no change in his mental status not to seek medical evaluation. Patient was reassessed this morning by the facility who felt the need to contact EMS to bring the patient here for evaluation.  Father reports that the patient is at his baseline mental status. Patient is not complaining of any acute injuries. Past Medical History:  Diagnosis Date  . Diabetes mellitus   . DKA (diabetic ketoacidoses) (HCC)   . Gram-positive bacteremia   . Hypernatremia   . Hypertension   . Hypokalemia   . MR (mental retardation)   . Renal disorder   . Sepsis(995.91)   . UTI (urinary tract infection)     Patient Active Problem List   Diagnosis Date Noted  . Sepsis (HCC) 11/02/2015  . UTI (urinary tract infection) 11/02/2015  . Sinus tachycardia (HCC) 11/02/2015  . Dehydration 11/02/2015  . Leukocytosis 11/02/2015  . DM type 2 causing eye disease, not at goal [facility]) 11/02/2015  . Hypokalemia 03/04/2014  . Bacteremia due to Gram-positive bacteria 06/03/2011  . Hypernatremia 06/03/2011  . Hyperkalemia 05/29/2011  . DKA (diabetic ketoacidoses) (HCC) 05/29/2011  . Renal failure 05/29/2011  . UTI (lower urinary tract infection) 05/29/2011    History reviewed. No pertinent surgical history.     Home Medications    Prior to Admission medications   Medication Sig  Start Date End Date Taking? Authorizing Provider  acetaminophen (TYLENOL) 500 MG tablet Take 500 mg by mouth every 6 (six) hours as needed.   Yes Historical Provider, MD  Alum & Mag Hydroxide-Simeth (ANTACID & ANTIGAS) 200-200-20 MG/5ML SUSP Take 30 mLs by mouth daily as needed (heartburn).   Yes Historical Provider, MD  insulin glargine (LANTUS) 100 UNIT/ML injection Inject 0.1 mLs (10 Units total) into the skin at bedtime. Patient taking differently: Inject 15 Units into the skin at bedtime.  11/11/15  Yes Standley Brooking, MD  insulin lispro (HUMALOG) 100 UNIT/ML injection Inject 2-12 Units into the skin 3 (three) times daily. Sliding scale. 150-200=2 units, 201-250-4 units, 251-300=6 units, 301-350=8 units, 351-400=10 units, 401-450=12 units   Yes Historical Provider, MD  labetalol (NORMODYNE) 300 MG tablet Take 300 mg by mouth 2 (two) times daily.  10/30/15  Yes Historical Provider, MD  levothyroxine (SYNTHROID, LEVOTHROID) 50 MCG tablet Take 50 mcg by mouth daily before breakfast.   Yes Historical Provider, MD  magnesium hydroxide (MILK OF MAGNESIA) 400 MG/5ML suspension Take 30 mLs by mouth daily as needed for mild constipation.   Yes Historical Provider, MD  Potassium Chloride 40 MEQ/15ML (20%) SOLN Take 7.5 mLs by mouth daily.   Yes Historical Provider, MD  tamsulosin (FLOMAX) 0.4 MG CAPS capsule Take 0.4 mg by mouth every other day.  10/30/15  Yes Historical Provider, MD  tolterodine (DETROL) 1 MG tablet Take 1 mg by mouth daily as needed (bladder).  Yes Historical Provider, MD    Family History No family history on file.  Social History Social History  Substance Use Topics  . Smoking status: Never Smoker  . Smokeless tobacco: Never Used  . Alcohol use No     Allergies   Review of patient's allergies indicates no known allergies.   Review of Systems Review of Systems  Unable to perform ROS: Other (developmental delay)     Physical Exam Updated Vital Signs BP 140/76 (BP  Location: Left Arm)   Pulse 86   Temp 98 F (36.7 C) (Oral)   Resp 18   SpO2 96%   Physical Exam  Constitutional: He is oriented to person, place, and time. He appears well-developed and well-nourished. No distress.  HENT:  Head: Normocephalic. Head is with abrasion. Head contusion: on forehead and bridge of nose.  Right Ear: External ear normal.  Left Ear: External ear normal.  Mouth/Throat: Oropharynx is clear and moist.  Eyes: Conjunctivae and EOM are normal. Pupils are equal, round, and reactive to light. Right eye exhibits no discharge. Left eye exhibits no discharge. No scleral icterus.  Neck: Normal range of motion. Neck supple.  Cardiovascular: Regular rhythm and normal heart sounds.  Exam reveals no gallop and no friction rub.   No murmur heard. Pulses:      Radial pulses are 2+ on the right side, and 2+ on the left side.       Dorsalis pedis pulses are 2+ on the right side, and 2+ on the left side.  Pulmonary/Chest: Effort normal and breath sounds normal. No stridor. No respiratory distress.  Abdominal: Soft. He exhibits no distension. There is no tenderness.  Musculoskeletal:       Cervical back: He exhibits no bony tenderness.       Thoracic back: He exhibits no bony tenderness.       Lumbar back: He exhibits no bony tenderness.  Clavicle stable. Chest stable to AP/Lat compression. Pelvis stable to Lat compression. No obvious extremity deformity.   Neurological: He is alert and oriented to person, place, and time. GCS eye subscore is 4. GCS verbal subscore is 5. GCS motor subscore is 6.  Moving all extremities   Skin: Skin is warm. He is not diaphoretic.     ED Treatments / Results   DIAGNOSTIC STUDIES: Oxygen Saturation is 98% on RA, normal by my interpretation.    COORDINATION OF CARE: 1:08 PM Discussed treatment plan with The patient and his father at bedside which includes CT imaging and they agreed to plan.   Labs (all labs ordered are listed, but  only abnormal results are displayed) Labs Reviewed - No data to display  EKG  EKG Interpretation None       Radiology Ct Head Wo Contrast  Result Date: 02/17/2016 CLINICAL DATA:  Recent fall EXAM: CT HEAD WITHOUT CONTRAST CT MAXILLOFACIAL WITHOUT CONTRAST CT CERVICAL SPINE WITHOUT CONTRAST TECHNIQUE: Multidetector CT imaging of the head, cervical spine, and maxillofacial structures were performed using the standard protocol without intravenous contrast. Multiplanar CT image reconstructions of the cervical spine and maxillofacial structures were also generated. COMPARISON:  11/06/2015 FINDINGS: CT HEAD FINDINGS Brain: No evidence of acute infarction, hemorrhage, hydrocephalus, extra-axial collection or mass lesion/mass effect. Marked volume loss is noted in the left cerebellar hemisphere similar to that seen on the prior exam. Diffuse atrophic changes and chronic white matter ischemic changes are seen. Vascular: No hyperdense vessel or unexpected calcification. Skull: Normal. Negative for fracture or focal lesion. Sinuses/Orbits:  No acute finding. Other: None CT MAXILLOFACIAL FINDINGS Osseous: No fracture or mandibular dislocation. No destructive process. Orbits: Negative. No traumatic or inflammatory finding. Sinuses: Clear. Soft tissues: Negative. Limited intracranial: Diffuse atrophic changes are again noted. CT CERVICAL SPINE FINDINGS Alignment: Normal. Skull base and vertebrae: No acute fracture. No primary bone lesion or focal pathologic process. Soft tissues and spinal canal: No prevertebral fluid or swelling. No visible canal hematoma. Disc levels:  No significant disc pathology is noted. Upper chest: Negative. Other: None IMPRESSION: CT of the head: Diffuse atrophic and chronic white matter ischemic changes. No acute intracranial abnormality is noted. CT of the maxillofacial bones: No acute fracture is noted. No gross soft tissue abnormality is seen. CT of the cervical spine:  No acute  abnormality is noted. Electronically Signed   By: Alcide CleverMark  Lukens M.D.   On: 02/17/2016 12:05   Ct Cervical Spine Wo Contrast  Result Date: 02/17/2016 CLINICAL DATA:  Recent fall EXAM: CT HEAD WITHOUT CONTRAST CT MAXILLOFACIAL WITHOUT CONTRAST CT CERVICAL SPINE WITHOUT CONTRAST TECHNIQUE: Multidetector CT imaging of the head, cervical spine, and maxillofacial structures were performed using the standard protocol without intravenous contrast. Multiplanar CT image reconstructions of the cervical spine and maxillofacial structures were also generated. COMPARISON:  11/06/2015 FINDINGS: CT HEAD FINDINGS Brain: No evidence of acute infarction, hemorrhage, hydrocephalus, extra-axial collection or mass lesion/mass effect. Marked volume loss is noted in the left cerebellar hemisphere similar to that seen on the prior exam. Diffuse atrophic changes and chronic white matter ischemic changes are seen. Vascular: No hyperdense vessel or unexpected calcification. Skull: Normal. Negative for fracture or focal lesion. Sinuses/Orbits: No acute finding. Other: None CT MAXILLOFACIAL FINDINGS Osseous: No fracture or mandibular dislocation. No destructive process. Orbits: Negative. No traumatic or inflammatory finding. Sinuses: Clear. Soft tissues: Negative. Limited intracranial: Diffuse atrophic changes are again noted. CT CERVICAL SPINE FINDINGS Alignment: Normal. Skull base and vertebrae: No acute fracture. No primary bone lesion or focal pathologic process. Soft tissues and spinal canal: No prevertebral fluid or swelling. No visible canal hematoma. Disc levels:  No significant disc pathology is noted. Upper chest: Negative. Other: None IMPRESSION: CT of the head: Diffuse atrophic and chronic white matter ischemic changes. No acute intracranial abnormality is noted. CT of the maxillofacial bones: No acute fracture is noted. No gross soft tissue abnormality is seen. CT of the cervical spine:  No acute abnormality is noted.  Electronically Signed   By: Alcide CleverMark  Lukens M.D.   On: 02/17/2016 12:05   Ct Maxillofacial Wo Contrast  Result Date: 02/17/2016 CLINICAL DATA:  Recent fall EXAM: CT HEAD WITHOUT CONTRAST CT MAXILLOFACIAL WITHOUT CONTRAST CT CERVICAL SPINE WITHOUT CONTRAST TECHNIQUE: Multidetector CT imaging of the head, cervical spine, and maxillofacial structures were performed using the standard protocol without intravenous contrast. Multiplanar CT image reconstructions of the cervical spine and maxillofacial structures were also generated. COMPARISON:  11/06/2015 FINDINGS: CT HEAD FINDINGS Brain: No evidence of acute infarction, hemorrhage, hydrocephalus, extra-axial collection or mass lesion/mass effect. Marked volume loss is noted in the left cerebellar hemisphere similar to that seen on the prior exam. Diffuse atrophic changes and chronic white matter ischemic changes are seen. Vascular: No hyperdense vessel or unexpected calcification. Skull: Normal. Negative for fracture or focal lesion. Sinuses/Orbits: No acute finding. Other: None CT MAXILLOFACIAL FINDINGS Osseous: No fracture or mandibular dislocation. No destructive process. Orbits: Negative. No traumatic or inflammatory finding. Sinuses: Clear. Soft tissues: Negative. Limited intracranial: Diffuse atrophic changes are again noted. CT CERVICAL SPINE FINDINGS Alignment: Normal.  Skull base and vertebrae: No acute fracture. No primary bone lesion or focal pathologic process. Soft tissues and spinal canal: No prevertebral fluid or swelling. No visible canal hematoma. Disc levels:  No significant disc pathology is noted. Upper chest: Negative. Other: None IMPRESSION: CT of the head: Diffuse atrophic and chronic white matter ischemic changes. No acute intracranial abnormality is noted. CT of the maxillofacial bones: No acute fracture is noted. No gross soft tissue abnormality is seen. CT of the cervical spine:  No acute abnormality is noted. Electronically Signed   By: Alcide Clever M.D.   On: 02/17/2016 12:05    Procedures Procedures (including critical care time)  Medications Ordered in ED Medications - No data to display   Initial Impression / Assessment and Plan / ED Course  I have reviewed the triage vital signs and the nursing notes.  Pertinent labs & imaging results that were available during my care of the patient were reviewed by me and considered in my medical decision making (see chart for details).  Clinical Course    Mechanical fall from bed with facial trauma. Patient is currently at baseline. No reported blood thinners. CT head, cervical spine, face without any acute abnormalities. Patient monitored for several hours with no change in mental status.  The patient is safe for discharge with strict return precautions.  Final Clinical Impressions(s) / ED Diagnoses   Final diagnoses:  Fall, initial encounter  Facial contusion, initial encounter  Facial abrasion, initial encounter   Disposition: Discharge  Condition: Good  I have discussed the results, Dx and Tx plan with the patient and father who expressed understanding and agree(s) with the plan. Discharge instructions discussed at great length. The patient and father was given strict return precautions who verbalized understanding of the instructions. No further questions at time of discharge.    Current Discharge Medication List      Follow Up: Elfredia Nevins, MD [address] [place] [address] [phone]  Call  As needed      Nira Conn, MD 02/17/16 1310

## 2016-02-17 NOTE — ED Notes (Signed)
Pt in hallway bed with continuous observation.

## 2016-02-17 NOTE — ED Triage Notes (Signed)
[facility]. Pt has abrasions to forehead and bridge of nose. Pt unable to say how he fell

## 2016-02-29 DIAGNOSIS — R278 Other lack of coordination: Secondary | ICD-10-CM | POA: Diagnosis not present

## 2016-02-29 DIAGNOSIS — M6281 Muscle weakness (generalized): Secondary | ICD-10-CM | POA: Diagnosis not present

## 2016-02-29 DIAGNOSIS — G9340 Encephalopathy, unspecified: Secondary | ICD-10-CM | POA: Diagnosis not present

## 2016-03-02 DIAGNOSIS — S0081XD Abrasion of other part of head, subsequent encounter: Secondary | ICD-10-CM | POA: Diagnosis not present

## 2016-03-02 DIAGNOSIS — S0083XD Contusion of other part of head, subsequent encounter: Secondary | ICD-10-CM | POA: Diagnosis not present

## 2016-03-02 DIAGNOSIS — Z1389 Encounter for screening for other disorder: Secondary | ICD-10-CM | POA: Diagnosis not present

## 2016-03-02 DIAGNOSIS — E119 Type 2 diabetes mellitus without complications: Secondary | ICD-10-CM | POA: Diagnosis not present

## 2016-03-02 DIAGNOSIS — W19XXXS Unspecified fall, sequela: Secondary | ICD-10-CM | POA: Diagnosis not present

## 2016-03-05 DIAGNOSIS — E1142 Type 2 diabetes mellitus with diabetic polyneuropathy: Secondary | ICD-10-CM | POA: Diagnosis not present

## 2016-03-05 DIAGNOSIS — L603 Nail dystrophy: Secondary | ICD-10-CM | POA: Diagnosis not present

## 2016-03-30 ENCOUNTER — Ambulatory Visit: Payer: Medicare Other | Admitting: Urology

## 2016-03-31 DIAGNOSIS — M6281 Muscle weakness (generalized): Secondary | ICD-10-CM | POA: Diagnosis not present

## 2016-03-31 DIAGNOSIS — R278 Other lack of coordination: Secondary | ICD-10-CM | POA: Diagnosis not present

## 2016-03-31 DIAGNOSIS — G9340 Encephalopathy, unspecified: Secondary | ICD-10-CM | POA: Diagnosis not present

## 2016-04-14 DIAGNOSIS — M810 Age-related osteoporosis without current pathological fracture: Secondary | ICD-10-CM | POA: Diagnosis not present

## 2016-04-14 DIAGNOSIS — E119 Type 2 diabetes mellitus without complications: Secondary | ICD-10-CM | POA: Diagnosis not present

## 2016-04-14 DIAGNOSIS — Z0000 Encounter for general adult medical examination without abnormal findings: Secondary | ICD-10-CM | POA: Diagnosis not present

## 2016-04-14 DIAGNOSIS — H2513 Age-related nuclear cataract, bilateral: Secondary | ICD-10-CM | POA: Diagnosis not present

## 2016-04-14 DIAGNOSIS — E063 Autoimmune thyroiditis: Secondary | ICD-10-CM | POA: Diagnosis not present

## 2016-04-14 DIAGNOSIS — I10 Essential (primary) hypertension: Secondary | ICD-10-CM | POA: Diagnosis not present

## 2016-04-15 DIAGNOSIS — E063 Autoimmune thyroiditis: Secondary | ICD-10-CM | POA: Diagnosis not present

## 2016-04-15 DIAGNOSIS — Z0000 Encounter for general adult medical examination without abnormal findings: Secondary | ICD-10-CM | POA: Diagnosis not present

## 2016-04-15 DIAGNOSIS — E119 Type 2 diabetes mellitus without complications: Secondary | ICD-10-CM | POA: Diagnosis not present

## 2016-04-15 DIAGNOSIS — M810 Age-related osteoporosis without current pathological fracture: Secondary | ICD-10-CM | POA: Diagnosis not present

## 2016-04-15 DIAGNOSIS — I10 Essential (primary) hypertension: Secondary | ICD-10-CM | POA: Diagnosis not present

## 2016-04-30 DIAGNOSIS — R278 Other lack of coordination: Secondary | ICD-10-CM | POA: Diagnosis not present

## 2016-04-30 DIAGNOSIS — G9340 Encephalopathy, unspecified: Secondary | ICD-10-CM | POA: Diagnosis not present

## 2016-04-30 DIAGNOSIS — M6281 Muscle weakness (generalized): Secondary | ICD-10-CM | POA: Diagnosis not present

## 2016-05-11 ENCOUNTER — Ambulatory Visit (INDEPENDENT_AMBULATORY_CARE_PROVIDER_SITE_OTHER): Payer: Medicare Other | Admitting: Urology

## 2016-05-11 DIAGNOSIS — N200 Calculus of kidney: Secondary | ICD-10-CM

## 2016-05-11 DIAGNOSIS — N312 Flaccid neuropathic bladder, not elsewhere classified: Secondary | ICD-10-CM

## 2016-05-17 ENCOUNTER — Emergency Department (HOSPITAL_COMMUNITY): Payer: Medicare Other

## 2016-05-17 ENCOUNTER — Encounter (HOSPITAL_COMMUNITY): Payer: Self-pay

## 2016-05-17 ENCOUNTER — Emergency Department (HOSPITAL_COMMUNITY)
Admission: EM | Admit: 2016-05-17 | Discharge: 2016-05-17 | Disposition: A | Discharge status: Home / Self Care | Payer: Medicare Other | Attending: Emergency Medicine | Admitting: Emergency Medicine

## 2016-05-17 DIAGNOSIS — Z794 Long term (current) use of insulin: Secondary | ICD-10-CM | POA: Insufficient documentation

## 2016-05-17 DIAGNOSIS — I10 Essential (primary) hypertension: Secondary | ICD-10-CM | POA: Insufficient documentation

## 2016-05-17 DIAGNOSIS — R279 Unspecified lack of coordination: Secondary | ICD-10-CM | POA: Diagnosis not present

## 2016-05-17 DIAGNOSIS — Z79899 Other long term (current) drug therapy: Secondary | ICD-10-CM | POA: Insufficient documentation

## 2016-05-17 DIAGNOSIS — E11649 Type 2 diabetes mellitus with hypoglycemia without coma: Secondary | ICD-10-CM | POA: Insufficient documentation

## 2016-05-17 DIAGNOSIS — E161 Other hypoglycemia: Secondary | ICD-10-CM | POA: Diagnosis not present

## 2016-05-17 DIAGNOSIS — Z743 Need for continuous supervision: Secondary | ICD-10-CM | POA: Diagnosis not present

## 2016-05-17 DIAGNOSIS — E162 Hypoglycemia, unspecified: Secondary | ICD-10-CM | POA: Diagnosis not present

## 2016-05-17 DIAGNOSIS — R4182 Altered mental status, unspecified: Secondary | ICD-10-CM | POA: Diagnosis present

## 2016-05-17 DIAGNOSIS — R55 Syncope and collapse: Secondary | ICD-10-CM | POA: Diagnosis not present

## 2016-05-17 HISTORY — DX: Unspecified osteoarthritis, unspecified site: M19.90

## 2016-05-17 HISTORY — DX: Unspecified abnormalities of gait and mobility: R26.9

## 2016-05-17 LAB — COMPREHENSIVE METABOLIC PANEL
ALBUMIN: 3.2 g/dL — AB (ref 3.5–5.0)
ALK PHOS: 56 U/L (ref 38–126)
ALT: 6 U/L — AB (ref 17–63)
ANION GAP: 6 (ref 5–15)
AST: 15 U/L (ref 15–41)
BUN: 16 mg/dL (ref 6–20)
CALCIUM: 8.7 mg/dL — AB (ref 8.9–10.3)
CO2: 25 mmol/L (ref 22–32)
CREATININE: 1.01 mg/dL (ref 0.61–1.24)
Chloride: 105 mmol/L (ref 101–111)
GFR calc Af Amer: >60 mL/min (ref >60)
GFR calc non Af Amer: >60 mL/min (ref >60)
GLUCOSE: 168 mg/dL — AB (ref 65–99)
Potassium: 4 mmol/L (ref 3.5–5.1)
Sodium: 136 mmol/L (ref 135–145)
Total Bilirubin: 0.5 mg/dL (ref 0.3–1.2)
Total Protein: 6.2 g/dL — ABNORMAL LOW (ref 6.5–8.1)

## 2016-05-17 LAB — CBG MONITORING, ED: Glucose-Capillary: 160 mg/dL — ABNORMAL HIGH (ref 65–99)

## 2016-05-17 LAB — CBC WITH DIFFERENTIAL/PLATELET
BASOS PCT: 0 %
Basophils Absolute: 0 10*3/uL (ref 0.0–0.1)
Eosinophils Absolute: 0 10*3/uL (ref 0.0–0.7)
Eosinophils Relative: 0 %
HEMATOCRIT: 40.6 % (ref 39.0–52.0)
Hemoglobin: 12.8 g/dL — ABNORMAL LOW (ref 13.0–17.0)
Lymphocytes Relative: 6 %
Lymphs Abs: 0.6 10*3/uL — ABNORMAL LOW (ref 0.7–4.0)
MCH: 30.6 pg (ref 26.0–34.0)
MCHC: 31.5 g/dL (ref 30.0–36.0)
MCV: 97.1 fL (ref 78.0–100.0)
MONO ABS: 0.6 10*3/uL (ref 0.1–1.0)
MONOS PCT: 7 %
NEUTROS ABS: 8.3 10*3/uL — AB (ref 1.7–7.7)
Neutrophils Relative %: 86 %
Platelets: 217 10*3/uL (ref 150–400)
RBC: 4.18 MIL/uL — ABNORMAL LOW (ref 4.22–5.81)
RDW: 13.1 % (ref 11.5–15.5)
WBC: 9.6 10*3/uL (ref 4.0–10.5)

## 2016-05-17 LAB — LIPASE, BLOOD: Lipase: 20 U/L (ref 11–51)

## 2016-05-17 MED ORDER — SODIUM CHLORIDE 0.9 % IV BOLUS (SEPSIS)
500.0000 mL | Freq: Once | INTRAVENOUS | Status: AC
  Administered 2016-05-17: 500 mL via INTRAVENOUS

## 2016-05-17 MED ORDER — SODIUM CHLORIDE 0.9 % IV SOLN: INTRAVENOUS | Status: DC

## 2016-05-17 NOTE — ED Notes (Signed)
Discharge instructions reviewed with EMS staff to inform [facility] staff about patient needing to eat on a regular schedule and check blood sugar levels as scheduled. Patient had crackers and sprite prior to d/c. Alert and oriented x4 at d/c.

## 2016-05-17 NOTE — ED Notes (Addendum)
Attempted to obtain cath urine sample. Writer was unable to see urthera. Unable to insert foley in penis.  Tori, CNA present at bedside during procedure. After attempt, assisted patient to stand at bedside to obtain urine in urinal. Patient was unable to provide urine sample. Dr. Deretha EmoryZackowski made aware.

## 2016-05-17 NOTE — ED Notes (Addendum)
Attempted to call report to the nurse at [facility] and placed on hold. No one ever came back to answer phone.

## 2016-05-17 NOTE — ED Provider Notes (Signed)
[facility]-EMERGENCY DEPT Provider Note   CSN: [id number] Arrival date & time: 05/17/16  1444     History   Chief Complaint Chief Complaint  Patient presents with  . Hypoglycemia    HPI Otha R Rada HayMurrell is a 60 y.o. male.  Patient brought in by EMS from nursing facility for high O glycemia. Blood sugar was 29 upon their arrival they gave an amp of D50. Blood sugar went up into the 200s. Patient started to wake up. But upon arrival here family member says still not back to baseline. Patient has not walked now for months but normally is quite alert and talkative. Patient still sleepy here not able to follow commands. Eyes open spontaneously. Eyes do track. Some movement of upper extremities.      Past Medical History:  Diagnosis Date  . Arthritis   . Diabetes mellitus   . DKA (diabetic ketoacidoses) (HCC)   . Gait abnormality   . Gram-positive bacteremia   . Hypernatremia   . Hypertension   . Hypokalemia   . MR (mental retardation)   . Renal disorder   . Sepsis(995.91)   . UTI (urinary tract infection)     Patient Active Problem List   Diagnosis Date Noted  . Sepsis (HCC) 11/02/2015  . UTI (urinary tract infection) 11/02/2015  . Sinus tachycardia 11/02/2015  . Dehydration 11/02/2015  . Leukocytosis 11/02/2015  . DM type 2 causing eye disease, not at goal [facility](HCC) 11/02/2015  . Hypokalemia 03/04/2014  . Bacteremia due to Gram-positive bacteria 06/03/2011  . Hypernatremia 06/03/2011  . Hyperkalemia 05/29/2011  . DKA (diabetic ketoacidoses) (HCC) 05/29/2011  . Renal failure 05/29/2011  . UTI (lower urinary tract infection) 05/29/2011    History reviewed. No pertinent surgical history.     Home Medications    Prior to Admission medications   Medication Sig Start Date End Date Taking? Authorizing Provider  acetaminophen (TYLENOL) 500 MG tablet Take 500 mg by mouth every 6 (six) hours as needed.   Yes Historical Provider, MD  Alum & Mag Hydroxide-Simeth (ANTACID &  ANTIGAS) 200-200-20 MG/5ML SUSP Take 30 mLs by mouth daily as needed (heartburn).   Yes Historical Provider, MD  benzonatate (TESSALON) 100 MG capsule Take 100 mg by mouth every 8 (eight) hours as needed for cough.   Yes Historical Provider, MD  insulin glargine (LANTUS) 100 UNIT/ML injection Inject 0.1 mLs (10 Units total) into the skin at bedtime. Patient taking differently: Inject 15 Units into the skin at bedtime.  11/11/15  Yes Standley Brookinganiel P Goodrich, MD  insulin lispro (HUMALOG) 100 UNIT/ML injection Inject 2-12 Units into the skin 3 (three) times daily. Sliding scale. 150-200=2 units, 201-250-4 units, 251-300=6 units, 301-350=8 units, 351-400=10 units, 401-450=12 units   Yes Historical Provider, MD  labetalol (NORMODYNE) 300 MG tablet Take 300 mg by mouth 2 (two) times daily.  10/30/15  Yes Historical Provider, MD  levothyroxine (SYNTHROID, LEVOTHROID) 50 MCG tablet Take 50 mcg by mouth daily before breakfast.   Yes Historical Provider, MD  magnesium hydroxide (MILK OF MAGNESIA) 400 MG/5ML suspension Take 30 mLs by mouth daily as needed for mild constipation.   Yes Historical Provider, MD  Magnesium Sulfate, Laxative, (EPSOM SALT) GRAN by Does not apply route. Soak both feet in lukewarm epsom salt water 2-3 times per week for 10-15 minutes.   Yes Historical Provider, MD  Potassium Chloride 40 MEQ/15ML (20%) SOLN Take 7.5 mLs by mouth daily.   Yes Historical Provider, MD  tamsulosin (FLOMAX) 0.4 MG CAPS capsule  Take 0.4 mg by mouth every other day.  10/30/15  Yes Historical Provider, MD  tolterodine (DETROL) 1 MG tablet Take 1 mg by mouth daily as needed (bladder).   Yes Historical Provider, MD    Family History No family history on file.  Social History Social History  Substance Use Topics  . Smoking status: Never Smoker  . Smokeless tobacco: Never Used  . Alcohol use No     Allergies   Patient has no known allergies.   Review of Systems Review of Systems  Unable to perform ROS: Mental  status change     Physical Exam Updated Vital Signs BP 116/70   Pulse 102   Temp 98 F (36.7 C) (Oral)   Resp 18   SpO2 96%   Physical Exam  Constitutional: He appears well-developed and well-nourished.  HENT:  Head: Normocephalic and atraumatic.  Mouth/Throat: Oropharynx is clear and moist.  Eyes: EOM are normal. Pupils are equal, round, and reactive to light.  Neck: Normal range of motion. Neck supple.  Cardiovascular: Normal rate, regular rhythm and normal heart sounds.   Pulmonary/Chest: Effort normal and breath sounds normal.  Abdominal: Soft. Bowel sounds are normal. There is no tenderness.  Neurological: He is alert. No cranial nerve deficit.  Bilateral lower trimming the weakness.     ED Treatments / Results  Labs (all labs ordered are listed, but only abnormal results are displayed) Labs Reviewed  COMPREHENSIVE METABOLIC PANEL - Abnormal; Notable for the following:       Result Value   Glucose, Bld 168 (*)    Calcium 8.7 (*)    Total Protein 6.2 (*)    Albumin 3.2 (*)    ALT 6 (*)    All other components within normal limits  CBC WITH DIFFERENTIAL/PLATELET - Abnormal; Notable for the following:    RBC 4.18 (*)    Hemoglobin 12.8 (*)    Neutro Abs 8.3 (*)    Lymphs Abs 0.6 (*)    All other components within normal limits  CBG MONITORING, ED - Abnormal; Notable for the following:    Glucose-Capillary 160 (*)    All other components within normal limits  LIPASE, BLOOD  URINALYSIS, ROUTINE W REFLEX MICROSCOPIC  CBG MONITORING, ED   Results for orders placed or performed during the hospital encounter of 05/17/16  Comprehensive metabolic panel  Result Value Ref Range   Sodium 136 135 - 145 mmol/L   Potassium 4.0 3.5 - 5.1 mmol/L   Chloride 105 101 - 111 mmol/L   CO2 25 22 - 32 mmol/L   Glucose, Bld 168 (H) 65 - 99 mg/dL   BUN 16 6 - 20 mg/dL   Creatinine, Ser 3.241.01 0.61 - 1.24 mg/dL   Calcium 8.7 (L) 8.9 - 10.3 mg/dL   Total Protein 6.2 (L) 6.5 - 8.1  g/dL   Albumin 3.2 (L) 3.5 - 5.0 g/dL   AST 15 15 - 41 U/L   ALT 6 (L) 17 - 63 U/L   Alkaline Phosphatase 56 38 - 126 U/L   Total Bilirubin 0.5 0.3 - 1.2 mg/dL   GFR calc non Af Amer >60 >60 mL/min   GFR calc Af Amer >60 >60 mL/min   Anion gap 6 5 - 15  CBC with Differential/Platelet  Result Value Ref Range   WBC 9.6 4.0 - 10.5 K/uL   RBC 4.18 (L) 4.22 - 5.81 MIL/uL   Hemoglobin 12.8 (L) 13.0 - 17.0 g/dL   HCT 40.140.6 02.739.0 -  52.0 %   MCV 97.1 78.0 - 100.0 fL   MCH 30.6 26.0 - 34.0 pg   MCHC 31.5 30.0 - 36.0 g/dL   RDW 16.1 09.6 - 04.5 %   Platelets 217 150 - 400 K/uL   Neutrophils Relative % 86 %   Neutro Abs 8.3 (H) 1.7 - 7.7 K/uL   Lymphocytes Relative 6 %   Lymphs Abs 0.6 (L) 0.7 - 4.0 K/uL   Monocytes Relative 7 %   Monocytes Absolute 0.6 0.1 - 1.0 K/uL   Eosinophils Relative 0 %   Eosinophils Absolute 0.0 0.0 - 0.7 K/uL   Basophils Relative 0 %   Basophils Absolute 0.0 0.0 - 0.1 K/uL  Lipase, blood  Result Value Ref Range   Lipase 20 11 - 51 U/L  POC CBG, ED  Result Value Ref Range   Glucose-Capillary 160 (H) 65 - 99 mg/dL     EKG  EKG Interpretation None      ED ECG REPORT   Date: 05/17/2016  Rate: 82  Rhythm: normal sinus rhythm  QRS Axis: normal  Intervals: QT prolonged  ST/T Wave abnormalities: nonspecific ST/T changes  Conduction Disutrbances:none  Narrative Interpretation:   Old EKG Reviewed: none available  I have personally reviewed the EKG tracing and agree with the computerized printout as noted.  Radiology Dg Chest 2 View  Result Date: 05/17/2016 CLINICAL DATA:  Unresponsive today.  Alert during x-ray. EXAM: CHEST  2 VIEW COMPARISON:  11/02/2015 FINDINGS: The heart size and mediastinal contours are within normal limits. Both lungs are clear. The visualized skeletal structures are unremarkable. IMPRESSION: No active cardiopulmonary disease. Electronically Signed   By: Elige Ko   On: 05/17/2016 16:23   Ct Head Wo Contrast  Result Date:  05/17/2016 CLINICAL DATA:  Altered mental status.  Hypoglycemia. EXAM: CT HEAD WITHOUT CONTRAST TECHNIQUE: Contiguous axial images were obtained from the base of the skull through the vertex without intravenous contrast. COMPARISON:  02/17/2016 head CT. FINDINGS: Motion degraded scan. Brain: No evidence of parenchymal hemorrhage or extra-axial fluid collection. No mass lesion, mass effect, or midline shift. No CT evidence of acute infarction. Generalized cerebral volume loss. Intracranial atherosclerosis. Nonspecific moderate subcortical and periventricular white matter hypodensity, most in keeping with chronic small vessel ischemic change. Cerebral ventricle sizes are stable and concordant with the degree of cerebral volume loss. Vascular: No hyperdense vessel or unexpected calcification. Skull: No evidence of calvarial fracture. Sinuses/Orbits: The visualized paranasal sinuses are essentially clear. Other:  Stable chronic small bilateral mastoid effusions inferiorly. IMPRESSION: 1.  No evidence of acute intracranial abnormality. 2. Generalized cerebral volume loss and moderate chronic small vessel ischemia. 3. Chronic small bilateral mastoid effusions. Electronically Signed   By: Delbert Phenix M.D.   On: 05/17/2016 17:05    Procedures Procedures (including critical care time)  Medications Ordered in ED Medications  0.9 %  sodium chloride infusion (not administered)  sodium chloride 0.9 % bolus 500 mL (0 mLs Intravenous Stopped 05/17/16 1906)     Initial Impression / Assessment and Plan / ED Course  I have reviewed the triage vital signs and the nursing notes.  Pertinent labs & imaging results that were available during my care of the patient were reviewed by me and considered in my medical decision making (see chart for details).  Clinical Course    Patient brought in from nursing facility. With market hypoglycemia and altered mental status chest changes. Blood sugar was down to 29. EMS gave  him 1  amp of D50. Blood sugars came up into the 200s. Since that time patient is maintain his blood sugars fine. Patient did not have lunch. Not clear how much food he had breakfast. Family member was told that his blood sugars were high at breakfast. Workup without any significant findings. Patient's mental status is now back to baseline. Unable to obtain a urine from him due to urine incontinence problems and difficulty with in and out cath. So not able to confirm whether urinary tract infection or not. But no fevers. No leukocytosis. Patient certainly not clinically toxic from a urinary tract infection at this time. Patient stable for discharge back to facility.    Final Clinical Impressions(s) / ED Diagnoses   Final diagnoses:  Hypoglycemia    New Prescriptions New Prescriptions   No medications on file     Vanetta Mulders, MD 05/17/16 2059

## 2016-05-17 NOTE — ED Notes (Addendum)
Called [facility] back to attempt to give report, no one pick up phone.

## 2016-05-17 NOTE — ED Notes (Addendum)
[facility] x[facility]. Spoke to patients sister and per sister she spoke to staff earlier and told them patient was coming back to facility.

## 2016-05-17 NOTE — ED Triage Notes (Signed)
Per ems, pt was unresponsive and cbg was 29.  EMS administered 1 amp d50 and pt returned to baseline.  Pt presently alert at this time.  PT did not eat any lunch but staff told ems that was normal for him.  CBG was 228 after the amp of d50.

## 2016-05-17 NOTE — Discharge Instructions (Signed)
Return for any new or worse symptoms. Blood sugar here is been maintained fine. Workup for the acute mental status changes without any significant findings other than the finding of low blood sugar. Low blood sugar has resolved. Urinalysis not obtainable. So urinary tract infection has not been completely ruled out. Return for any new or worse symptoms.

## 2016-05-17 NOTE — ED Notes (Signed)
To Ct 

## 2016-05-18 LAB — CBG MONITORING, ED: Glucose-Capillary: 114 mg/dL — ABNORMAL HIGH (ref 65–99)

## 2016-05-20 DIAGNOSIS — Z593 Problems related to living in residential institution: Secondary | ICD-10-CM | POA: Diagnosis not present

## 2016-05-20 DIAGNOSIS — Z7689 Persons encountering health services in other specified circumstances: Secondary | ICD-10-CM | POA: Diagnosis not present

## 2016-05-21 DIAGNOSIS — L603 Nail dystrophy: Secondary | ICD-10-CM | POA: Diagnosis not present

## 2016-05-21 DIAGNOSIS — E1142 Type 2 diabetes mellitus with diabetic polyneuropathy: Secondary | ICD-10-CM | POA: Diagnosis not present

## 2016-05-26 DIAGNOSIS — Z743 Need for continuous supervision: Secondary | ICD-10-CM | POA: Diagnosis not present

## 2016-05-26 DIAGNOSIS — G9340 Encephalopathy, unspecified: Secondary | ICD-10-CM | POA: Diagnosis not present

## 2016-05-26 DIAGNOSIS — A419 Sepsis, unspecified organism: Secondary | ICD-10-CM | POA: Diagnosis not present

## 2016-05-26 DIAGNOSIS — R1010 Upper abdominal pain, unspecified: Secondary | ICD-10-CM | POA: Diagnosis not present

## 2016-05-26 DIAGNOSIS — N390 Urinary tract infection, site not specified: Secondary | ICD-10-CM | POA: Diagnosis not present

## 2016-05-26 DIAGNOSIS — R1084 Generalized abdominal pain: Secondary | ICD-10-CM | POA: Diagnosis not present

## 2016-05-26 DIAGNOSIS — N320 Bladder-neck obstruction: Secondary | ICD-10-CM | POA: Diagnosis not present

## 2016-05-26 DIAGNOSIS — N138 Other obstructive and reflux uropathy: Secondary | ICD-10-CM | POA: Diagnosis not present

## 2016-05-26 DIAGNOSIS — K5909 Other constipation: Secondary | ICD-10-CM | POA: Diagnosis not present

## 2016-05-26 DIAGNOSIS — R1030 Lower abdominal pain, unspecified: Secondary | ICD-10-CM | POA: Diagnosis not present

## 2016-05-26 DIAGNOSIS — N401 Enlarged prostate with lower urinary tract symptoms: Secondary | ICD-10-CM | POA: Diagnosis not present

## 2016-05-26 DIAGNOSIS — R2681 Unsteadiness on feet: Secondary | ICD-10-CM | POA: Diagnosis not present

## 2016-05-26 DIAGNOSIS — N200 Calculus of kidney: Secondary | ICD-10-CM | POA: Diagnosis not present

## 2016-05-26 DIAGNOSIS — I10 Essential (primary) hypertension: Secondary | ICD-10-CM | POA: Diagnosis not present

## 2016-05-26 DIAGNOSIS — M4000 Postural kyphosis, site unspecified: Secondary | ICD-10-CM | POA: Diagnosis not present

## 2016-05-26 DIAGNOSIS — R279 Unspecified lack of coordination: Secondary | ICD-10-CM | POA: Diagnosis not present

## 2016-05-26 DIAGNOSIS — R338 Other retention of urine: Secondary | ICD-10-CM | POA: Diagnosis not present

## 2016-05-26 DIAGNOSIS — E039 Hypothyroidism, unspecified: Secondary | ICD-10-CM | POA: Diagnosis not present

## 2016-05-26 DIAGNOSIS — R0789 Other chest pain: Secondary | ICD-10-CM | POA: Diagnosis not present

## 2016-05-26 DIAGNOSIS — F72 Severe intellectual disabilities: Secondary | ICD-10-CM | POA: Diagnosis not present

## 2016-05-26 DIAGNOSIS — R739 Hyperglycemia, unspecified: Secondary | ICD-10-CM | POA: Diagnosis not present

## 2016-05-26 DIAGNOSIS — R7989 Other specified abnormal findings of blood chemistry: Secondary | ICD-10-CM | POA: Diagnosis not present

## 2016-05-26 DIAGNOSIS — R41841 Cognitive communication deficit: Secondary | ICD-10-CM | POA: Diagnosis not present

## 2016-05-26 DIAGNOSIS — Z9889 Other specified postprocedural states: Secondary | ICD-10-CM | POA: Diagnosis not present

## 2016-05-26 DIAGNOSIS — N3289 Other specified disorders of bladder: Secondary | ICD-10-CM | POA: Diagnosis not present

## 2016-05-26 DIAGNOSIS — R1312 Dysphagia, oropharyngeal phase: Secondary | ICD-10-CM | POA: Diagnosis not present

## 2016-05-26 DIAGNOSIS — A4189 Other specified sepsis: Secondary | ICD-10-CM | POA: Diagnosis not present

## 2016-05-26 DIAGNOSIS — E119 Type 2 diabetes mellitus without complications: Secondary | ICD-10-CM | POA: Diagnosis not present

## 2016-05-26 DIAGNOSIS — R748 Abnormal levels of other serum enzymes: Secondary | ICD-10-CM | POA: Diagnosis not present

## 2016-05-26 DIAGNOSIS — M6281 Muscle weakness (generalized): Secondary | ICD-10-CM | POA: Diagnosis not present

## 2016-05-26 DIAGNOSIS — Z794 Long term (current) use of insulin: Secondary | ICD-10-CM | POA: Diagnosis not present

## 2016-05-26 DIAGNOSIS — I509 Heart failure, unspecified: Secondary | ICD-10-CM | POA: Diagnosis not present

## 2016-05-26 DIAGNOSIS — R339 Retention of urine, unspecified: Secondary | ICD-10-CM | POA: Diagnosis not present

## 2016-05-26 DIAGNOSIS — R278 Other lack of coordination: Secondary | ICD-10-CM | POA: Diagnosis not present

## 2016-06-01 ENCOUNTER — Ambulatory Visit: Payer: Medicare Other | Admitting: Urology

## 2016-06-02 DIAGNOSIS — M4000 Postural kyphosis, site unspecified: Secondary | ICD-10-CM | POA: Diagnosis not present

## 2016-06-02 DIAGNOSIS — I509 Heart failure, unspecified: Secondary | ICD-10-CM | POA: Diagnosis not present

## 2016-06-02 DIAGNOSIS — Z743 Need for continuous supervision: Secondary | ICD-10-CM | POA: Diagnosis not present

## 2016-06-02 DIAGNOSIS — R319 Hematuria, unspecified: Secondary | ICD-10-CM | POA: Diagnosis not present

## 2016-06-02 DIAGNOSIS — R278 Other lack of coordination: Secondary | ICD-10-CM | POA: Diagnosis not present

## 2016-06-02 DIAGNOSIS — R05 Cough: Secondary | ICD-10-CM | POA: Diagnosis not present

## 2016-06-02 DIAGNOSIS — R69 Illness, unspecified: Secondary | ICD-10-CM | POA: Diagnosis not present

## 2016-06-02 DIAGNOSIS — N281 Cyst of kidney, acquired: Secondary | ICD-10-CM | POA: Diagnosis not present

## 2016-06-02 DIAGNOSIS — R41841 Cognitive communication deficit: Secondary | ICD-10-CM | POA: Diagnosis not present

## 2016-06-02 DIAGNOSIS — N401 Enlarged prostate with lower urinary tract symptoms: Secondary | ICD-10-CM | POA: Diagnosis not present

## 2016-06-02 DIAGNOSIS — E119 Type 2 diabetes mellitus without complications: Secondary | ICD-10-CM | POA: Diagnosis not present

## 2016-06-02 DIAGNOSIS — Z79899 Other long term (current) drug therapy: Secondary | ICD-10-CM | POA: Diagnosis not present

## 2016-06-02 DIAGNOSIS — R739 Hyperglycemia, unspecified: Secondary | ICD-10-CM | POA: Diagnosis not present

## 2016-06-02 DIAGNOSIS — N323 Diverticulum of bladder: Secondary | ICD-10-CM | POA: Diagnosis not present

## 2016-06-02 DIAGNOSIS — E088 Diabetes mellitus due to underlying condition with unspecified complications: Secondary | ICD-10-CM | POA: Diagnosis not present

## 2016-06-02 DIAGNOSIS — R338 Other retention of urine: Secondary | ICD-10-CM | POA: Diagnosis not present

## 2016-06-02 DIAGNOSIS — N390 Urinary tract infection, site not specified: Secondary | ICD-10-CM | POA: Diagnosis not present

## 2016-06-02 DIAGNOSIS — A4189 Other specified sepsis: Secondary | ICD-10-CM | POA: Diagnosis not present

## 2016-06-02 DIAGNOSIS — D72828 Other elevated white blood cell count: Secondary | ICD-10-CM | POA: Diagnosis not present

## 2016-06-02 DIAGNOSIS — E559 Vitamin D deficiency, unspecified: Secondary | ICD-10-CM | POA: Diagnosis not present

## 2016-06-02 DIAGNOSIS — F72 Severe intellectual disabilities: Secondary | ICD-10-CM | POA: Diagnosis not present

## 2016-06-02 DIAGNOSIS — N320 Bladder-neck obstruction: Secondary | ICD-10-CM | POA: Diagnosis not present

## 2016-06-02 DIAGNOSIS — R6889 Other general symptoms and signs: Secondary | ICD-10-CM | POA: Diagnosis not present

## 2016-06-02 DIAGNOSIS — E860 Dehydration: Secondary | ICD-10-CM | POA: Diagnosis not present

## 2016-06-02 DIAGNOSIS — I5089 Other heart failure: Secondary | ICD-10-CM | POA: Diagnosis not present

## 2016-06-02 DIAGNOSIS — N312 Flaccid neuropathic bladder, not elsewhere classified: Secondary | ICD-10-CM | POA: Diagnosis not present

## 2016-06-02 DIAGNOSIS — R339 Retention of urine, unspecified: Secondary | ICD-10-CM | POA: Diagnosis not present

## 2016-06-02 DIAGNOSIS — E039 Hypothyroidism, unspecified: Secondary | ICD-10-CM | POA: Diagnosis not present

## 2016-06-02 DIAGNOSIS — R748 Abnormal levels of other serum enzymes: Secondary | ICD-10-CM | POA: Diagnosis not present

## 2016-06-02 DIAGNOSIS — R1010 Upper abdominal pain, unspecified: Secondary | ICD-10-CM | POA: Diagnosis not present

## 2016-06-02 DIAGNOSIS — D649 Anemia, unspecified: Secondary | ICD-10-CM | POA: Diagnosis not present

## 2016-06-02 DIAGNOSIS — M6281 Muscle weakness (generalized): Secondary | ICD-10-CM | POA: Diagnosis not present

## 2016-06-02 DIAGNOSIS — A419 Sepsis, unspecified organism: Secondary | ICD-10-CM | POA: Diagnosis not present

## 2016-06-02 DIAGNOSIS — N3289 Other specified disorders of bladder: Secondary | ICD-10-CM | POA: Diagnosis not present

## 2016-06-02 DIAGNOSIS — R531 Weakness: Secondary | ICD-10-CM | POA: Diagnosis not present

## 2016-06-02 DIAGNOSIS — R2681 Unsteadiness on feet: Secondary | ICD-10-CM | POA: Diagnosis not present

## 2016-06-02 DIAGNOSIS — R279 Unspecified lack of coordination: Secondary | ICD-10-CM | POA: Diagnosis not present

## 2016-06-02 DIAGNOSIS — R1312 Dysphagia, oropharyngeal phase: Secondary | ICD-10-CM | POA: Diagnosis not present

## 2016-06-02 DIAGNOSIS — N200 Calculus of kidney: Secondary | ICD-10-CM | POA: Diagnosis not present

## 2016-06-02 DIAGNOSIS — G9340 Encephalopathy, unspecified: Secondary | ICD-10-CM | POA: Diagnosis not present

## 2016-06-02 DIAGNOSIS — I10 Essential (primary) hypertension: Secondary | ICD-10-CM | POA: Diagnosis not present

## 2016-06-03 DIAGNOSIS — I5089 Other heart failure: Secondary | ICD-10-CM | POA: Diagnosis not present

## 2016-06-03 DIAGNOSIS — N401 Enlarged prostate with lower urinary tract symptoms: Secondary | ICD-10-CM | POA: Diagnosis not present

## 2016-06-03 DIAGNOSIS — E039 Hypothyroidism, unspecified: Secondary | ICD-10-CM | POA: Diagnosis not present

## 2016-06-07 DIAGNOSIS — E039 Hypothyroidism, unspecified: Secondary | ICD-10-CM | POA: Diagnosis not present

## 2016-06-07 DIAGNOSIS — I5089 Other heart failure: Secondary | ICD-10-CM | POA: Diagnosis not present

## 2016-06-15 ENCOUNTER — Ambulatory Visit (INDEPENDENT_AMBULATORY_CARE_PROVIDER_SITE_OTHER): Payer: Medicare Other | Admitting: Urology

## 2016-06-15 DIAGNOSIS — N323 Diverticulum of bladder: Secondary | ICD-10-CM | POA: Diagnosis not present

## 2016-06-15 DIAGNOSIS — N312 Flaccid neuropathic bladder, not elsewhere classified: Secondary | ICD-10-CM

## 2016-07-06 ENCOUNTER — Ambulatory Visit (INDEPENDENT_AMBULATORY_CARE_PROVIDER_SITE_OTHER): Payer: Medicare Other | Admitting: Urology

## 2016-07-06 DIAGNOSIS — N320 Bladder-neck obstruction: Secondary | ICD-10-CM

## 2016-07-06 DIAGNOSIS — N312 Flaccid neuropathic bladder, not elsewhere classified: Secondary | ICD-10-CM | POA: Diagnosis not present

## 2016-07-06 DIAGNOSIS — N323 Diverticulum of bladder: Secondary | ICD-10-CM | POA: Diagnosis not present

## 2016-07-06 DIAGNOSIS — N200 Calculus of kidney: Secondary | ICD-10-CM | POA: Diagnosis not present

## 2016-07-07 ENCOUNTER — Other Ambulatory Visit: Payer: Self-pay | Admitting: Urology

## 2016-07-07 DIAGNOSIS — N200 Calculus of kidney: Secondary | ICD-10-CM

## 2016-07-08 ENCOUNTER — Other Ambulatory Visit: Payer: Self-pay | Admitting: Urology

## 2016-07-14 ENCOUNTER — Ambulatory Visit (HOSPITAL_COMMUNITY)
Admission: RE | Admit: 2016-07-14 | Discharge: 2016-07-14 | Disposition: A | Discharge status: Home / Self Care | Payer: No Typology Code available for payment source | Source: Ambulatory Visit | Attending: Urology | Admitting: Urology

## 2016-07-14 DIAGNOSIS — N281 Cyst of kidney, acquired: Secondary | ICD-10-CM | POA: Insufficient documentation

## 2016-07-14 DIAGNOSIS — N200 Calculus of kidney: Secondary | ICD-10-CM | POA: Diagnosis not present

## 2016-07-15 DIAGNOSIS — N401 Enlarged prostate with lower urinary tract symptoms: Secondary | ICD-10-CM | POA: Diagnosis not present

## 2016-07-15 DIAGNOSIS — R05 Cough: Secondary | ICD-10-CM | POA: Diagnosis not present

## 2016-07-15 DIAGNOSIS — N3289 Other specified disorders of bladder: Secondary | ICD-10-CM | POA: Diagnosis not present

## 2016-07-19 DIAGNOSIS — R531 Weakness: Secondary | ICD-10-CM | POA: Diagnosis not present

## 2016-07-19 DIAGNOSIS — I5089 Other heart failure: Secondary | ICD-10-CM | POA: Diagnosis not present

## 2016-07-19 DIAGNOSIS — R05 Cough: Secondary | ICD-10-CM | POA: Diagnosis not present

## 2016-07-19 DIAGNOSIS — N3289 Other specified disorders of bladder: Secondary | ICD-10-CM | POA: Diagnosis not present

## 2016-07-21 DIAGNOSIS — D72828 Other elevated white blood cell count: Secondary | ICD-10-CM | POA: Diagnosis not present

## 2016-07-21 DIAGNOSIS — A419 Sepsis, unspecified organism: Secondary | ICD-10-CM | POA: Diagnosis not present

## 2016-07-21 DIAGNOSIS — E860 Dehydration: Secondary | ICD-10-CM | POA: Diagnosis not present

## 2016-07-21 DIAGNOSIS — N3289 Other specified disorders of bladder: Secondary | ICD-10-CM | POA: Diagnosis not present

## 2016-07-21 DIAGNOSIS — R69 Illness, unspecified: Secondary | ICD-10-CM | POA: Diagnosis not present

## 2016-07-25 DIAGNOSIS — N3289 Other specified disorders of bladder: Secondary | ICD-10-CM | POA: Diagnosis not present

## 2016-07-25 DIAGNOSIS — A419 Sepsis, unspecified organism: Secondary | ICD-10-CM | POA: Diagnosis not present

## 2016-07-25 DIAGNOSIS — E860 Dehydration: Secondary | ICD-10-CM | POA: Diagnosis not present

## 2016-07-25 DIAGNOSIS — D72828 Other elevated white blood cell count: Secondary | ICD-10-CM | POA: Diagnosis not present

## 2016-07-29 ENCOUNTER — Other Ambulatory Visit (HOSPITAL_COMMUNITY): Payer: Self-pay | Admitting: *Deleted

## 2016-07-29 ENCOUNTER — Encounter (HOSPITAL_COMMUNITY): Payer: Self-pay | Admitting: *Deleted

## 2016-07-29 DIAGNOSIS — A419 Sepsis, unspecified organism: Secondary | ICD-10-CM | POA: Diagnosis not present

## 2016-07-29 DIAGNOSIS — D72828 Other elevated white blood cell count: Secondary | ICD-10-CM | POA: Diagnosis not present

## 2016-07-29 DIAGNOSIS — E860 Dehydration: Secondary | ICD-10-CM | POA: Diagnosis not present

## 2016-07-29 DIAGNOSIS — N3289 Other specified disorders of bladder: Secondary | ICD-10-CM | POA: Diagnosis not present

## 2016-07-29 NOTE — Progress Notes (Signed)
Faxed all pre op instructions to [facility] planning/management officerrn director of nursing and spoke with her, all pre op instructons received and understood, left message for selita to get dr Retta Dionesdahlstedt to fax order to nursing home for hibiclens shower hs and am of surgery to fax [phone], instructed nursing home to wash patient with soap hs on 08-01-16 and am 08-02-16 if no order received for hibiclens showers. Spoke with brenda sneed sister health care poa and she will be coming in transportation [place] with patient to sign consent day of surgery.

## 2016-07-29 NOTE — Progress Notes (Addendum)
Preop instructions for: [name]                        Date of Birth  02/26/1956                          Date of Procedure:       Doctor: [facility]630 AM Report to: Admitting  Procedure:INSERTION OF SUPRAPUBIC CATHETER, CYSTOSCOPY WITH LITHOLAPAXY  Do not eat or drink past midnight the night before your procedure.(To include any tube feedings-must be discontinued)  take normal morning lantus insulin dose on 07-04-16, tale only 1/2 evening dose lantus (5 units ) on 08-01-16 Take these morning medications morning of procedure 5 units of lantus insulin, take no meds by mouth due to meds must be crushed and taken with food, PLEASE CHECK AM BLOOD SUGAR AND RECORD IT AND HE MAY TAKE 1/2 DOSE OF NOVOLOG SLIDING SCALE IF BLOOD SUGAR IS GREATER THAN 220 MORNING OF SURGERY.  FOLLOW [place] PREPARING FOR SURGERY INSTRUCTION SHEET ENCLOSED.   Facility contact:  DEGLORACIOUS GARNER                 Phone:  [phone]                Health Care XBJ:YNWGNFPOA:BRENDA Ollen BowlSNEED SISTER CELL [phone] COMING WITH PATIENT DAY OF PROCEDURE ON VAN  Transportation contact phone#:[phone]  Please send day of procedure:current med list and meds last taken that day, confirm nothing by mouth status from what time, Patient Demographic info( to include DNR status, problem list, allergies)   RN contact name/phone#:  SAME CONTACT AS ABOVE                           and Fax #[phone]  [employer] Insurance card and picture ID Leave all jewelry and other valuables at place where living( no metal or rings to be worn) No contact lens Women-no make-up, no lotions,perfumes,powders Men-no colognes,lotions  Any questions day of procedure,call SHORT STAY [phone]  Sent from :[facility]                   Phone:[phone]                   Fax:[phone]  Sent by :Cain SieveSHARON Derius Ghosh RN

## 2016-08-02 ENCOUNTER — Encounter (HOSPITAL_COMMUNITY): Payer: Self-pay | Admitting: *Deleted

## 2016-08-02 ENCOUNTER — Ambulatory Visit (HOSPITAL_COMMUNITY): Payer: Medicare Other | Admitting: Registered Nurse

## 2016-08-02 ENCOUNTER — Encounter (HOSPITAL_COMMUNITY)
Admission: RE | Disposition: A | Discharge status: Home / Self Care | Payer: Self-pay | Source: Ambulatory Visit | Attending: Urology

## 2016-08-02 ENCOUNTER — Observation Stay (HOSPITAL_COMMUNITY)
Admission: RE | Admit: 2016-08-02 | Discharge: 2016-08-03 | Disposition: A | Discharge status: Home / Self Care | Payer: Medicare Other | Source: Ambulatory Visit | Attending: Urology | Admitting: Urology

## 2016-08-02 DIAGNOSIS — N210 Calculus in bladder: Secondary | ICD-10-CM | POA: Diagnosis not present

## 2016-08-02 DIAGNOSIS — E039 Hypothyroidism, unspecified: Secondary | ICD-10-CM | POA: Diagnosis not present

## 2016-08-02 DIAGNOSIS — N319 Neuromuscular dysfunction of bladder, unspecified: Secondary | ICD-10-CM | POA: Diagnosis not present

## 2016-08-02 DIAGNOSIS — I110 Hypertensive heart disease with heart failure: Secondary | ICD-10-CM | POA: Diagnosis not present

## 2016-08-02 DIAGNOSIS — R531 Weakness: Secondary | ICD-10-CM | POA: Diagnosis not present

## 2016-08-02 DIAGNOSIS — I509 Heart failure, unspecified: Secondary | ICD-10-CM | POA: Insufficient documentation

## 2016-08-02 DIAGNOSIS — R3914 Feeling of incomplete bladder emptying: Secondary | ICD-10-CM | POA: Diagnosis not present

## 2016-08-02 DIAGNOSIS — F72 Severe intellectual disabilities: Secondary | ICD-10-CM | POA: Insufficient documentation

## 2016-08-02 DIAGNOSIS — R41841 Cognitive communication deficit: Secondary | ICD-10-CM | POA: Insufficient documentation

## 2016-08-02 DIAGNOSIS — N400 Enlarged prostate without lower urinary tract symptoms: Secondary | ICD-10-CM | POA: Insufficient documentation

## 2016-08-02 DIAGNOSIS — E119 Type 2 diabetes mellitus without complications: Secondary | ICD-10-CM | POA: Insufficient documentation

## 2016-08-02 DIAGNOSIS — Z794 Long term (current) use of insulin: Secondary | ICD-10-CM | POA: Insufficient documentation

## 2016-08-02 DIAGNOSIS — E860 Dehydration: Secondary | ICD-10-CM | POA: Diagnosis not present

## 2016-08-02 DIAGNOSIS — N390 Urinary tract infection, site not specified: Secondary | ICD-10-CM | POA: Diagnosis not present

## 2016-08-02 DIAGNOSIS — M1990 Unspecified osteoarthritis, unspecified site: Secondary | ICD-10-CM | POA: Insufficient documentation

## 2016-08-02 DIAGNOSIS — Z87440 Personal history of urinary (tract) infections: Secondary | ICD-10-CM | POA: Diagnosis not present

## 2016-08-02 HISTORY — DX: Adverse effect of unspecified anesthetic, initial encounter: T41.45XA

## 2016-08-02 HISTORY — DX: Chronic cough: R05.3

## 2016-08-02 HISTORY — DX: Family history of other specified conditions: Z84.89

## 2016-08-02 HISTORY — DX: Other complications of anesthesia, initial encounter: T88.59XA

## 2016-08-02 HISTORY — PX: INSERTION OF SUPRAPUBIC CATHETER: SHX5870

## 2016-08-02 HISTORY — DX: Other specified disorders of bladder: N32.89

## 2016-08-02 HISTORY — DX: Dysphagia, unspecified: R13.10

## 2016-08-02 HISTORY — DX: Retention of urine, unspecified: R33.9

## 2016-08-02 HISTORY — DX: Cough: R05

## 2016-08-02 HISTORY — DX: Hypothyroidism, unspecified: E03.9

## 2016-08-02 HISTORY — PX: CYSTOSCOPY: SHX5120

## 2016-08-02 HISTORY — DX: Muscle weakness (generalized): M62.81

## 2016-08-02 HISTORY — DX: Cognitive communication deficit: R41.841

## 2016-08-02 HISTORY — DX: Encephalopathy, unspecified: G93.40

## 2016-08-02 HISTORY — DX: Postural kyphosis, site unspecified: M40.00

## 2016-08-02 HISTORY — DX: Heart failure, unspecified: I50.9

## 2016-08-02 HISTORY — DX: Severe intellectual disabilities: F72

## 2016-08-02 HISTORY — DX: Benign prostatic hyperplasia without lower urinary tract symptoms: N40.0

## 2016-08-02 HISTORY — DX: Personal history of urinary (tract) infections: Z87.440

## 2016-08-02 LAB — GLUCOSE, CAPILLARY
GLUCOSE-CAPILLARY: 115 mg/dL — AB (ref 65–99)
GLUCOSE-CAPILLARY: 183 mg/dL — AB (ref 65–99)
Glucose-Capillary: 169 mg/dL — ABNORMAL HIGH (ref 65–99)
Glucose-Capillary: 129 mg/dL — ABNORMAL HIGH (ref 65–99)
Glucose-Capillary: 156 mg/dL — ABNORMAL HIGH (ref 65–99)

## 2016-08-02 SURGERY — INSERTION, SUPRAPUBIC CATHETER
Anesthesia: General

## 2016-08-02 MED ORDER — PROMETHAZINE HCL 25 MG/ML IJ SOLN: 6.2500 mg | INTRAMUSCULAR | Status: DC | PRN

## 2016-08-02 MED ORDER — PROPOFOL 10 MG/ML IV BOLUS
INTRAVENOUS | Status: AC
  Filled 2016-08-02: qty 20

## 2016-08-02 MED ORDER — EPHEDRINE SULFATE-NACL 50-0.9 MG/10ML-% IV SOSY
PREFILLED_SYRINGE | INTRAVENOUS | Status: DC | PRN
  Administered 2016-08-02 (×2): 5 mg via INTRAVENOUS

## 2016-08-02 MED ORDER — ACETAMINOPHEN 500 MG PO TABS: 500.0000 mg | ORAL_TABLET | Freq: Four times a day (QID) | ORAL | Status: DC | PRN

## 2016-08-02 MED ORDER — FENTANYL CITRATE (PF) 100 MCG/2ML IJ SOLN: 25.0000 ug | INTRAMUSCULAR | Status: DC | PRN

## 2016-08-02 MED ORDER — INSULIN ASPART 100 UNIT/ML ~~LOC~~ SOLN: 0.0000 [IU] | Freq: Every day | SUBCUTANEOUS | Status: DC

## 2016-08-02 MED ORDER — STERILE WATER FOR IRRIGATION IR SOLN
Status: DC | PRN
  Administered 2016-08-02: 10 mL

## 2016-08-02 MED ORDER — FENTANYL CITRATE (PF) 100 MCG/2ML IJ SOLN
INTRAMUSCULAR | Status: DC | PRN
  Administered 2016-08-02 (×2): 50 ug via INTRAVENOUS

## 2016-08-02 MED ORDER — PHENYLEPHRINE 40 MCG/ML (10ML) SYRINGE FOR IV PUSH (FOR BLOOD PRESSURE SUPPORT)
PREFILLED_SYRINGE | INTRAVENOUS | Status: DC | PRN
  Administered 2016-08-02 (×2): 80 ug via INTRAVENOUS

## 2016-08-02 MED ORDER — POTASSIUM CHLORIDE 20 MEQ/15ML (10%) PO SOLN
40.0000 meq | Freq: Every day | ORAL | Status: DC
  Administered 2016-08-02 – 2016-08-03 (×2): 40 meq via ORAL
  Filled 2016-08-02 (×2): qty 30

## 2016-08-02 MED ORDER — MIDAZOLAM HCL 2 MG/2ML IJ SOLN
INTRAMUSCULAR | Status: AC
  Filled 2016-08-02: qty 2

## 2016-08-02 MED ORDER — STERILE WATER FOR IRRIGATION IR SOLN
Status: DC | PRN
  Administered 2016-08-02: 3000 mL

## 2016-08-02 MED ORDER — LABETALOL HCL 300 MG PO TABS
300.0000 mg | ORAL_TABLET | Freq: Two times a day (BID) | ORAL | Status: DC
  Administered 2016-08-02 – 2016-08-03 (×2): 300 mg via ORAL
  Filled 2016-08-02 (×3): qty 1

## 2016-08-02 MED ORDER — LEVOTHYROXINE SODIUM 50 MCG PO TABS
50.0000 ug | ORAL_TABLET | Freq: Every day | ORAL | Status: DC
  Administered 2016-08-03: 50 ug via ORAL
  Filled 2016-08-02: qty 1

## 2016-08-02 MED ORDER — PHENYLEPHRINE 40 MCG/ML (10ML) SYRINGE FOR IV PUSH (FOR BLOOD PRESSURE SUPPORT)
PREFILLED_SYRINGE | INTRAVENOUS | Status: AC
  Filled 2016-08-02: qty 10

## 2016-08-02 MED ORDER — CEFAZOLIN SODIUM-DEXTROSE 2-4 GM/100ML-% IV SOLN
2.0000 g | INTRAVENOUS | Status: AC
  Administered 2016-08-02: 2 g via INTRAVENOUS
  Filled 2016-08-02: qty 100

## 2016-08-02 MED ORDER — EPHEDRINE 5 MG/ML INJ
INTRAVENOUS | Status: AC
  Filled 2016-08-02: qty 10

## 2016-08-02 MED ORDER — OXYBUTYNIN CHLORIDE ER 5 MG PO TB24
10.0000 mg | ORAL_TABLET | Freq: Every day | ORAL | Status: DC
  Filled 2016-08-02: qty 2

## 2016-08-02 MED ORDER — INSULIN ASPART 100 UNIT/ML ~~LOC~~ SOLN
0.0000 [IU] | Freq: Three times a day (TID) | SUBCUTANEOUS | Status: DC
  Administered 2016-08-02: 2 [IU] via SUBCUTANEOUS
  Administered 2016-08-03: 1 [IU] via SUBCUTANEOUS

## 2016-08-02 MED ORDER — ONDANSETRON HCL 4 MG/2ML IJ SOLN
INTRAMUSCULAR | Status: DC | PRN
  Administered 2016-08-02: 4 mg via INTRAVENOUS

## 2016-08-02 MED ORDER — SODIUM CHLORIDE 0.9 % IR SOLN
Status: AC
  Filled 2016-08-02: qty 500000

## 2016-08-02 MED ORDER — ALUM & MAG HYDROXIDE-SIMETH 200-200-20 MG/5ML PO SUSP: 30.0000 mL | Freq: Every day | ORAL | Status: DC | PRN

## 2016-08-02 MED ORDER — SENNA 8.6 MG PO TABS
1.0000 | ORAL_TABLET | Freq: Two times a day (BID) | ORAL | Status: DC
  Administered 2016-08-02 – 2016-08-03 (×2): 8.6 mg via ORAL
  Filled 2016-08-02 (×3): qty 1

## 2016-08-02 MED ORDER — ACETAMINOPHEN 325 MG PO TABS: 650.0000 mg | ORAL_TABLET | ORAL | Status: DC | PRN

## 2016-08-02 MED ORDER — LIDOCAINE 2% (20 MG/ML) 5 ML SYRINGE
INTRAMUSCULAR | Status: DC | PRN
  Administered 2016-08-02: 80 mg via INTRAVENOUS

## 2016-08-02 MED ORDER — FUROSEMIDE 20 MG PO TABS
20.0000 mg | ORAL_TABLET | Freq: Every day | ORAL | Status: DC
  Administered 2016-08-02 – 2016-08-03 (×2): 20 mg via ORAL
  Filled 2016-08-02 (×2): qty 1

## 2016-08-02 MED ORDER — ONDANSETRON HCL 4 MG/2ML IJ SOLN: 4.0000 mg | INTRAMUSCULAR | Status: DC | PRN

## 2016-08-02 MED ORDER — BENZONATATE 100 MG PO CAPS: 100.0000 mg | ORAL_CAPSULE | Freq: Three times a day (TID) | ORAL | Status: DC | PRN

## 2016-08-02 MED ORDER — SODIUM CHLORIDE 0.9 % IR SOLN
Status: DC | PRN
  Administered 2016-08-02: 6000 mL

## 2016-08-02 MED ORDER — PROPOFOL 10 MG/ML IV BOLUS
INTRAVENOUS | Status: DC | PRN
  Administered 2016-08-02: 140 mg via INTRAVENOUS
  Administered 2016-08-02: 50 mg via INTRAVENOUS

## 2016-08-02 MED ORDER — LACTATED RINGERS IV SOLN
INTRAVENOUS | Status: DC
  Administered 2016-08-02: 08:00:00 via INTRAVENOUS

## 2016-08-02 MED ORDER — ARTIFICIAL TEARS OP OINT
TOPICAL_OINTMENT | OPHTHALMIC | Status: AC
  Filled 2016-08-02: qty 3.5

## 2016-08-02 MED ORDER — INSULIN ASPART 100 UNIT/ML ~~LOC~~ SOLN: 2.0000 [IU] | Freq: Three times a day (TID) | SUBCUTANEOUS | Status: DC

## 2016-08-02 MED ORDER — SODIUM CHLORIDE 0.9 % IR SOLN
Status: DC | PRN
  Administered 2016-08-02: 500 mL

## 2016-08-02 MED ORDER — ONDANSETRON HCL 4 MG/2ML IJ SOLN
INTRAMUSCULAR | Status: AC
  Filled 2016-08-02: qty 2

## 2016-08-02 MED ORDER — MAGNESIUM HYDROXIDE 400 MG/5ML PO SUSP: 30.0000 mL | Freq: Every day | ORAL | Status: DC | PRN

## 2016-08-02 MED ORDER — FENTANYL CITRATE (PF) 100 MCG/2ML IJ SOLN
INTRAMUSCULAR | Status: AC
  Filled 2016-08-02: qty 2

## 2016-08-02 MED ORDER — LIDOCAINE 2% (20 MG/ML) 5 ML SYRINGE
INTRAMUSCULAR | Status: AC
  Filled 2016-08-02: qty 5

## 2016-08-02 MED ORDER — PIPERACILLIN-TAZOBACTAM 3.375 G IVPB
3.3750 g | Freq: Three times a day (TID) | INTRAVENOUS | Status: DC
  Administered 2016-08-02 – 2016-08-03 (×3): 3.375 g via INTRAVENOUS
  Filled 2016-08-02 (×4): qty 50

## 2016-08-02 MED ORDER — SODIUM CHLORIDE 0.45 % IV SOLN
INTRAVENOUS | Status: DC
  Administered 2016-08-02: 13:00:00 via INTRAVENOUS

## 2016-08-02 MED ORDER — MIDAZOLAM HCL 5 MG/5ML IJ SOLN
INTRAMUSCULAR | Status: DC | PRN
  Administered 2016-08-02 (×2): 1 mg via INTRAVENOUS

## 2016-08-02 SURGICAL SUPPLY — 41 items
BAG URINE DRAINAGE (UROLOGICAL SUPPLIES) ×3 IMPLANT
BAG URINE LEG 500ML (DRAIN) ×1 IMPLANT
BAG URO CATCHER STRL LF (MISCELLANEOUS) ×3 IMPLANT
BLADE SURG 15 STRL LF DISP TIS (BLADE) ×1 IMPLANT
BLADE SURG 15 STRL SS (BLADE) ×3
CATH BONANNO SUPRAPUBIC 14G (CATHETERS) ×1 IMPLANT
CATH FOLEY 2WAY SLVR  5CC 16FR (CATHETERS) ×2
CATH FOLEY 2WAY SLVR  5CC 22FR (CATHETERS) ×2
CATH FOLEY 2WAY SLVR 5CC 16FR (CATHETERS) IMPLANT
CATH FOLEY 2WAY SLVR 5CC 22FR (CATHETERS) IMPLANT
CATH FOLEY INTRO SUPRA 16F (CATHETERS) ×2 IMPLANT
CATH INTERMIT  6FR 70CM (CATHETERS) IMPLANT
CLOTH BEACON ORANGE TIMEOUT ST (SAFETY) ×3 IMPLANT
COVER SURGICAL LIGHT HANDLE (MISCELLANEOUS) ×6 IMPLANT
ELECT PENCIL ROCKER SW 15FT (MISCELLANEOUS) IMPLANT
ELECT REM PT RETURN 9FT ADLT (ELECTROSURGICAL) ×3
ELECTRODE REM PT RTRN 9FT ADLT (ELECTROSURGICAL) ×1 IMPLANT
FIBER LASER FLEXIVA 1000 (UROLOGICAL SUPPLIES) IMPLANT
FIBER LASER FLEXIVA 550 (UROLOGICAL SUPPLIES) IMPLANT
GAUZE SPONGE 4X4 12PLY STRL (GAUZE/BANDAGES/DRESSINGS) ×2 IMPLANT
GLOVE BIOGEL M 8.0 STRL (GLOVE) ×3 IMPLANT
GOWN STRL REUS W/ TWL XL LVL3 (GOWN DISPOSABLE) ×1 IMPLANT
GOWN STRL REUS W/TWL LRG LVL3 (GOWN DISPOSABLE) ×3 IMPLANT
GOWN STRL REUS W/TWL XL LVL3 (GOWN DISPOSABLE) ×9 IMPLANT
GUIDEWIRE AMPLAZ .035X145 (WIRE) ×2 IMPLANT
GUIDEWIRE ANG ZIPWIRE 038X150 (WIRE) IMPLANT
GUIDEWIRE STR DUAL SENSOR (WIRE) ×1 IMPLANT
MANIFOLD NEPTUNE II (INSTRUMENTS) ×3 IMPLANT
NEEDLE HYPO 22GX1.5 SAFETY (NEEDLE) IMPLANT
NS IRRIG 1000ML POUR BTL (IV SOLUTION) ×1 IMPLANT
PACK CYSTO (CUSTOM PROCEDURE TRAY) ×3 IMPLANT
PLUG CATH AND CAP STER (CATHETERS) IMPLANT
SUT ETHILON 2 0 PS N (SUTURE) ×2 IMPLANT
SUT ETHILON 3 0 FSL (SUTURE) ×1 IMPLANT
SYRINGE IRR TOOMEY STRL 70CC (SYRINGE) IMPLANT
TAPE CLOTH SURG 4X10 WHT LF (GAUZE/BANDAGES/DRESSINGS) ×2 IMPLANT
TOWEL OR 17X26 10 PK STRL BLUE (TOWEL DISPOSABLE) ×3 IMPLANT
TRAY PREP A LATEX SAFE STRL (SET/KITS/TRAYS/PACK) ×2 IMPLANT
TUBING CONNECTING 10 (TUBING) ×2 IMPLANT
TUBING CONNECTING 10' (TUBING) ×1
WATER STERILE IRR 3000ML UROMA (IV SOLUTION) ×1 IMPLANT

## 2016-08-02 NOTE — Transfer of Care (Signed)
Immediate Anesthesia Transfer of Care Note  Patient: [name]  Procedure(s) Performed: Procedure(s): INSERTION OF SUPRAPUBIC CATHETER (N/A) CYSTOSCOPY (N/A)  Patient Location: PACU  Anesthesia Type:General  Level of Consciousness: awake, alert , oriented and patient cooperative  Airway & Oxygen Therapy: Patient Spontanous Breathing and Patient connected to face mask oxygen  Post-op Assessment: Report given to RN, Post -op Vital signs reviewed and stable and Patient moving all extremities  Post vital signs: Reviewed and stable  Last Vitals:  Vitals:   08/02/16 0722  Pulse: (!) 120  Resp: 18  Temp: 36.5 C    Last Pain:  Vitals:   08/02/16 0722  TempSrc: Oral      Patients Stated Pain Goal: 3 (08/02/16 0722)  Complications: No apparent anesthesia complications

## 2016-08-02 NOTE — Discharge Instructions (Signed)
Keep suprapubic site clean w/ peroxide daily  Apply neosporin to tube site daily  Do not change catheter until instructed  Notify Dr Retta Dionesahlstedt of significant chages

## 2016-08-02 NOTE — Clinical Social Work Note (Signed)
Clinical Social Work Assessment  Patient Details  Name: [name] MRN: [id number] Date of Birth: Apr 20, 1956  Date of referral:  08/02/16               Reason for consult:  Facility Placement                Permission sought to share information with:  Facility Industrial/product designer granted to share information::  Yes, Verbal Permission Granted  Name::        Agency::     Relationship::     Contact Information:     Housing/Transportation Living arrangements for the past 2 months:  Skilled Nursing Facility Source of Information:  Guardian Dorian Heckle) Patient Interpreter Needed:  None Criminal Activity/Legal Involvement Pertinent to Current Situation/Hospitalization:    Significant Relationships:  Siblings Lives with:  Facility Resident Do you feel safe going back to the place where you live?  Yes Need for family participation in patient care:  Yes (Comment)  Care giving concerns:  Patient has been residing at current SNF ([facility]) since January 2018 and continues to require that level of care. Patient [facility]    Social Worker assessment / plan:  CSW spoke with patient's sister/legal guardian Dorian Heckle). Patient's sister reported that patient will be discharged tomorrow morning and she wants patient to return to [facility]. Patient's sister reported that patient moved to [facility] after being unable to return to previous facility due to catheter. CSW inquired about additional needs for patient, patient's sister reported none.   Employment status:  Disabled (Comment on whether or not currently receiving Disability) Insurance information:  Teacher, English as a foreign language, Medicaid In [place] PT Recommendations:  Not assessed at this time Information / Referral to community resources:  Skilled Nursing Facility  Patient/Family's Response to care:  Patient's sister reported that she is happy with the care patient receives at [facility].     Patient/Family's Understanding of and Emotional Response to Diagnosis, Current Treatment, and Prognosis:  Not addressed, patient's sister was currently busy. CSW informed patient's sister that she could contact CSW for any further questions or needs.   Emotional Assessment Appearance:  Appears stated age Attitude/Demeanor/Rapport:  Unable to Assess Affect (typically observed):  Unable to Assess Orientation:  Oriented to Self Alcohol / Substance use:  Not Applicable Psych involvement (Current and /or in the community):  No (Comment)  Discharge Needs  Concerns to be addressed:  No discharge needs identified Readmission within the last 30 days:  No Current discharge risk:  None Barriers to Discharge:  No Barriers Identified   Antionette Poles, LCSW 08/02/2016, 3:04 PM

## 2016-08-02 NOTE — Interval H&P Note (Signed)
History and Physical Interval Note:  08/02/2016 8:54 AM  [name]  has presented today for surgery, with the diagnosis of BLADDER STONE, NEUROGENIC BLADDER, POSS BLADDER MASS  The various methods of treatment have been discussed with the patient and family. After consideration of risks, benefits and other options for treatment, the patient has consented to  Procedure(s): INSERTION OF SUPRAPUBIC CATHETER (N/A) CYSTOSCOPY WITH LITHOLAPAXY (N/A) CYSTOSCOPY (N/A) as a surgical intervention .  The patient's history has been reviewed, patient examined, no change in status, stable for surgery.  I have reviewed the patient's chart and labs.  Questions were answered to the patient's satisfaction.     Chelsea AusDAHLSTEDT, Alleya Demeter M

## 2016-08-02 NOTE — Op Note (Signed)
Preoperative diagnosis: Neurogenic bladder with incomplete bladder emptying.  Postoperative diagnosis: Same  Principal procedure: Cystoscopy, placement of suprapubic tube-16 French Foley, by suprapubic trocar  Surgeon: Gurman Ashland  Anesthesia: Gen.  Complications: None.  Drains: 16 French suprapubic tube-Foley catheter.  Specimens: None.  Estimated blood loss: Less than 50 mL  Indications: This 61 year old male has a long history of mental retardation, and also urologic history complicated by a nurse Boneta LucksJenny bladder with incomplete emptying.  He also has a stable large left renal calculus that is asymptomatic.  [facility] with a UTI.  He was treated with IV antibiotics.  Urologic consultation was provided.  Thereby the urology service.  This revealed a stable left renal stone without hydronephrosis, a possible bladder mass, possible bladder stone.  The patient followed up with me after his discharge.  Because of significant bladder distention and the need for Foley catheterization, I have recommended, with the patient being in a skilled nursing facility and needing long-term bladder drainage, suprapubic tube placement.  This can be done under anesthesia.  Additionally, at the time of suprapubic tube placement, I have recommended cystoscopy under anesthesia to rule out ladder stone and bladder mass.  I spoke with the patient's sister, Steward DroneBrenda, who is his power of attorney.  She understands the procedure as well as risks and complications.  She desires to proceed.  Description of procedure: The patient was identified in the holding area.  He was taken to the operating room.  He received preoperative IV antibiotics.  He was administered general endotracheal anesthetic.  He was then placed in the dorsolithotomy position.  Genitalia and perineum were prepped and draped.  Proper timeout was performed.  A 21 French panendoscope was advanced with difficulty through his  urethra.  Urethra was difficult to find due to the small size of his penis as well as his uncircumcised state.  The cystoscope was advanced without difficulty through his urethra.  Prostate was nonobstructive.  His bladder was entered and inspected circumferentially.  There were multiple trabeculations and multiple small diverticula.  No bladder masses or urothelial lesions were seen.  No foreign bodies were noted.  His ureteral orifices were difficult to identify due to the trabeculations and multiple small diverticuli.  I irrigated the bladder copiously with antibiotic solution through the cystoscope.  I then drained the bladder, and reintroduced antibiotic solution and distended the bladder with saline.  The cystoscope was removed.  I tried to place the Lowsley retractor through the urethra.  Unfortunate, this was quite difficult and I feel that more than likely a false passage was made.  I then removed this, and placed a cystoscope, it was easily able to and her the bladder again.  I then placed a superstiff guidewire through the scope, and then removed the scope with the bladder distended.  I tried to guide the retractor through the urethra by attaching it to the guidewire.  Unfortunately, this was impossible/quite difficult and after several tries, decided that this was not going to be performed using the Lowsley retractor.  I then obtained a 22-gauge spinal needle.  The patient did have a midline incision down to the pubis.  Just above the pubis into the left of the scar, I passed the spinal needle.  I was able to easily identify the bladder.  The syringe filled with the saline from the irrigation.  I then removed the spinal needle.  I performed a small/1.5 centimeter incision just near this  suprapubic puncture.  I then used the 16 French suprapubic trocar to puncture the subcutaneous tissue, passed through the rectus fascia, and into the bladder.  Once I entered the bladder, a copious amount of saline came  through the trocar sheath.  I then removed the trocar and passed a 16 French Foley catheter, easily through this.  It passed into the bladder.  The balloon was filled with 25 mL of water.  Cystoscopically, I then identified the catheter balloon within the bladder.  The catheter was then hooked to dependent drainage.  I then sutured the catheter to the suprapubic area using a 0 nylon suture.  That same nylon was used to close the small incision, using a vertical mattress suture.  At this point, dry sterile dressing was placed.  The patient was then transferred to the PACU stretcher, and then awakened.  He was then taken to the PACU in stable condition.  He tolerated the procedure well.  The patient will be left in the hospital for overnight observation as a routine precaution due to his mental status and the surgery.

## 2016-08-02 NOTE — Anesthesia Preprocedure Evaluation (Addendum)
Anesthesia Evaluation  Patient identified by MRN, date of birth, ID bandGeneral Assessment Comment:Severe intellectual disability      Muscle weakness (generalized)   Cognitive communication deficit   Bladder mass   Severe mental retardation      Reviewed: Allergy & Precautions, NPO status , Patient's Chart, lab work & pertinent test results, Unable to perform ROS - Chart review only  Airway Mallampati: II  TM Distance: >3 FB Neck ROM: Full    Dental no notable dental hx.    Pulmonary neg pulmonary ROS,    Pulmonary exam normal breath sounds clear to auscultation       Cardiovascular hypertension, negative cardio ROS Normal cardiovascular exam Rhythm:Regular Rate:Normal     Neuro/Psych negative neurological ROS  negative psych ROS   GI/Hepatic negative GI ROS, Neg liver ROS,   Endo/Other  diabetesHypothyroidism   Renal/GU negative Renal ROS  negative genitourinary   Musculoskeletal negative musculoskeletal ROS (+)   Abdominal   Peds negative pediatric ROS (+)  Hematology negative hematology ROS (+)   Anesthesia Other Findings   Reproductive/Obstetrics negative OB ROS                             Anesthesia Physical Anesthesia Plan  ASA: III  Anesthesia Plan: General   Post-op Pain Management:    Induction: Intravenous  Airway Management Planned: LMA  Additional Equipment:   Intra-op Plan:   Post-operative Plan: Extubation in OR  Informed Consent: I have reviewed the patients History and Physical, chart, labs and discussed the procedure including the risks, benefits and alternatives for the proposed anesthesia with the patient or authorized representative who has indicated his/her understanding and acceptance.   Dental advisory given  Plan Discussed with: CRNA and Surgeon  Anesthesia Plan Comments:         Anesthesia Quick Evaluation

## 2016-08-02 NOTE — Progress Notes (Signed)
Post-op note  Subjective: The patient is doing well.  No complaints, has no pain  Objective: Vital signs in last 24 hours: Temp:  [97.7 F (36.5 C)-97.8 F (36.6 C)] 97.8 F (36.6 C) (03/05 1027) Pulse Rate:  [93-120] 104 (03/05 1218) Resp:  [10-18] 15 (03/05 1200) BP: (125-156)/(85-109) 156/98 (03/05 1230) SpO2:  [91 %-99 %] 96 % (03/05 1218) Weight:  [62.6 kg (138 lb)] 62.6 kg (138 lb) (03/05 0722)  Intake/Output from previous day: No intake/output data recorded. Intake/Output this shift: Total I/O In: 1450 [I.V.:1400; IV Piggyback:50] Out: 50 [Urine:50]  Physical Exam:  General: Alert and oriented. Abdomen: Soft, Nondistended. Dressing dry. Belly nontender.   Lab Results: No results for input(s): HGB, HCT in the last 72 hours.  Assessment/Plan: POD#0   1) Continue to monitor 2) Home in am if he does Jacqlyn KraussK   Paisly Fingerhut M. Koren Plyler, MD   LOS: 0 days   Chelsea AusDAHLSTEDT, Leighton Luster M 08/02/2016, 6:43 PM

## 2016-08-02 NOTE — Anesthesia Procedure Notes (Signed)
Procedure Name: LMA Insertion Date/Time: 08/02/2016 9:04 AM Performed by: Jarvis NewcomerARMISTEAD, Danyal Whitenack A Pre-anesthesia Checklist: Patient identified, Emergency Drugs available, Suction available, Patient being monitored and Timeout performed Patient Re-evaluated:Patient Re-evaluated prior to inductionOxygen Delivery Method: Circle system utilized Preoxygenation: Pre-oxygenation with 100% oxygen Intubation Type: IV induction Ventilation: Mask ventilation without difficulty LMA: LMA flexible inserted LMA Size: 4.0 Number of attempts: 1 Placement Confirmation: positive ETCO2 and breath sounds checked- equal and bilateral Tube secured with: Tape Dental Injury: Teeth and Oropharynx as per pre-operative assessment

## 2016-08-02 NOTE — H&P (Signed)
Urology History and Physical Exam  CC: Neurogenic bladder  HPI: 61 year old male with long-term mental disability. He has a neurogenic bladder and has had incomplete emptying for many years. He has had recent decompensation of his bladder with recent UTI and admission to [facility] where he was found to have a possible bladder mass on CT (not seen on followup U/S) and a stable left renal stone. HE now has an indwelling catheter. He more than likely will need an indwelling catheter permanently for bladder drainage. He presents now for cystoscopy, possible cystolitholapaxy (possible bladder stone) and sp tube placement.  PMH: Past Medical History:  Diagnosis Date  . Arthritis   . Bladder mass   . BPH (benign prostatic hyperplasia)   . CHF (congestive heart failure) (HCC)   . Chronic cough   . Cognitive communication deficit   . Complication of anesthesia    agitaton after anesthesia  . Diabetes mellitus    type2  . DKA (diabetic ketoacidoses) (HCC)   . Dysphagia   . Encephalopathy   . Family history of adverse reaction to anesthesia    sister hard to wake up  . Gait abnormality   . Gram-positive bacteremia   . History of recurrent UTIs   . Hypernatremia   . Hypertension   . Hypokalemia   . Hypothyroidism   . MR (mental retardation)   . Muscle weakness (generalized)   . Postural kyphosis   . Renal disorder   . Sepsis(995.91)   . Severe intellectual disability   . Severe mental retardation   . Urinary retention   . UTI (urinary tract infection)     PSH: Past Surgical History:  Procedure Laterality Date  . KIDNEY STONE SURGERY     x 1  . tooth extraction surgery  as child    Allergies: No Known Allergies  Medications: Prescriptions Prior to Admission  Medication Sig Dispense Refill Last Dose  . acetaminophen (TYLENOL) 500 MG tablet Take 500 mg by mouth every 6 (six) hours as needed.   unknown  . Alum & Mag Hydroxide-Simeth (ANTACID & ANTIGAS) 200-200-20  MG/5ML SUSP Take 30 mLs by mouth daily as needed (heartburn).   unknown at Unknown time  . benzonatate (TESSALON) 100 MG capsule Take 100 mg by mouth every 8 (eight) hours as needed for cough.   unknown  . furosemide (LASIX) 20 MG tablet Take 20 mg by mouth daily.     . insulin aspart (NOVOLOG) 100 UNIT/ML injection Inject 2-10 Units into the skin 3 (three) times daily before meals. If 151-200 = 2 units, notify NP/MD if <60,   201-250 = 4 units, 251-300 = 6 units, 301-350 = 8 units, 351-400 = 10 units Notify MD/NP if > 400     . insulin glargine (LANTUS) 100 UNIT/ML injection Inject 0.1 mLs (10 Units total) into the skin at bedtime. (Patient taking differently: Inject 10 Units into the skin 2 (two) times daily. )   05/17/2016 at Unknown time  . labetalol (NORMODYNE) 300 MG tablet Take 300 mg by mouth 2 (two) times daily. Hold if systolic BP is less than 100   05/17/2016 at 1130  . levothyroxine (SYNTHROID, LEVOTHROID) 50 MCG tablet Take 50 mcg by mouth daily before breakfast.   05/17/2016 at Unknown time  . magnesium hydroxide (MILK OF MAGNESIA) 400 MG/5ML suspension Take 30 mLs by mouth daily as needed for mild constipation.   unknown at Unknown time  . Potassium Chloride 40 MEQ/15ML (20%) SOLN Take  7.5 mLs by mouth daily.   05/17/2016 at Unknown time  . tamsulosin (FLOMAX) 0.4 MG CAPS capsule Take 0.4 mg by mouth daily. Hold if systolic BP is less than 100   05/17/2016 at Unknown time  . tolterodine (DETROL) 1 MG tablet Take 1 mg by mouth daily as needed (bladder).   unknown  . Magnesium Sulfate, Laxative, (EPSOM SALT) GRAN by Does not apply route. Soak both feet in lukewarm epsom salt water 2-3 times per week for 10-15 minutes.   Past Week at Unknown time     Social History: Social History   Social History  . Marital status: Married    Spouse name: N/A  . Number of children: N/A  . Years of education: N/A   Occupational History  . Not on file.   Social History Main Topics  . Smoking  status: Never Smoker  . Smokeless tobacco: Never Used  . Alcohol use No  . Drug use: No  . Sexual activity: No   Other Topics Concern  . Not on file   Social History Narrative  . No narrative on file    Family History: History reviewed. No pertinent family history.  Review of Systems: Positive: Urinary retention, cognitive dysfunction Negative: A further 10 point review of systems was negative except what is listed in the HPI.                  Physical Exam: @VITALS2 @ General: No acute distress.  Awake. Head:  Normocephalic.  Atraumatic. ENT:  EOMI.  Mucous membranes moist Neck:  Supple.  No lymphadenopathy. CV:  S1 present. S2 present. Regular rate. Pulmonary: Equal effort bilaterally.  Clear to auscultation bilaterally. Abdomen: Soft.  Non tender to palpation. Skin:  Normal turgor.  No visible rash. Extremity: No gross deformity of bilateral upper extremities.  No gross deformity of                             lower extremities. Neurologic: Alert. Appropriate mood.    Studies:  No results for input(s): HGB, WBC, PLT in the last 72 hours.  No results for input(s): NA, K, CL, CO2, BUN, CREATININE, CALCIUM, GFRNONAA, GFRAA in the last 72 hours.  Invalid input(s): MAGNESIUM   No results for input(s): INR, APTT in the last 72 hours.  Invalid input(s): PT   Invalid input(s): ABG    Assessment:  Possible bladder stone, possible bladder mass, NGB  Plan: Cystoscopy, possible cystolihtalopaxy, sp tube placement

## 2016-08-02 NOTE — Anesthesia Postprocedure Evaluation (Addendum)
Anesthesia Post Note  Patient: [name]  Procedure(s) Performed: Procedure(s) (LRB): INSERTION OF SUPRAPUBIC CATHETER (N/A) CYSTOSCOPY (N/A)  Patient location during evaluation: PACU Anesthesia Type: General Level of consciousness: awake and alert Pain management: pain level controlled Vital Signs Assessment: post-procedure vital signs reviewed and stable Respiratory status: spontaneous breathing, nonlabored ventilation, respiratory function stable and patient connected to nasal cannula oxygen Cardiovascular status: blood pressure returned to baseline and stable Postop Assessment: no signs of nausea or vomiting Anesthetic complications: no       Last Vitals:  Vitals:   08/02/16 1045 08/02/16 1100  BP: (!) 153/93 (!) 144/98  Pulse: 93 (!) 103  Resp: 17 13  Temp:      Last Pain:  Vitals:   08/02/16 0722  TempSrc: Oral                 Ted Leonhart S

## 2016-08-02 NOTE — NC FL2 (Signed)
[place] MEDICAID FL2 LEVEL OF CARE SCREENING TOOL     IDENTIFICATION  Patient Name: [name] Birthdate: 1955-07-26 Sex: male Admission Date (Current Location): 08/02/2016  [facility] and [place] Number:  Geophysical data processor and Address:  [facility],  [address], [place] [address]      Provider Number: [id number]  Attending Physician Name and Address:  Marcine Matar, MD  Relative Name and Phone Number:       Current Level of Care: Hospital Recommended Level of Care: Skilled Nursing Facility Prior Approval Number:    Date Approved/Denied:   PASRR Number: [id number] F expires 10/17/2016  Discharge Plan: SNF    Current Diagnoses: Patient Active Problem List   Diagnosis Date Noted  . Neurogenic bladder 08/02/2016  . Sepsis (HCC) 11/02/2015  . UTI (urinary tract infection) 11/02/2015  . Sinus tachycardia 11/02/2015  . Dehydration 11/02/2015  . Leukocytosis 11/02/2015  . DM type 2 causing eye disease, not at goal [facility]) 11/02/2015  . Hypokalemia 03/04/2014  . Bacteremia due to Gram-positive bacteria 06/03/2011  . Hypernatremia 06/03/2011  . Hyperkalemia 05/29/2011  . DKA (diabetic ketoacidoses) (HCC) 05/29/2011  . Renal failure 05/29/2011  . UTI (lower urinary tract infection) 05/29/2011    Orientation RESPIRATION BLADDER Height & Weight     Self  Normal Incontinent, Indwelling catheter Weight: 138 lb (62.6 kg) Height:  5\' 8"  (172.7 cm)  BEHAVIORAL SYMPTOMS/MOOD NEUROLOGICAL BOWEL NUTRITION STATUS      Continent Diet (Carb Modified)  AMBULATORY STATUS COMMUNICATION OF NEEDS Skin   Extensive Assist Verbally Surgical wounds (Incision (Closed) 08/02/16 Abdomen)                       Personal Care Assistance Level of Assistance  Bathing, Dressing Bathing Assistance: Limited assistance   Dressing Assistance: Limited assistance     Functional Limitations Info             SPECIAL CARE FACTORS FREQUENCY                        Contractures      Additional Factors Info  Code Status, Allergies Code Status Info: Fullcode Allergies Info: NKDA           Current Medications (08/02/2016):  This is the current hospital active medication list Current Facility-Administered Medications  Medication Dose Route Frequency Provider Last Rate Last Dose  . 0.45 % sodium chloride infusion   Intravenous Continuous Marcine Matar, MD 50 mL/hr at 08/02/16 1311    . acetaminophen (TYLENOL) tablet 500 mg  500 mg Oral Q6H PRN Marcine Matar, MD      . acetaminophen (TYLENOL) tablet 650 mg  650 mg Oral Q4H PRN Marcine Matar, MD      . alum & mag hydroxide-simeth (MAALOX/MYLANTA) 200-200-20 MG/5ML suspension 30 mL  30 mL Oral Daily PRN Marcine Matar, MD      . benzonatate (TESSALON) capsule 100 mg  100 mg Oral Q8H PRN Marcine Matar, MD      . furosemide (LASIX) tablet 20 mg  20 mg Oral Daily Marcine Matar, MD      . insulin aspart (novoLOG) injection 0-5 Units  0-5 Units Subcutaneous QHS Marcine Matar, MD      . insulin aspart (novoLOG) injection 0-9 Units  0-9 Units Subcutaneous TID WC Marcine Matar, MD      . labetalol (NORMODYNE) tablet 300 mg  300 mg Oral BID Marcine Matar, MD      .  levothyroxine (SYNTHROID, LEVOTHROID) tablet 50 mcg  50 mcg Oral QAC breakfast Marcine Matar, MD      . magnesium hydroxide (MILK OF MAGNESIA) suspension 30 mL  30 mL Oral Daily PRN Marcine Matar, MD      . ondansetron [facility]) injection 4 mg  4 mg Intravenous Q4H PRN Marcine Matar, MD      . oxybutynin (DITROPAN-XL) 24 hr tablet 10 mg  10 mg Oral QHS Marcine Matar, MD      . piperacillin-tazobactam (ZOSYN) IVPB 3.375 g  3.375 g Intravenous Q8H Marcine Matar, MD      . potassium chloride 20 MEQ/15ML (10%) solution 40 mEq  40 mEq Oral Daily Marcine Matar, MD      . Gwyndolyn [facility]) tablet 8.6 mg  1 tablet Oral BID Marcine Matar, MD         Discharge Medications: Please see  discharge summary for a list of discharge medications.  Relevant Imaging Results:  Relevant Lab Results:   Additional Information SS#: [id number]   Arlyss Repress, LCSW

## 2016-08-03 DIAGNOSIS — R0982 Postnasal drip: Secondary | ICD-10-CM | POA: Diagnosis not present

## 2016-08-03 DIAGNOSIS — R531 Weakness: Secondary | ICD-10-CM | POA: Diagnosis not present

## 2016-08-03 DIAGNOSIS — N390 Urinary tract infection, site not specified: Secondary | ICD-10-CM | POA: Diagnosis not present

## 2016-08-03 DIAGNOSIS — D72828 Other elevated white blood cell count: Secondary | ICD-10-CM | POA: Diagnosis not present

## 2016-08-03 DIAGNOSIS — Z794 Long term (current) use of insulin: Secondary | ICD-10-CM | POA: Diagnosis not present

## 2016-08-03 DIAGNOSIS — N400 Enlarged prostate without lower urinary tract symptoms: Secondary | ICD-10-CM | POA: Diagnosis not present

## 2016-08-03 DIAGNOSIS — N3289 Other specified disorders of bladder: Secondary | ICD-10-CM | POA: Diagnosis not present

## 2016-08-03 DIAGNOSIS — I110 Hypertensive heart disease with heart failure: Secondary | ICD-10-CM | POA: Diagnosis not present

## 2016-08-03 DIAGNOSIS — E119 Type 2 diabetes mellitus without complications: Secondary | ICD-10-CM | POA: Diagnosis not present

## 2016-08-03 DIAGNOSIS — F72 Severe intellectual disabilities: Secondary | ICD-10-CM | POA: Diagnosis not present

## 2016-08-03 DIAGNOSIS — N401 Enlarged prostate with lower urinary tract symptoms: Secondary | ICD-10-CM | POA: Diagnosis not present

## 2016-08-03 DIAGNOSIS — Z87440 Personal history of urinary (tract) infections: Secondary | ICD-10-CM | POA: Diagnosis not present

## 2016-08-03 DIAGNOSIS — E039 Hypothyroidism, unspecified: Secondary | ICD-10-CM | POA: Diagnosis not present

## 2016-08-03 DIAGNOSIS — A419 Sepsis, unspecified organism: Secondary | ICD-10-CM | POA: Diagnosis not present

## 2016-08-03 DIAGNOSIS — M4000 Postural kyphosis, site unspecified: Secondary | ICD-10-CM | POA: Diagnosis not present

## 2016-08-03 DIAGNOSIS — I5089 Other heart failure: Secondary | ICD-10-CM | POA: Diagnosis not present

## 2016-08-03 DIAGNOSIS — R339 Retention of urine, unspecified: Secondary | ICD-10-CM | POA: Diagnosis not present

## 2016-08-03 DIAGNOSIS — R69 Illness, unspecified: Secondary | ICD-10-CM | POA: Diagnosis not present

## 2016-08-03 DIAGNOSIS — I10 Essential (primary) hypertension: Secondary | ICD-10-CM | POA: Diagnosis not present

## 2016-08-03 DIAGNOSIS — E876 Hypokalemia: Secondary | ICD-10-CM | POA: Diagnosis not present

## 2016-08-03 DIAGNOSIS — I509 Heart failure, unspecified: Secondary | ICD-10-CM | POA: Diagnosis not present

## 2016-08-03 DIAGNOSIS — R6889 Other general symptoms and signs: Secondary | ICD-10-CM | POA: Diagnosis not present

## 2016-08-03 DIAGNOSIS — R338 Other retention of urine: Secondary | ICD-10-CM | POA: Diagnosis not present

## 2016-08-03 DIAGNOSIS — R278 Other lack of coordination: Secondary | ICD-10-CM | POA: Diagnosis not present

## 2016-08-03 DIAGNOSIS — A4189 Other specified sepsis: Secondary | ICD-10-CM | POA: Diagnosis not present

## 2016-08-03 DIAGNOSIS — E860 Dehydration: Secondary | ICD-10-CM | POA: Diagnosis not present

## 2016-08-03 DIAGNOSIS — G9340 Encephalopathy, unspecified: Secondary | ICD-10-CM | POA: Diagnosis not present

## 2016-08-03 DIAGNOSIS — N319 Neuromuscular dysfunction of bladder, unspecified: Secondary | ICD-10-CM | POA: Diagnosis not present

## 2016-08-03 DIAGNOSIS — M1990 Unspecified osteoarthritis, unspecified site: Secondary | ICD-10-CM | POA: Diagnosis not present

## 2016-08-03 LAB — GLUCOSE, CAPILLARY: GLUCOSE-CAPILLARY: 137 mg/dL — AB (ref 65–99)

## 2016-08-03 NOTE — Progress Notes (Signed)
CSW spoke with staff from [facility]- regarding patient's discharge. Staff member Renea Eevelyn reported that she would coordinate transportation for patient and reported that patient's sister requested transportation for 10am. CSW sent discharge documents via HUB to facility. CSW notified patient's RN that patient's SNF arranged transportation for patient. CSW notified SNF that patient would be ready around 10:30am. CSW contacted and informed patient's sister about patient's transportation. CSW signing off, if any new needs arise please consult.

## 2016-08-03 NOTE — Discharge Summary (Signed)
Physician Discharge Summary  Patient ID: Jacob GavelGene R [name] MRN: [id number] DOB/AGE: 1955/11/01 61 y.o.  Admit date: 08/02/2016 Discharge date: 08/03/2016  Admission Diagnoses:  Neurogenic bladder  Discharge Diagnoses:  Principal Problem:   Neurogenic bladder   Past Medical History:  Diagnosis Date  . Arthritis   . Bladder mass   . BPH (benign prostatic hyperplasia)   . CHF (congestive heart failure) (HCC)   . Chronic cough   . Cognitive communication deficit   . Complication of anesthesia    agitaton after anesthesia  . Diabetes mellitus    type2  . DKA (diabetic ketoacidoses) (HCC)   . Dysphagia   . Encephalopathy   . Family history of adverse reaction to anesthesia    sister hard to wake up  . Gait abnormality   . Gram-positive bacteremia   . History of recurrent UTIs   . Hypernatremia   . Hypertension   . Hypokalemia   . Hypothyroidism   . MR (mental retardation)   . Muscle weakness (generalized)   . Postural kyphosis   . Renal disorder   . Sepsis(995.91)   . Severe intellectual disability   . Severe mental retardation   . Urinary retention   . UTI (urinary tract infection)     Surgeries: Procedure(s): INSERTION OF SUPRAPUBIC CATHETER CYSTOSCOPY on 08/02/2016   Consultants (if any):   Discharged Condition: Improved  Hospital Course: [name] is an 61 y.o. male who was admitted 08/02/2016 with a diagnosis of Neurogenic bladder and went to the operating room on 08/02/2016 and underwent the above named procedures.  He is doing well this morning and the tube is draining well with a dry dressing.   He was given perioperative antibiotics:  Anti-infectives    Start     Dose/Rate Route Frequency Ordered Stop   08/02/16 1400  piperacillin-tazobactam (ZOSYN) IVPB 3.375 g     3.375 g 12.5 mL/hr over 240 Minutes Intravenous Every 8 hours 08/02/16 1230     08/02/16 0941  polymyxin B 500,000 Units, bacitracin 50,000 Units in sodium chloride irrigation 0.9 % 500 mL  irrigation  Status:  Discontinued       As needed 08/02/16 0941 08/02/16 1023   08/02/16 0708  ceFAZolin (ANCEF) IVPB 2g/100 mL premix     2 g 200 mL/hr over 30 Minutes Intravenous 30 min pre-op 08/02/16 0708 08/02/16 0906    .  He was given sequential compression devices for DVT prophylaxis.  He benefited maximally from the hospital stay and there were no complications.    Recent vital signs:  Vitals:   08/02/16 2303 08/03/16 0353  BP:  92/68  Pulse:  88  Resp:  18  Temp: 98.1 F (36.7 C) 97.3 F (36.3 C)    Recent laboratory studies:  Lab Results  Component Value Date   HGB 12.8 (L) 05/17/2016   HGB 14.2 11/06/2015   HGB 13.5 11/03/2015   Lab Results  Component Value Date   WBC 9.6 05/17/2016   PLT 217 05/17/2016   Lab Results  Component Value Date   INR 1.16 11/02/2015   Lab Results  Component Value Date   NA 136 05/17/2016   K 4.0 05/17/2016   CL 105 05/17/2016   CO2 25 05/17/2016   BUN 16 05/17/2016   CREATININE 1.01 05/17/2016   GLUCOSE 168 (H) 05/17/2016    Discharge Medications:   Allergies as of 08/03/2016   No Known Allergies     Medication List  TAKE these medications   acetaminophen 500 MG tablet Commonly known as:  TYLENOL Take 500 mg by mouth every 6 (six) hours as needed.   Antacid & [place]ig[place] 200-200-20 MG/5ML Susp Take 30 mLs by mouth daily as needed (heartburn).   benzonatate 100 MG capsule Commonly known as:  TESSALON Take 100 mg by mouth every 8 (eight) hours as needed for cough.   EPSOM SALT [place] Generic drug:  Magnesium Sulfate (Laxative) by Does not apply route. Soak both feet in lukewarm epsom salt water 2-3 times per week for 10-15 minutes.   furosemide 20 MG tablet Commonly known as:  LASIX Take 20 mg by mouth daily.   insulin aspart 100 UNIT/ML injection Commonly known as:  novoLOG Inject 2-10 Units into the skin 3 (three) times daily before meals. If 151-200 = 2 units, notify NP/MD if <60,   201-250 = 4 units,  251-300 = 6 units, 301-350 = 8 units, 351-400 = 10 units Notify MD/NP if > 400   insulin glargine 100 UNIT/ML injection Commonly known as:  LANTUS Inject 0.1 mLs (10 Units total) into the skin at bedtime. What changed:  when to take this   labetalol 300 MG tablet Commonly known as:  NORMODYNE Take 300 mg by mouth 2 (two) times daily. Hold if systolic BP is less than 100   levothyroxine 50 MCG tablet Commonly known as:  SYNTHROID, LEVOTHROID Take 50 mcg by mouth daily before breakfast.   magnesium hydroxide 400 MG/5ML suspension Commonly known as:  MILK OF MAGNESIA Take 30 mLs by mouth daily as needed for mild constipation.   Potassium Chloride 40 MEQ/15ML (20%) Soln Take 7.5 mLs by mouth daily.   tamsulosin 0.4 MG Caps capsule Commonly known as:  FLOMAX Take 0.4 mg by mouth daily. Hold if systolic BP is less than 100   tolterodine 1 MG tablet Commonly known as:  DETROL Take 1 mg by mouth daily as needed (bladder).       Diagnostic Studies: US Renal  Result Date: 07/14/2016 CLINICAL DATA:  61 y/o  M; rule out kidney stones and cysts. EXAM: RENAL / URINARY TRACT ULTRASOUND COMPLETE COMPARISON:  11/07/2015 abdomen radiographs. Ultrasound dated 05/31/2011. FINDINGS: Right Kidney: Length: 10.2 cm. Anechoic structure with enhanced through transmission compatible with simple cyst measuring up to 1.3 cm. Left Kidney: Length: 11.5 cm. Upper pole echogenic focus with distal acoustic shadowing compatible stone measuring 19 mm. Bladder: Appears normal for degree of bladder distention. IMPRESSION: Stable right kidney cyst and stable left kidney stone allowing for differences in technique. Electronically Signed   By: Mitzi Hansen M.D.   On: 07/14/2016 19:20    Disposition: 01-Home or Self Care  Discharge Instructions    Discontinue IV    Complete by:  As directed       Follow-up Information    DAHLSTEDT, Bertram Millard, MD Follow up.   Specialty:  Urology Why:  as  scheduled Contact information: [address] [place] [address] [phone]            Signed: Anner Crete 08/03/2016, 7:15 AM

## 2016-08-03 NOTE — Progress Notes (Signed)
Report called to [facility] in [place]. Updates given to nursing staff.  AVS sent with pt. [facility] sister/legal guardian. Maeola Harmanark, Syniah Berne Johnson

## 2016-08-04 DIAGNOSIS — E039 Hypothyroidism, unspecified: Secondary | ICD-10-CM | POA: Diagnosis not present

## 2016-08-04 DIAGNOSIS — E119 Type 2 diabetes mellitus without complications: Secondary | ICD-10-CM | POA: Diagnosis not present

## 2016-08-04 DIAGNOSIS — I5089 Other heart failure: Secondary | ICD-10-CM | POA: Diagnosis not present

## 2016-08-04 LAB — HIV ANTIBODY (ROUTINE TESTING W REFLEX): HIV Screen 4th Generation wRfx: NONREACTIVE

## 2016-08-09 DIAGNOSIS — E876 Hypokalemia: Secondary | ICD-10-CM | POA: Diagnosis not present

## 2016-08-09 DIAGNOSIS — E860 Dehydration: Secondary | ICD-10-CM | POA: Diagnosis not present

## 2016-08-09 DIAGNOSIS — D72828 Other elevated white blood cell count: Secondary | ICD-10-CM | POA: Diagnosis not present

## 2016-08-09 DIAGNOSIS — E119 Type 2 diabetes mellitus without complications: Secondary | ICD-10-CM | POA: Diagnosis not present

## 2016-08-12 DIAGNOSIS — R531 Weakness: Secondary | ICD-10-CM | POA: Diagnosis not present

## 2016-08-12 DIAGNOSIS — R0982 Postnasal drip: Secondary | ICD-10-CM | POA: Diagnosis not present

## 2016-08-19 DIAGNOSIS — R531 Weakness: Secondary | ICD-10-CM | POA: Diagnosis not present

## 2016-08-19 DIAGNOSIS — R0982 Postnasal drip: Secondary | ICD-10-CM | POA: Diagnosis not present

## 2016-08-20 DIAGNOSIS — E1142 Type 2 diabetes mellitus with diabetic polyneuropathy: Secondary | ICD-10-CM | POA: Diagnosis not present

## 2016-08-20 DIAGNOSIS — L603 Nail dystrophy: Secondary | ICD-10-CM | POA: Diagnosis not present

## 2016-08-29 DIAGNOSIS — G9340 Encephalopathy, unspecified: Secondary | ICD-10-CM | POA: Diagnosis not present

## 2016-08-29 DIAGNOSIS — R278 Other lack of coordination: Secondary | ICD-10-CM | POA: Diagnosis not present

## 2016-08-29 DIAGNOSIS — M6281 Muscle weakness (generalized): Secondary | ICD-10-CM | POA: Diagnosis not present

## 2016-09-02 DIAGNOSIS — R8290 Unspecified abnormal findings in urine: Secondary | ICD-10-CM | POA: Diagnosis not present

## 2016-09-02 DIAGNOSIS — R627 Adult failure to thrive: Secondary | ICD-10-CM | POA: Diagnosis not present

## 2016-09-04 DIAGNOSIS — R319 Hematuria, unspecified: Secondary | ICD-10-CM | POA: Diagnosis not present

## 2016-09-04 DIAGNOSIS — N390 Urinary tract infection, site not specified: Secondary | ICD-10-CM | POA: Diagnosis not present

## 2016-09-04 DIAGNOSIS — Z79899 Other long term (current) drug therapy: Secondary | ICD-10-CM | POA: Diagnosis not present

## 2016-09-06 DIAGNOSIS — N390 Urinary tract infection, site not specified: Secondary | ICD-10-CM | POA: Diagnosis not present

## 2016-09-06 DIAGNOSIS — R627 Adult failure to thrive: Secondary | ICD-10-CM | POA: Diagnosis not present

## 2016-09-06 DIAGNOSIS — R8290 Unspecified abnormal findings in urine: Secondary | ICD-10-CM | POA: Diagnosis not present

## 2016-09-06 DIAGNOSIS — D649 Anemia, unspecified: Secondary | ICD-10-CM | POA: Diagnosis not present

## 2016-09-14 ENCOUNTER — Ambulatory Visit (INDEPENDENT_AMBULATORY_CARE_PROVIDER_SITE_OTHER): Payer: Medicare Other | Admitting: Urology

## 2016-09-14 DIAGNOSIS — N312 Flaccid neuropathic bladder, not elsewhere classified: Secondary | ICD-10-CM

## 2016-09-16 DIAGNOSIS — R627 Adult failure to thrive: Secondary | ICD-10-CM | POA: Diagnosis not present

## 2016-09-16 DIAGNOSIS — R8290 Unspecified abnormal findings in urine: Secondary | ICD-10-CM | POA: Diagnosis not present

## 2016-09-28 DIAGNOSIS — M6281 Muscle weakness (generalized): Secondary | ICD-10-CM | POA: Diagnosis not present

## 2016-09-28 DIAGNOSIS — R278 Other lack of coordination: Secondary | ICD-10-CM | POA: Diagnosis not present

## 2016-09-28 DIAGNOSIS — G9340 Encephalopathy, unspecified: Secondary | ICD-10-CM | POA: Diagnosis not present

## 2016-09-29 DIAGNOSIS — E1342 Other specified diabetes mellitus with diabetic polyneuropathy: Secondary | ICD-10-CM | POA: Diagnosis not present

## 2016-10-12 ENCOUNTER — Ambulatory Visit (INDEPENDENT_AMBULATORY_CARE_PROVIDER_SITE_OTHER): Payer: Medicare Other | Admitting: Urology

## 2016-10-12 DIAGNOSIS — R3121 Asymptomatic microscopic hematuria: Secondary | ICD-10-CM

## 2016-10-18 ENCOUNTER — Emergency Department (HOSPITAL_COMMUNITY)
Admission: EM | Admit: 2016-10-18 | Discharge: 2016-10-18 | Disposition: A | Discharge status: Home / Self Care | Payer: Medicare Other | Attending: Emergency Medicine | Admitting: Emergency Medicine

## 2016-10-18 ENCOUNTER — Encounter (HOSPITAL_COMMUNITY): Payer: Self-pay | Admitting: Emergency Medicine

## 2016-10-18 DIAGNOSIS — Z960 Presence of urogenital implants: Secondary | ICD-10-CM | POA: Insufficient documentation

## 2016-10-18 DIAGNOSIS — T83090A Other mechanical complication of cystostomy catheter, initial encounter: Secondary | ICD-10-CM | POA: Diagnosis not present

## 2016-10-18 DIAGNOSIS — E119 Type 2 diabetes mellitus without complications: Secondary | ICD-10-CM | POA: Insufficient documentation

## 2016-10-18 DIAGNOSIS — Y733 Surgical instruments, materials and gastroenterology and urology devices (including sutures) associated with adverse incidents: Secondary | ICD-10-CM | POA: Diagnosis not present

## 2016-10-18 DIAGNOSIS — Z9359 Other cystostomy status: Secondary | ICD-10-CM

## 2016-10-18 DIAGNOSIS — T83010A Breakdown (mechanical) of cystostomy catheter, initial encounter: Secondary | ICD-10-CM

## 2016-10-18 DIAGNOSIS — Z794 Long term (current) use of insulin: Secondary | ICD-10-CM | POA: Diagnosis not present

## 2016-10-18 DIAGNOSIS — I509 Heart failure, unspecified: Secondary | ICD-10-CM | POA: Insufficient documentation

## 2016-10-18 DIAGNOSIS — Z79899 Other long term (current) drug therapy: Secondary | ICD-10-CM | POA: Insufficient documentation

## 2016-10-18 DIAGNOSIS — I110 Hypertensive heart disease with heart failure: Secondary | ICD-10-CM | POA: Diagnosis not present

## 2016-10-18 DIAGNOSIS — R4182 Altered mental status, unspecified: Secondary | ICD-10-CM | POA: Diagnosis not present

## 2016-10-18 DIAGNOSIS — T83198A Other mechanical complication of other urinary devices and implants, initial encounter: Secondary | ICD-10-CM | POA: Diagnosis not present

## 2016-10-18 NOTE — ED Provider Notes (Signed)
[facility]-EMERGENCY DEPT Provider Note   CSN: [id number] Arrival date & time: 10/18/16  1227     History   Chief Complaint Chief Complaint  Patient presents with  . Suprapubic Catheter Problem    HPI [name] is a 61 y.o. male.  Patient lives in a skilled nursing facility.  Today staff tried to put the suprapubic catheter, and, which had fallen out during the night.  They were unable to, so sent him here.  Suprapubic catheter initially placed, March 2018 for difficulty voiding.  Apparently the catheter was replaced yesterday.  It was also replaced 2 weeks ago, by his urologist.  Level 5 caveat-mental retardation  HPI  Past Medical History:  Diagnosis Date  . Arthritis   . Bladder mass   . BPH (benign prostatic hyperplasia)   . CHF (congestive heart failure) (HCC)   . Chronic cough   . Cognitive communication deficit   . Complication of anesthesia    agitaton after anesthesia  . Diabetes mellitus    type2  . DKA (diabetic ketoacidoses) (HCC)   . Dysphagia   . Encephalopathy   . Family history of adverse reaction to anesthesia    sister hard to wake up  . Gait abnormality   . Gram-positive bacteremia   . History of recurrent UTIs   . Hypernatremia   . Hypertension   . Hypokalemia   . Hypothyroidism   . MR (mental retardation)   . Muscle weakness (generalized)   . Postural kyphosis   . Renal disorder   . Sepsis(995.91)   . Severe intellectual disability   . Severe mental retardation   . Urinary retention   . UTI (urinary tract infection)     Patient Active Problem List   Diagnosis Date Noted  . Neurogenic bladder 08/02/2016  . Sepsis (HCC) 11/02/2015  . UTI (urinary tract infection) 11/02/2015  . Sinus tachycardia 11/02/2015  . Dehydration 11/02/2015  . Leukocytosis 11/02/2015  . DM type 2 causing eye disease, not at goal [facility]) 11/02/2015  . Hypokalemia 03/04/2014  . Bacteremia due to Gram-positive bacteria 06/03/2011  . Hypernatremia 06/03/2011    . Hyperkalemia 05/29/2011  . DKA (diabetic ketoacidoses) (HCC) 05/29/2011  . Renal failure 05/29/2011  . UTI (lower urinary tract infection) 05/29/2011    Past Surgical History:  Procedure Laterality Date  . CYSTOSCOPY N/A 08/02/2016   Procedure: CYSTOSCOPY;  Surgeon: Marcine Matar, MD;  Location: [facility];  Service: Urology;  Laterality: N/A;  . INSERTION OF SUPRAPUBIC CATHETER N/A 08/02/2016   Procedure: INSERTION OF SUPRAPUBIC CATHETER;  Surgeon: Marcine Matar, MD;  Location: [facility];  Service: Urology;  Laterality: N/A;  . KIDNEY STONE SURGERY     x 1  . tooth extraction surgery  as child       Home Medications    Prior to Admission medications   Medication Sig Start Date End Date Taking? Authorizing Provider  acetaminophen (TYLENOL) 500 MG tablet Take 500 mg by mouth every 6 (six) hours as needed.   Yes [provider]  Alum & Mag Hydroxide-Simeth (ANTACID & ANTIGAS) 200-200-20 MG/5ML SUSP Take 30 mLs by mouth daily as needed (heartburn).   Yes [provider]  benzonatate (TESSALON) 100 MG capsule Take 100 mg by mouth every 8 (eight) hours as needed for cough.   Yes [provider]  fluticasone (FLONASE) 50 MCG/ACT nasal spray Place 1 spray into both nostrils 2 (two) times daily.   Yes [provider]  furosemide (LASIX) 20 MG  tablet Take 20 mg by mouth daily.   Yes [provider]  insulin aspart (NOVOLOG) 100 UNIT/ML injection Inject 2-10 Units into the skin 3 (three) times daily before meals. If 151-200 = 2 units, notify NP/MD if <60,   201-250 = 4 units, 251-300 = 6 units, 301-350 = 8 units, 351-400 = 10 units Notify MD/NP if > 400   Yes [provider]  insulin glargine (LANTUS) 100 UNIT/ML injection Inject 0.1 mLs (10 Units total) into the skin at bedtime. Patient taking differently: Inject 12 Units into the skin 2 (two) times daily.  11/11/15  Yes Standley Brooking, MD  labetalol (NORMODYNE) 300 MG tablet Take 300  mg by mouth 2 (two) times daily. Hold if systolic BP is less than 100 10/30/15  Yes [provider]  levothyroxine (SYNTHROID, LEVOTHROID) 50 MCG tablet Take 50 mcg by mouth daily before breakfast.   Yes [provider]  loratadine (CLARITIN) 10 MG tablet Take 10 mg by mouth daily.   Yes [provider]  magnesium hydroxide (MILK OF MAGNESIA) 400 MG/5ML suspension Take 30 mLs by mouth daily as needed for mild constipation.   Yes [provider]  Magnesium Sulfate, Laxative, (EPSOM SALT) GRAN by Does not apply route. Soak both feet in lukewarm epsom salt water 2-3 times per week for 10-15 minutes.   Yes [provider]  mirtazapine (REMERON) 15 MG tablet Take 15 mg by mouth daily. For weight loss.   Yes [provider]  potassium chloride SA (K-DUR,KLOR-CON) 20 MEQ tablet Take 20 mEq by mouth daily.   Yes [provider]  tamsulosin (FLOMAX) 0.4 MG CAPS capsule Take 0.4 mg by mouth daily. Hold if systolic BP is less than 100 10/30/15  Yes [provider]  tolterodine (DETROL) 1 MG tablet Take 1 mg by mouth daily as needed (bladder).   Yes [provider]    Family History History reviewed. No pertinent family history.  Social History Social History  Substance Use Topics  . Smoking status: Never Smoker  . Smokeless tobacco: Never Used  . Alcohol use No     Allergies   Patient has no known allergies.   Review of Systems Review of Systems  Unable to perform ROS: Mental status change     Physical Exam Updated Vital Signs BP (!) 133/94   Pulse 88   Temp 97.6 F (36.4 C) (Oral)   Resp 20   Ht 5\' 8"  (1.727 m)   Wt 61.2 kg (135 lb)   SpO2 97%   BMI 20.53 kg/m   Physical Exam  Constitutional: He appears well-developed.  Appears older than stated age  HENT:  Head: Normocephalic and atraumatic.  Right Ear: External ear normal.  Left Ear: External ear normal.  Eyes: Conjunctivae and EOM are normal.  Pupils are equal, round, and reactive to light.  Neck: Normal range of motion and phonation normal. Neck supple.  Cardiovascular: Normal rate.   Pulmonary/Chest: Effort normal. He exhibits no bony tenderness.  Abdominal: Soft. There is no tenderness.  Suprapubic catheter ostomy site, the left of midline, appears open, without associated bleeding, drainage or swelling.  Musculoskeletal: Normal range of motion.  Neurological: He is alert. No cranial nerve deficit or sensory deficit. He exhibits normal muscle tone. Coordination normal.  Skin: Skin is warm, dry and intact.  Psychiatric: He has a normal mood and affect. His behavior is normal.  Nursing note and vitals reviewed.    ED Treatments / Results  Labs (all labs ordered are listed, but only abnormal results are displayed) Labs Reviewed - No data to display  EKG  EKG Interpretation None       Radiology No results found.  Procedures BLADDER CATHETERIZATION Date/Time: 10/18/2016 3:39 PM Performed by: Mancel BaleWENTZ, Michail Boyte Authorized by: Mancel BaleWENTZ, Gerica Koble   Consent:    Consent obtained:  Verbal   Consent given by:  Guardian and healthcare agent   Risks discussed:  False passage, infection, incomplete procedure and pain   Alternatives discussed:  No treatment Pre-procedure details:    Procedure purpose:  Therapeutic (Replace suprapubic catheter)   Preparation: Patient was prepped and draped in usual sterile fashion   Anesthesia (see MAR for exact dosages):    Anesthesia method:  None Procedure details:    Provider performed due to:  Nurse unable to complete   Catheter insertion:  Indwelling   Catheter type:  Foley   Catheter size:  16 Fr   Bladder irrigation: no     Number of attempts:  1   Urine characteristics:  Clear Post-procedure details:    Patient tolerance of procedure:  Tolerated well, no immediate complications   (including critical care time)  Medications Ordered in ED Medications - No data to  display   Initial Impression / Assessment and Plan / ED Course  I have reviewed the triage vital signs and the nursing notes.  Pertinent labs & imaging results that were available during my care of the patient were reviewed by me and considered in my medical decision making (see chart for details).      Patient Vitals for the past 24 hrs:  BP Temp Temp src Pulse Resp SpO2 Height Weight  10/18/16 1525 - - - 88 - 97 % - -  10/18/16 1524 (!) 133/94 - - 86 - 97 % - -  10/18/16 1300 - - - - - - 5\' 8"  (1.727 m) 61.2 kg (135 lb)  10/18/16 1259 140/90 97.6 F (36.4 C) Oral (!) 101 20 99 % - -       3:40 PM Reevaluation with update and discussion. After initial assessment and treatment, an updated evaluation reveals no change in clinical status following placement of catheter.  Findings discussed with family members, all questions answered. Demarian Epps L    Final Clinical Impressions(s) / ED Diagnoses   Final diagnoses:  Chronic suprapubic catheter (HCC)  Suprapubic catheter dysfunction, initial encounter (HCC)   Replaced dislodged suprapubic catheter, catheter stay device applied to catheter hub, per usual protocol.  Nursing Notes Reviewed/ Care Coordinated Applicable Imaging Reviewed Interpretation of Laboratory Data incorporated into ED treatment  The patient appears reasonably screened and/or stabilized for discharge and I doubt any other medical condition or other [facility] requiring further screening, evaluation, or treatment in the ED at this time prior to discharge.  Plan: Home Medications-continue usual; Home Treatments-gradually advance diet; return here if the recommended treatment, does not improve the symptoms; Recommended follow up-PCP, as needed; urology as needed   New Prescriptions New Prescriptions   No medications on file     Mancel BaleWentz, Audreanna Torrisi, MD 10/18/16 1541

## 2016-10-18 NOTE — Discharge Instructions (Signed)
Follow-up with urologist if you continue to have difficulty with keeping the catheter in

## 2016-10-18 NOTE — ED Notes (Signed)
Removed 1 suture from suprapubic area as verbal order was given by Dr. Effie ShyWentz.

## 2016-10-18 NOTE — ED Notes (Signed)
1 suture removed from suprapubic entrance.

## 2016-10-18 NOTE — ED Triage Notes (Signed)
Patient from [facility], states his suprapubic catheter came out this morning. States they are unable to get it back in.

## 2016-10-18 NOTE — ED Notes (Signed)
16 fr suprapubic catheter placed by Dr. Effie ShyWentz. 500ml of urine noted in foley bag. Cath secure placed to right upper leg.

## 2016-10-18 NOTE — ED Notes (Signed)
Report given to [place] at [facility].

## 2016-10-22 DIAGNOSIS — D649 Anemia, unspecified: Secondary | ICD-10-CM | POA: Diagnosis not present

## 2016-10-22 DIAGNOSIS — R05 Cough: Secondary | ICD-10-CM | POA: Diagnosis not present

## 2016-10-22 DIAGNOSIS — E088 Diabetes mellitus due to underlying condition with unspecified complications: Secondary | ICD-10-CM | POA: Diagnosis not present

## 2016-10-22 DIAGNOSIS — I10 Essential (primary) hypertension: Secondary | ICD-10-CM | POA: Diagnosis not present

## 2016-10-29 DIAGNOSIS — R278 Other lack of coordination: Secondary | ICD-10-CM | POA: Diagnosis not present

## 2016-10-29 DIAGNOSIS — M6281 Muscle weakness (generalized): Secondary | ICD-10-CM | POA: Diagnosis not present

## 2016-10-29 DIAGNOSIS — G9340 Encephalopathy, unspecified: Secondary | ICD-10-CM | POA: Diagnosis not present

## 2016-11-01 NOTE — Addendum Note (Signed)
Addendum  created 11/01/16 1159 by Lumina Gitto, MD   Sign clinical note    

## 2016-11-05 DIAGNOSIS — L603 Nail dystrophy: Secondary | ICD-10-CM | POA: Diagnosis not present

## 2016-11-05 DIAGNOSIS — E1142 Type 2 diabetes mellitus with diabetic polyneuropathy: Secondary | ICD-10-CM | POA: Diagnosis not present

## 2016-11-25 DIAGNOSIS — R627 Adult failure to thrive: Secondary | ICD-10-CM | POA: Diagnosis not present

## 2016-11-25 DIAGNOSIS — M79643 Pain in unspecified hand: Secondary | ICD-10-CM | POA: Diagnosis not present

## 2016-11-26 DIAGNOSIS — M6281 Muscle weakness (generalized): Secondary | ICD-10-CM | POA: Diagnosis not present

## 2016-11-26 DIAGNOSIS — M19042 Primary osteoarthritis, left hand: Secondary | ICD-10-CM | POA: Diagnosis not present

## 2016-11-26 DIAGNOSIS — M2550 Pain in unspecified joint: Secondary | ICD-10-CM | POA: Diagnosis not present

## 2016-11-26 DIAGNOSIS — Z79899 Other long term (current) drug therapy: Secondary | ICD-10-CM | POA: Diagnosis not present

## 2016-11-26 DIAGNOSIS — D649 Anemia, unspecified: Secondary | ICD-10-CM | POA: Diagnosis not present

## 2016-11-26 DIAGNOSIS — R6889 Other general symptoms and signs: Secondary | ICD-10-CM | POA: Diagnosis not present

## 2016-11-26 DIAGNOSIS — R69 Illness, unspecified: Secondary | ICD-10-CM | POA: Diagnosis not present

## 2016-11-29 DIAGNOSIS — M6281 Muscle weakness (generalized): Secondary | ICD-10-CM | POA: Diagnosis not present

## 2016-11-29 DIAGNOSIS — M2550 Pain in unspecified joint: Secondary | ICD-10-CM | POA: Diagnosis not present

## 2016-11-29 DIAGNOSIS — Z79899 Other long term (current) drug therapy: Secondary | ICD-10-CM | POA: Diagnosis not present

## 2016-12-02 DIAGNOSIS — R627 Adult failure to thrive: Secondary | ICD-10-CM | POA: Diagnosis not present

## 2016-12-02 DIAGNOSIS — M79643 Pain in unspecified hand: Secondary | ICD-10-CM | POA: Diagnosis not present

## 2016-12-16 DIAGNOSIS — R627 Adult failure to thrive: Secondary | ICD-10-CM | POA: Diagnosis not present

## 2016-12-16 DIAGNOSIS — M79643 Pain in unspecified hand: Secondary | ICD-10-CM | POA: Diagnosis not present

## 2016-12-23 DIAGNOSIS — R627 Adult failure to thrive: Secondary | ICD-10-CM | POA: Diagnosis not present

## 2016-12-23 DIAGNOSIS — R8290 Unspecified abnormal findings in urine: Secondary | ICD-10-CM | POA: Diagnosis not present

## 2016-12-23 DIAGNOSIS — M79643 Pain in unspecified hand: Secondary | ICD-10-CM | POA: Diagnosis not present

## 2016-12-24 DIAGNOSIS — R319 Hematuria, unspecified: Secondary | ICD-10-CM | POA: Diagnosis not present

## 2016-12-24 DIAGNOSIS — N390 Urinary tract infection, site not specified: Secondary | ICD-10-CM | POA: Diagnosis not present

## 2016-12-24 DIAGNOSIS — Z79899 Other long term (current) drug therapy: Secondary | ICD-10-CM | POA: Diagnosis not present

## 2017-02-10 DIAGNOSIS — R319 Hematuria, unspecified: Secondary | ICD-10-CM | POA: Diagnosis not present

## 2017-02-10 DIAGNOSIS — R6889 Other general symptoms and signs: Secondary | ICD-10-CM | POA: Diagnosis not present

## 2017-02-10 DIAGNOSIS — N390 Urinary tract infection, site not specified: Secondary | ICD-10-CM | POA: Diagnosis not present

## 2017-02-10 DIAGNOSIS — R339 Retention of urine, unspecified: Secondary | ICD-10-CM | POA: Diagnosis not present

## 2017-02-16 DIAGNOSIS — N390 Urinary tract infection, site not specified: Secondary | ICD-10-CM | POA: Diagnosis not present

## 2017-02-16 DIAGNOSIS — R319 Hematuria, unspecified: Secondary | ICD-10-CM | POA: Diagnosis not present

## 2017-02-16 DIAGNOSIS — Z79899 Other long term (current) drug therapy: Secondary | ICD-10-CM | POA: Diagnosis not present

## 2017-02-18 DIAGNOSIS — R109 Unspecified abdominal pain: Secondary | ICD-10-CM | POA: Diagnosis not present

## 2017-02-22 DIAGNOSIS — A4189 Other specified sepsis: Secondary | ICD-10-CM | POA: Diagnosis not present

## 2017-02-24 DIAGNOSIS — M21242 Flexion deformity, left finger joints: Secondary | ICD-10-CM | POA: Diagnosis not present

## 2017-03-08 DIAGNOSIS — E1142 Type 2 diabetes mellitus with diabetic polyneuropathy: Secondary | ICD-10-CM | POA: Diagnosis not present

## 2017-03-08 DIAGNOSIS — L603 Nail dystrophy: Secondary | ICD-10-CM | POA: Diagnosis not present

## 2017-03-09 DIAGNOSIS — M1990 Unspecified osteoarthritis, unspecified site: Secondary | ICD-10-CM | POA: Diagnosis not present

## 2017-03-09 DIAGNOSIS — M21242 Flexion deformity, left finger joints: Secondary | ICD-10-CM | POA: Diagnosis not present

## 2017-03-11 ENCOUNTER — Ambulatory Visit (INDEPENDENT_AMBULATORY_CARE_PROVIDER_SITE_OTHER): Payer: Medicare Other | Admitting: Urology

## 2017-03-11 DIAGNOSIS — N3020 Other chronic cystitis without hematuria: Secondary | ICD-10-CM | POA: Diagnosis not present

## 2017-03-11 DIAGNOSIS — T83510A Infection and inflammatory reaction due to cystostomy catheter, initial encounter: Secondary | ICD-10-CM | POA: Diagnosis not present

## 2017-03-11 DIAGNOSIS — R338 Other retention of urine: Secondary | ICD-10-CM | POA: Diagnosis not present

## 2017-03-11 DIAGNOSIS — N312 Flaccid neuropathic bladder, not elsewhere classified: Secondary | ICD-10-CM

## 2017-03-15 DIAGNOSIS — I10 Essential (primary) hypertension: Secondary | ICD-10-CM | POA: Diagnosis not present

## 2017-03-15 DIAGNOSIS — N400 Enlarged prostate without lower urinary tract symptoms: Secondary | ICD-10-CM | POA: Diagnosis not present

## 2017-03-15 DIAGNOSIS — E039 Hypothyroidism, unspecified: Secondary | ICD-10-CM | POA: Diagnosis not present

## 2017-04-19 ENCOUNTER — Ambulatory Visit (INDEPENDENT_AMBULATORY_CARE_PROVIDER_SITE_OTHER): Payer: Medicare Other | Admitting: Urology

## 2017-04-19 DIAGNOSIS — N401 Enlarged prostate with lower urinary tract symptoms: Secondary | ICD-10-CM | POA: Diagnosis not present

## 2017-04-19 DIAGNOSIS — Z438 Encounter for attention to other artificial openings: Secondary | ICD-10-CM

## 2017-05-03 ENCOUNTER — Ambulatory Visit (INDEPENDENT_AMBULATORY_CARE_PROVIDER_SITE_OTHER): Payer: Medicare Other | Admitting: Urology

## 2017-05-03 DIAGNOSIS — N401 Enlarged prostate with lower urinary tract symptoms: Secondary | ICD-10-CM

## 2017-05-17 ENCOUNTER — Ambulatory Visit: Payer: Medicare Other | Admitting: Urology

## 2017-06-16 ENCOUNTER — Ambulatory Visit (INDEPENDENT_AMBULATORY_CARE_PROVIDER_SITE_OTHER): Payer: Medicare Other | Admitting: Otolaryngology

## 2017-06-16 DIAGNOSIS — H903 Sensorineural hearing loss, bilateral: Secondary | ICD-10-CM

## 2017-06-16 DIAGNOSIS — J310 Chronic rhinitis: Secondary | ICD-10-CM

## 2017-11-22 ENCOUNTER — Ambulatory Visit (INDEPENDENT_AMBULATORY_CARE_PROVIDER_SITE_OTHER): Payer: Medicare Other | Admitting: Urology

## 2017-11-22 DIAGNOSIS — N3020 Other chronic cystitis without hematuria: Secondary | ICD-10-CM | POA: Diagnosis not present

## 2017-11-22 DIAGNOSIS — N401 Enlarged prostate with lower urinary tract symptoms: Secondary | ICD-10-CM

## 2017-11-22 DIAGNOSIS — N312 Flaccid neuropathic bladder, not elsewhere classified: Secondary | ICD-10-CM

## 2017-11-30 ENCOUNTER — Emergency Department (HOSPITAL_COMMUNITY): Payer: Medicare Other

## 2017-11-30 ENCOUNTER — Inpatient Hospital Stay (HOSPITAL_COMMUNITY): Payer: Medicare Other

## 2017-11-30 ENCOUNTER — Encounter (HOSPITAL_COMMUNITY): Payer: Self-pay

## 2017-11-30 ENCOUNTER — Other Ambulatory Visit: Payer: Self-pay

## 2017-11-30 ENCOUNTER — Inpatient Hospital Stay (HOSPITAL_COMMUNITY)
Admission: EM | Admit: 2017-11-30 | Discharge: 2017-12-04 | DRG: 698 | Disposition: A | Discharge status: Skilled Nursing Facility | Payer: Medicare Other | Attending: Internal Medicine | Admitting: Internal Medicine

## 2017-11-30 DIAGNOSIS — M4000 Postural kyphosis, site unspecified: Secondary | ICD-10-CM | POA: Diagnosis present

## 2017-11-30 DIAGNOSIS — J189 Pneumonia, unspecified organism: Secondary | ICD-10-CM | POA: Diagnosis not present

## 2017-11-30 DIAGNOSIS — Z681 Body mass index (BMI) 19 or less, adult: Secondary | ICD-10-CM | POA: Diagnosis not present

## 2017-11-30 DIAGNOSIS — Z7189 Other specified counseling: Secondary | ICD-10-CM | POA: Diagnosis not present

## 2017-11-30 DIAGNOSIS — Z9359 Other cystostomy status: Secondary | ICD-10-CM | POA: Diagnosis not present

## 2017-11-30 DIAGNOSIS — M1990 Unspecified osteoarthritis, unspecified site: Secondary | ICD-10-CM | POA: Diagnosis present

## 2017-11-30 DIAGNOSIS — G9340 Encephalopathy, unspecified: Secondary | ICD-10-CM | POA: Diagnosis not present

## 2017-11-30 DIAGNOSIS — N319 Neuromuscular dysfunction of bladder, unspecified: Secondary | ICD-10-CM | POA: Diagnosis present

## 2017-11-30 DIAGNOSIS — Y738 Miscellaneous gastroenterology and urology devices associated with adverse incidents, not elsewhere classified: Secondary | ICD-10-CM | POA: Diagnosis present

## 2017-11-30 DIAGNOSIS — E46 Unspecified protein-calorie malnutrition: Secondary | ICD-10-CM

## 2017-11-30 DIAGNOSIS — Z66 Do not resuscitate: Secondary | ICD-10-CM | POA: Diagnosis not present

## 2017-11-30 DIAGNOSIS — N390 Urinary tract infection, site not specified: Secondary | ICD-10-CM | POA: Diagnosis present

## 2017-11-30 DIAGNOSIS — E039 Hypothyroidism, unspecified: Secondary | ICD-10-CM | POA: Diagnosis present

## 2017-11-30 DIAGNOSIS — L89152 Pressure ulcer of sacral region, stage 2: Secondary | ICD-10-CM | POA: Diagnosis present

## 2017-11-30 DIAGNOSIS — R1310 Dysphagia, unspecified: Secondary | ICD-10-CM

## 2017-11-30 DIAGNOSIS — E1165 Type 2 diabetes mellitus with hyperglycemia: Secondary | ICD-10-CM | POA: Diagnosis present

## 2017-11-30 DIAGNOSIS — Z794 Long term (current) use of insulin: Secondary | ICD-10-CM | POA: Diagnosis not present

## 2017-11-30 DIAGNOSIS — N400 Enlarged prostate without lower urinary tract symptoms: Secondary | ICD-10-CM | POA: Diagnosis present

## 2017-11-30 DIAGNOSIS — B964 Proteus (mirabilis) (morganii) as the cause of diseases classified elsewhere: Secondary | ICD-10-CM | POA: Diagnosis present

## 2017-11-30 DIAGNOSIS — R532 Functional quadriplegia: Secondary | ICD-10-CM | POA: Diagnosis not present

## 2017-11-30 DIAGNOSIS — R8281 Pyuria: Secondary | ICD-10-CM

## 2017-11-30 DIAGNOSIS — Z79890 Hormone replacement therapy (postmenopausal): Secondary | ICD-10-CM

## 2017-11-30 DIAGNOSIS — T83510A Infection and inflammatory reaction due to cystostomy catheter, initial encounter: Principal | ICD-10-CM | POA: Diagnosis present

## 2017-11-30 DIAGNOSIS — Z515 Encounter for palliative care: Secondary | ICD-10-CM | POA: Diagnosis not present

## 2017-11-30 DIAGNOSIS — I110 Hypertensive heart disease with heart failure: Secondary | ICD-10-CM | POA: Diagnosis present

## 2017-11-30 DIAGNOSIS — E860 Dehydration: Secondary | ICD-10-CM | POA: Diagnosis present

## 2017-11-30 DIAGNOSIS — Z7951 Long term (current) use of inhaled steroids: Secondary | ICD-10-CM

## 2017-11-30 DIAGNOSIS — Z7401 Bed confinement status: Secondary | ICD-10-CM

## 2017-11-30 DIAGNOSIS — R41841 Cognitive communication deficit: Secondary | ICD-10-CM | POA: Diagnosis present

## 2017-11-30 DIAGNOSIS — I509 Heart failure, unspecified: Secondary | ICD-10-CM | POA: Diagnosis present

## 2017-11-30 DIAGNOSIS — L8990 Pressure ulcer of unspecified site, unspecified stage: Secondary | ICD-10-CM

## 2017-11-30 LAB — CBC WITH DIFFERENTIAL/PLATELET
BASOS PCT: 0 %
Basophils Absolute: 0 10*3/uL (ref 0.0–0.1)
Eosinophils Absolute: 0.1 10*3/uL (ref 0.0–0.7)
Eosinophils Relative: 1 %
HEMATOCRIT: 35.7 % — AB (ref 39.0–52.0)
HEMOGLOBIN: 11.1 g/dL — AB (ref 13.0–17.0)
LYMPHS ABS: 1.1 10*3/uL (ref 0.7–4.0)
Lymphocytes Relative: 11 %
MCH: 29.6 pg (ref 26.0–34.0)
MCHC: 31.1 g/dL (ref 30.0–36.0)
MCV: 95.2 fL (ref 78.0–100.0)
MONO ABS: 0.8 10*3/uL (ref 0.1–1.0)
MONOS PCT: 8 %
NEUTROS ABS: 8.6 10*3/uL — AB (ref 1.7–7.7)
Neutrophils Relative %: 80 %
Platelets: 273 10*3/uL (ref 150–400)
RBC: 3.75 MIL/uL — ABNORMAL LOW (ref 4.22–5.81)
RDW: 12.8 % (ref 11.5–15.5)
WBC: 10.7 10*3/uL — ABNORMAL HIGH (ref 4.0–10.5)

## 2017-11-30 LAB — COMPREHENSIVE METABOLIC PANEL
ALBUMIN: 2.6 g/dL — AB (ref 3.5–5.0)
ALK PHOS: 82 U/L (ref 38–126)
ALT: 15 U/L (ref 0–44)
ANION GAP: 9 (ref 5–15)
AST: 27 U/L (ref 15–41)
BUN: 26 mg/dL — ABNORMAL HIGH (ref 8–23)
CALCIUM: 8.6 mg/dL — AB (ref 8.9–10.3)
CHLORIDE: 105 mmol/L (ref 98–111)
CO2: 25 mmol/L (ref 22–32)
Creatinine, Ser: 1.18 mg/dL (ref 0.61–1.24)
GFR calc Af Amer: >60 mL/min (ref >60)
GFR calc non Af Amer: >60 mL/min (ref >60)
GLUCOSE: 185 mg/dL — AB (ref 70–99)
Potassium: 4.2 mmol/L (ref 3.5–5.1)
SODIUM: 139 mmol/L (ref 135–145)
Total Bilirubin: 0.6 mg/dL (ref 0.3–1.2)
Total Protein: 7.1 g/dL (ref 6.5–8.1)

## 2017-11-30 LAB — URINALYSIS, ROUTINE W REFLEX MICROSCOPIC
BILIRUBIN URINE: NEGATIVE
Glucose, UA: NEGATIVE mg/dL
KETONES UR: NEGATIVE mg/dL
Nitrite: NEGATIVE
Protein, ur: 100 mg/dL — AB
Specific Gravity, Urine: 1.013 (ref 1.005–1.030)
pH: 8 (ref 5.0–8.0)

## 2017-11-30 LAB — GLUCOSE, CAPILLARY
GLUCOSE-CAPILLARY: 146 mg/dL — AB (ref 70–99)
Glucose-Capillary: 200 mg/dL — ABNORMAL HIGH (ref 70–99)

## 2017-11-30 LAB — PROCALCITONIN: PROCALCITONIN: 0.94 ng/mL

## 2017-11-30 LAB — CBG MONITORING, ED: Glucose-Capillary: 172 mg/dL — ABNORMAL HIGH (ref 70–99)

## 2017-11-30 LAB — I-STAT CG4 LACTIC ACID, ED: Lactic Acid, Venous: 1.57 mmol/L (ref 0.5–1.9)

## 2017-11-30 LAB — MRSA PCR SCREENING: MRSA by PCR: POSITIVE — AB

## 2017-11-30 LAB — TROPONIN I
TROPONIN I: 0.03 ng/mL — AB (ref <0.03)
Troponin I: <0.03 ng/mL (ref <0.03)
Troponin I: 0.03 ng/mL (ref <0.03)

## 2017-11-30 LAB — TSH: TSH: 2.091 u[IU]/mL (ref 0.350–4.500)

## 2017-11-30 MED ORDER — PIPERACILLIN-TAZOBACTAM 3.375 G IVPB 30 MIN
3.3750 g | Freq: Once | INTRAVENOUS | Status: AC
  Administered 2017-11-30: 3.375 g via INTRAVENOUS
  Filled 2017-11-30: qty 50

## 2017-11-30 MED ORDER — SODIUM CHLORIDE 0.9 % IV SOLN
1.0000 g | Freq: Once | INTRAVENOUS | Status: AC
  Administered 2017-11-30: 1 g via INTRAVENOUS
  Filled 2017-11-30: qty 1

## 2017-11-30 MED ORDER — FLUTICASONE PROPIONATE 50 MCG/ACT NA SUSP
1.0000 | Freq: Two times a day (BID) | NASAL | Status: DC
  Administered 2017-11-30 – 2017-12-04 (×8): 1 via NASAL
  Filled 2017-11-30: qty 32
  Filled 2017-11-30: qty 16

## 2017-11-30 MED ORDER — TAMSULOSIN HCL 0.4 MG PO CAPS
0.4000 mg | ORAL_CAPSULE | Freq: Every day | ORAL | Status: DC
  Administered 2017-12-01 – 2017-12-03 (×2): 0.4 mg via ORAL
  Filled 2017-11-30 (×2): qty 1

## 2017-11-30 MED ORDER — BENZONATATE 100 MG PO CAPS: 100.0000 mg | ORAL_CAPSULE | Freq: Three times a day (TID) | ORAL | Status: DC | PRN

## 2017-11-30 MED ORDER — OXYBUTYNIN CHLORIDE ER 5 MG PO TB24
5.0000 mg | ORAL_TABLET | Freq: Every day | ORAL | Status: DC
  Administered 2017-12-01 – 2017-12-03 (×3): 5 mg via ORAL
  Filled 2017-11-30 (×4): qty 1

## 2017-11-30 MED ORDER — LACTATED RINGERS IV SOLN
INTRAVENOUS | Status: AC
  Administered 2017-11-30: 18:00:00 via INTRAVENOUS

## 2017-11-30 MED ORDER — ACETAMINOPHEN 325 MG PO TABS
650.0000 mg | ORAL_TABLET | Freq: Four times a day (QID) | ORAL | Status: DC | PRN
  Administered 2017-12-03: 650 mg via ORAL
  Filled 2017-11-30: qty 2

## 2017-11-30 MED ORDER — ONDANSETRON HCL 4 MG PO TABS: 4.0000 mg | ORAL_TABLET | Freq: Four times a day (QID) | ORAL | Status: DC | PRN

## 2017-11-30 MED ORDER — SODIUM CHLORIDE 0.9 % IV SOLN
1.0000 g | Freq: Once | INTRAVENOUS | Status: AC
  Administered 2017-11-30: 1 g via INTRAVENOUS
  Filled 2017-11-30: qty 10

## 2017-11-30 MED ORDER — VANCOMYCIN HCL 500 MG IV SOLR
INTRAVENOUS | Status: AC
  Filled 2017-11-30: qty 500

## 2017-11-30 MED ORDER — PIPERACILLIN-TAZOBACTAM 3.375 G IVPB
3.3750 g | Freq: Three times a day (TID) | INTRAVENOUS | Status: DC
  Administered 2017-12-01 – 2017-12-03 (×9): 3.375 g via INTRAVENOUS
  Filled 2017-11-30 (×11): qty 50

## 2017-11-30 MED ORDER — INSULIN ASPART 100 UNIT/ML ~~LOC~~ SOLN
0.0000 [IU] | Freq: Every day | SUBCUTANEOUS | Status: DC
  Administered 2017-12-01 – 2017-12-03 (×2): 2 [IU] via SUBCUTANEOUS

## 2017-11-30 MED ORDER — MIRTAZAPINE 15 MG PO TABS
15.0000 mg | ORAL_TABLET | Freq: Every day | ORAL | Status: DC
  Administered 2017-12-01 – 2017-12-04 (×4): 15 mg via ORAL
  Filled 2017-11-30 (×4): qty 1

## 2017-11-30 MED ORDER — SODIUM CHLORIDE 0.9 % IV BOLUS
1000.0000 mL | Freq: Once | INTRAVENOUS | Status: AC
  Administered 2017-11-30: 1000 mL via INTRAVENOUS

## 2017-11-30 MED ORDER — LEVOTHYROXINE SODIUM 50 MCG PO TABS
50.0000 ug | ORAL_TABLET | Freq: Every day | ORAL | Status: DC
  Administered 2017-12-02 – 2017-12-04 (×3): 50 ug via ORAL
  Filled 2017-11-30 (×4): qty 1

## 2017-11-30 MED ORDER — INSULIN ASPART 100 UNIT/ML ~~LOC~~ SOLN
0.0000 [IU] | Freq: Three times a day (TID) | SUBCUTANEOUS | Status: DC
  Administered 2017-12-01 – 2017-12-02 (×2): 2 [IU] via SUBCUTANEOUS
  Administered 2017-12-02: 3 [IU] via SUBCUTANEOUS
  Administered 2017-12-02: 2 [IU] via SUBCUTANEOUS
  Administered 2017-12-03: 3 [IU] via SUBCUTANEOUS
  Administered 2017-12-03 (×2): 1 [IU] via SUBCUTANEOUS
  Administered 2017-12-04: 2 [IU] via SUBCUTANEOUS
  Administered 2017-12-04: 3 [IU] via SUBCUTANEOUS

## 2017-11-30 MED ORDER — CHLORHEXIDINE GLUCONATE CLOTH 2 % EX PADS
6.0000 | MEDICATED_PAD | Freq: Every day | CUTANEOUS | Status: DC
  Administered 2017-12-01 – 2017-12-04 (×4): 6 via TOPICAL

## 2017-11-30 MED ORDER — INSULIN GLARGINE 100 UNIT/ML ~~LOC~~ SOLN
5.0000 [IU] | Freq: Every day | SUBCUTANEOUS | Status: DC
  Administered 2017-11-30 – 2017-12-03 (×4): 5 [IU] via SUBCUTANEOUS
  Filled 2017-11-30 (×5): qty 0.05

## 2017-11-30 MED ORDER — ONDANSETRON HCL 4 MG/2ML IJ SOLN: 4.0000 mg | Freq: Four times a day (QID) | INTRAMUSCULAR | Status: DC | PRN

## 2017-11-30 MED ORDER — AZITHROMYCIN 500 MG IV SOLR
500.0000 mg | INTRAVENOUS | Status: DC
  Administered 2017-11-30: 500 mg via INTRAVENOUS
  Filled 2017-11-30: qty 500

## 2017-11-30 MED ORDER — ENOXAPARIN SODIUM 40 MG/0.4ML ~~LOC~~ SOLN
40.0000 mg | SUBCUTANEOUS | Status: DC
  Administered 2017-11-30 – 2017-12-03 (×4): 40 mg via SUBCUTANEOUS
  Filled 2017-11-30 (×4): qty 0.4

## 2017-11-30 MED ORDER — VANCOMYCIN HCL 500 MG IV SOLR
500.0000 mg | Freq: Two times a day (BID) | INTRAVENOUS | Status: DC
  Administered 2017-12-01: 500 mg via INTRAVENOUS
  Filled 2017-11-30 (×4): qty 500

## 2017-11-30 MED ORDER — VANCOMYCIN HCL IN DEXTROSE 1-5 GM/200ML-% IV SOLN
1000.0000 mg | Freq: Once | INTRAVENOUS | Status: AC
  Administered 2017-11-30: 1000 mg via INTRAVENOUS
  Filled 2017-11-30: qty 200

## 2017-11-30 MED ORDER — MUPIROCIN 2 % EX OINT
1.0000 "application " | TOPICAL_OINTMENT | Freq: Two times a day (BID) | CUTANEOUS | Status: DC
  Administered 2017-12-01 – 2017-12-04 (×7): 1 via NASAL
  Filled 2017-11-30 (×2): qty 22

## 2017-11-30 MED ORDER — ACETAMINOPHEN 650 MG RE SUPP
650.0000 mg | Freq: Four times a day (QID) | RECTAL | Status: DC | PRN
  Administered 2017-12-02: 650 mg via RECTAL
  Filled 2017-11-30: qty 1

## 2017-11-30 NOTE — Progress Notes (Signed)
ANTIBIOTIC CONSULT NOTE - INITIAL  Pharmacy Consult for vancomycin and Zosyn  Indication:  Catheter-associated UTI  No Known Allergies  Patient Measurements:   Adjusted Body Weight: no height or weight available at this time   Vital Signs: BP: 114/77 (07/03 1430) Pulse Rate: 81 (07/03 1430) Intake/Output from previous day: No intake/output data recorded. Intake/Output from this shift: No intake/output data recorded.  Labs: Recent Labs    11/30/17 1154  WBC 10.7*  HGB 11.1*  PLT 273  CREATININE 1.18   CrCl cannot be calculated (Unknown ideal weight.). No results for input(s): VANCOTROUGH, VANCOPEAK, VANCORANDOM, GENTTROUGH, GENTPEAK, GENTRANDOM, TOBRATROUGH, TOBRAPEAK, TOBRARND, AMIKACINPEAK, AMIKACINTROU, AMIKACIN in the last 72 hours.   Microbiology: Recent Results (from the past 720 hour(s))  Culture, blood (routine x 2)     Status: None (Preliminary result)   Collection Time: 11/30/17 12:14 PM  Result Value Ref Range Status   Specimen Description BLOOD RIGHT ARM  Final   Special Requests   Final    BOTTLES DRAWN AEROBIC AND ANAEROBIC Blood Culture adequate volume Performed at [facility], [address] [place], [place] [address]    Culture PENDING  Incomplete   Report Status PENDING  Incomplete  Culture, blood (routine x 2)     Status: None (Preliminary result)   Collection Time: 11/30/17 12:14 PM  Result Value Ref Range Status   Specimen Description BLOOD RIGHT HAND  Final   Special Requests   Final    BOTTLES DRAWN AEROBIC AND ANAEROBIC Blood Culture adequate volume Performed at [facility], [address] [place], [place] [address]    Culture PENDING  Incomplete   Report Status PENDING  Incomplete    Medical History: Past Medical History:  Diagnosis Date  . Arthritis   . Bladder mass   . BPH (benign prostatic hyperplasia)   . CHF (congestive heart failure) (HCC)   . Chronic cough   . Cognitive communication deficit   . Complication of  anesthesia    agitaton after anesthesia  . Diabetes mellitus    type2  . DKA (diabetic ketoacidoses) (HCC)   . Dysphagia   . Encephalopathy   . Family history of adverse reaction to anesthesia    sister hard to wake up  . Gait abnormality   . Gram-positive bacteremia   . History of recurrent UTIs   . Hypernatremia   . Hypertension   . Hypokalemia   . Hypothyroidism   . MR (mental retardation)   . Muscle weakness (generalized)   . Postural kyphosis   . Renal disorder   . Sepsis(995.91)   . Severe intellectual disability   . Severe mental retardation   . Urinary retention   . UTI (urinary tract infection)     Assessment: [age] yom admitted with acute encephalopathy and catheter-associated UTI.   Goal of Therapy:  Vancomycin trough level 15-20 mcg/ml  Plan:  Vancomycin 1g IV x1 dose now (weight not available at this time) Zosyn 3.375g IV x1 dose now over 30 min Measure antibiotic drug levels at steady state Follow up culture results   Thank you for allowing pharmacy to participate in this patient's care.  Tama Highamara Soumya Colson, Pharm. D. Clinical Pharmacist 11/30/2017 4:24 PM

## 2017-11-30 NOTE — ED Provider Notes (Signed)
[facility] EMERGENCY DEPARTMENT Provider Note   CSN: [id number] Arrival date & time: 11/30/17  1140     History   Chief Complaint Chief Complaint  Patient presents with  . Weakness    HPI [name] is a 62 y.o. male.  Pt presents to the ED today with decreased po intake for 1 week.  Pt resides at [facility] in [place].  They gave him 1 L NS which did not help.  The pt has a degenerative neuromuscular d/o and he is bed bound and is MR.  The pt has a chronic suprapubic catheter as well (last changed a week ago).  Family said pt finished abx for uti about a week ago (?name).  After it was changed, they noticed some pus coming out of bladder into catheter.  It has not gotten any better.  Family also said pt has not been eating because he has now become a functional quad.  He used to be able to feed himself with his right hand, but no longer can do this.  He can eat if someone spoon feeds him.     Past Medical History:  Diagnosis Date  . Arthritis   . Bladder mass   . BPH (benign prostatic hyperplasia)   . CHF (congestive heart failure) (HCC)   . Chronic cough   . Cognitive communication deficit   . Complication of anesthesia    agitaton after anesthesia  . Diabetes mellitus    type2  . DKA (diabetic ketoacidoses) (HCC)   . Dysphagia   . Encephalopathy   . Family history of adverse reaction to anesthesia    sister hard to wake up  . Gait abnormality   . Gram-positive bacteremia   . History of recurrent UTIs   . Hypernatremia   . Hypertension   . Hypokalemia   . Hypothyroidism   . MR (mental retardation)   . Muscle weakness (generalized)   . Postural kyphosis   . Renal disorder   . Sepsis(995.91)   . Severe intellectual disability   . Severe mental retardation   . Urinary retention   . UTI (urinary tract infection)     Patient Active Problem List   Diagnosis Date Noted  . Neurogenic bladder 08/02/2016  . Sepsis (HCC) 11/02/2015  . UTI (urinary tract  infection) 11/02/2015  . Sinus tachycardia 11/02/2015  . Dehydration 11/02/2015  . Leukocytosis 11/02/2015  . DM type 2 causing eye disease, not at goal [facility]) 11/02/2015  . Hypokalemia 03/04/2014  . Bacteremia due to Gram-positive bacteria 06/03/2011  . Hypernatremia 06/03/2011  . Hyperkalemia 05/29/2011  . DKA (diabetic ketoacidoses) (HCC) 05/29/2011  . Renal failure 05/29/2011  . UTI (lower urinary tract infection) 05/29/2011    Past Surgical History:  Procedure Laterality Date  . CYSTOSCOPY N/A 08/02/2016   Procedure: CYSTOSCOPY;  Surgeon: Marcine Matar, MD;  Location: [facility];  Service: Urology;  Laterality: N/A;  . INSERTION OF SUPRAPUBIC CATHETER N/A 08/02/2016   Procedure: INSERTION OF SUPRAPUBIC CATHETER;  Surgeon: Marcine Matar, MD;  Location: [facility];  Service: Urology;  Laterality: N/A;  . KIDNEY STONE SURGERY     x 1  . tooth extraction surgery  as child        Home Medications    Prior to Admission medications   Medication Sig Start Date End Date Taking? Authorizing Provider  acetaminophen (TYLENOL) 500 MG tablet Take 500 mg by mouth every 6 (six) hours as needed.    [provider]  Alum &  Mag Hydroxide-Simeth (ANTACID & ANTIGAS) 200-200-20 MG/5ML SUSP Take 30 mLs by mouth daily as needed (heartburn).    [provider]  benzonatate (TESSALON) 100 MG capsule Take 100 mg by mouth every 8 (eight) hours as needed for cough.    [provider]  fluticasone (FLONASE) 50 MCG/ACT nasal spray Place 1 spray into both nostrils 2 (two) times daily.    [provider]  furosemide (LASIX) 20 MG tablet Take 20 mg by mouth daily.    [provider]  insulin aspart (NOVOLOG) 100 UNIT/ML injection Inject 2-10 Units into the skin 3 (three) times daily before meals. If 151-200 = 2 units, notify NP/MD if <60,   201-250 = 4 units, 251-300 = 6 units, 301-350 = 8 units, 351-400 = 10 units Notify MD/NP if > 400    [provider]    insulin glargine (LANTUS) 100 UNIT/ML injection Inject 0.1 mLs (10 Units total) into the skin at bedtime. Patient taking differently: Inject 12 Units into the skin 2 (two) times daily.  11/11/15   Standley Brooking, MD  labetalol (NORMODYNE) 300 MG tablet Take 300 mg by mouth 2 (two) times daily. Hold if systolic BP is less than 100 10/30/15   [provider]  levothyroxine (SYNTHROID, LEVOTHROID) 50 MCG tablet Take 50 mcg by mouth daily before breakfast.    [provider]  loratadine (CLARITIN) 10 MG tablet Take 10 mg by mouth daily.    [provider]  magnesium hydroxide (MILK OF MAGNESIA) 400 MG/5ML suspension Take 30 mLs by mouth daily as needed for mild constipation.    [provider]  Magnesium Sulfate, Laxative, (EPSOM SALT) GRAN by Does not apply route. Soak both feet in lukewarm epsom salt water 2-3 times per week for 10-15 minutes.    [provider]  mirtazapine (REMERON) 15 MG tablet Take 15 mg by mouth daily. For weight loss.    [provider]  potassium chloride SA (K-DUR,KLOR-CON) 20 MEQ tablet Take 20 mEq by mouth daily.    [provider]  tamsulosin (FLOMAX) 0.4 MG CAPS capsule Take 0.4 mg by mouth daily. Hold if systolic BP is less than 100 10/30/15   [provider]  tolterodine (DETROL) 1 MG tablet Take 1 mg by mouth daily as needed (bladder).    [provider]    Family History No family history on file.  Social History Social History   Tobacco Use  . Smoking status: Never Smoker  . Smokeless tobacco: Never Used  Substance Use Topics  . Alcohol use: No  . Drug use: No     Allergies   Patient has no known allergies.   Review of Systems Review of Systems  Constitutional: Positive for appetite change.  Neurological: Positive for weakness.  All other systems reviewed and are negative.    Physical Exam Updated Vital Signs BP 114/77   Pulse 81   Resp 15   SpO2 93%    Physical Exam  Constitutional: He appears well-developed and well-nourished.  HENT:  Head: Normocephalic and atraumatic.  Right Ear: External ear normal.  Left Ear: External ear normal.  Nose: Nose normal.  Mouth/Throat: Mucous membranes are dry.  Poor dentition.  MM dry  Eyes: Pupils are equal, round, and reactive to light. Conjunctivae and EOM are normal.  Neck: Normal range of motion. Neck supple.  Cardiovascular: Normal rate, regular rhythm, normal heart sounds and intact distal pulses.  Pulmonary/Chest: Effort normal and breath sounds normal.  Abdominal: Soft. Bowel sounds are normal.  Suprapubic catheter in place (urine cloudy)  Musculoskeletal:  Contractures in all 4 extremities  Neurological: He is alert.  Pt knows his name.  Skin: Skin is warm. Capillary refill takes less than 2 seconds.  Psychiatric: He has a normal mood and affect.  Nursing note and vitals reviewed.    ED Treatments / Results  Labs (all labs ordered are listed, but only abnormal results are displayed) Labs Reviewed  COMPREHENSIVE METABOLIC PANEL - Abnormal; Notable for the following components:      Result Value   Glucose, Bld 185 (*)    BUN 26 (*)    Calcium 8.6 (*)    Albumin 2.6 (*)    All other components within normal limits  CBC WITH DIFFERENTIAL/PLATELET - Abnormal; Notable for the following components:   WBC 10.7 (*)    RBC 3.75 (*)    Hemoglobin 11.1 (*)    HCT 35.7 (*)    Neutro Abs 8.6 (*)    All other components within normal limits  URINALYSIS, ROUTINE W REFLEX MICROSCOPIC - Abnormal; Notable for the following components:   APPearance TURBID (*)    Hgb urine dipstick SMALL (*)    Protein, ur 100 (*)    Leukocytes, UA MODERATE (*)    WBC, UA >50 (*)    Bacteria, UA MANY (*)    Non Squamous Epithelial 0-5 (*)    All other components within normal limits  TROPONIN I - Abnormal; Notable for the following components:   Troponin I 0.03 (*)    All other components within  normal limits  CBG MONITORING, ED - Abnormal; Notable for the following components:   Glucose-Capillary 172 (*)    All other components within normal limits  CULTURE, BLOOD (ROUTINE X 2)  CULTURE, BLOOD (ROUTINE X 2)  URINE CULTURE  I-STAT CG4 LACTIC ACID, ED    EKG EKG Interpretation  Date/Time:  Wednesday November 30 2017 11:47:34 EDT Ventricular Rate:  79 PR Interval:    QRS Duration: 125 QT Interval:  453 QTC Calculation: 520 R Axis:   66 Text Interpretation:  Sinus rhythm Nonspecific intraventricular conduction delay Borderline repolarization abnormality No significant change since last tracing Confirmed by Jacalyn LefevreHaviland, Erminia Mcnew [id number]) on 11/30/2017 12:25:02 PM   Radiology Dg Chest Portable 1 View  Result Date: 11/30/2017 CLINICAL DATA:  Weakness, cough diabetes.  Nonsmoker. EXAM: PORTABLE CHEST 1 VIEW COMPARISON:  None. FINDINGS: Normal cardiomediastinal silhouette. BILATERAL pulmonary opacities are increased, suspect mild vascular congestion; borderline pulmonary edema not excluded, versus viral pneumonitis. No definite consolidation, effusion, or pneumothorax. Bones unremarkable. IMPRESSION: Worsening aeration. BILATERAL pulmonary opacities, could represent early pulmonary edema or viral pneumonitis. Electronically Signed   By: Elsie StainJohn T Curnes M.D.   On: 11/30/2017 12:29    Procedures Procedures (including critical care time)  Medications Ordered in ED Medications  azithromycin (ZITHROMAX) 500 mg in sodium chloride 0.9 % 250 mL IVPB (500 mg Intravenous New Bag/Given 11/30/17 1429)  aztreonam (AZACTAM) 1 g in sodium chloride 0.9 % 100 mL IVPB (has no administration in time range)  sodium chloride 0.9 % bolus 1,000 mL (0 mLs Intravenous Stopped 11/30/17 1429)  cefTRIAXone (ROCEPHIN) 1 g in sodium chloride 0.9 % 100 mL IVPB (0 g Intravenous Stopped 11/30/17 1429)     Initial Impression / Assessment and Plan / ED Course  I have reviewed the triage vital signs and the nursing  notes.  Pertinent labs & imaging results that were available during my care  of the patient were reviewed by me and considered in my medical decision making (see chart for details).  Clinical Course as of Nov 30 1448  Wed Nov 30, 2017  1233 AST: 27 [JH]    Clinical Course User Index [JH] Jacalyn Lefevre, MD   Pt given IVFs and IV abx for pna and for indwelling catheter.  Pt d/w Dr. Sherryll Burger (triad) for admission.  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

## 2017-11-30 NOTE — ED Notes (Signed)
CRITICAL VALUE ALERT  Critical Value:  Trop 0.03  Date & Time Notied:  11/30/17, 1230  Provider Notified: Dr. Particia NearingHaviland  Orders Received/Actions taken: No new orders at this time

## 2017-11-30 NOTE — Progress Notes (Signed)
Pharmacy Antibiotic Note  [name] is a 62 y.o. male admitted on 11/30/2017 with lower urinary tract infection, possibly catheter associated.Pharmacy has been consulted for vancomycin and Zosyn  dosing.  Plan: Start Zosyn [id number] IV q8h (4-hr inf) Give vancomycin 1g IV x1 dose, then start  vancomycin 500mg  IV q12h Goal vancomycin trough range: 15-20 mcg/mL Pharmacy will continue to monitor renal function, vancomycin levels, cultures and patient progress.   Intake/Output Summary (Last 24 hours) at 11/30/2017 2041 Last data filed at 11/30/2017 1800 Gross per 24 hour  Intake 150 ml  Output -  Net 150 ml    Height: 5\' 9"  (175.3 cm) Weight: 134 lb 11.2 oz (61.1 kg) IBW/kg (Calculated) : 70.7  Temp (24hrs), Avg:99.2 F (37.3 C), Min:99 F (37.2 C), Max:99.4 F (37.4 C)  Recent Labs  Lab 11/30/17 1154 11/30/17 1208  WBC 10.7*  --   CREATININE 1.18  --   LATICACIDVEN  --  1.57    Estimated Creatinine Clearance: 56.8 mL/min (by C-G formula based on SCr of 1.18 mg/dL).    No Known Allergies  Antimicrobials this admission: 7/3 vancomcyin  >>  7/3 Zosyn>>  7/3 aztreonam>>7/3 7/3 ceftriaxone >>7/3 7/3 azithromycin >>7/3  Dose adjustments this admission: N/a  Microbiology results: 7/3 BCx2: in progress 7/3 UCx: in progress 7/3 MRSA PCR: in progress 7/3 Resp PCR: in progress  Thank you for allowing pharmacy to be a part of this patient's care.  Tama Highamara Solimar Maiden, Pharm. D. Clinical Pharmacist 11/30/2017 8:41 PM

## 2017-11-30 NOTE — H&P (Signed)
History and Physical    [name] ZOX:[id number] DOB: 13-May-1956 DOA: 11/30/2017  PCP: Bernerd Limbo, MD   Patient coming from: [facility]  Chief Complaint: Weakness/confusion with poor PO intake  HPI: [name] is a 62 y.o. male with medical history significant for neuromuscular degenerative disorder with bedbound status, mental retardation, chronic suprapubic catheter secondary to neurogenic bladder, type 2 diabetes, hypothyroidism, chronic cough with aspiration hypertension, and hypothyroidism who was recently been treated for catheter associated urinary tract infection with exchange of his suprapubic catheter by urology approximately 1 week ago.  He apparently has finished his course of antibiotics, but family members cannot recall the name of the antibiotic he was on.  They seem to believe that he had an E. coli infection, but he continues to have ongoing drainage of pus from the catheter site with some cloudy looking urine.  Apparently, patient has become more confused and weak with decreased oral intake over the last 1 week.  They state that he can eat if he has been fed, but his arms have grown so weak that he has now a functional quadriplegic.   ED Course: Vital signs are stable with some mild leukocytosis noted.  Troponin 0.03.  Glucose 185.  Lactic acid 1.57.  Chest x-ray with bilateral pulmonary opacities suggestive of edema versus viral pneumonitis.  Patient has been started on Rocephin, aztreonam, and azithromycin.  He cannot give any history.  Review of Systems: Cannot be obtained due to patient condition.  Past Medical History:  Diagnosis Date  . Arthritis   . Bladder mass   . BPH (benign prostatic hyperplasia)   . CHF (congestive heart failure) (HCC)   . Chronic cough   . Cognitive communication deficit   . Complication of anesthesia    agitaton after anesthesia  . Diabetes mellitus    type2  . DKA (diabetic ketoacidoses) (HCC)   . Dysphagia   .  Encephalopathy   . Family history of adverse reaction to anesthesia    sister hard to wake up  . Gait abnormality   . Gram-positive bacteremia   . History of recurrent UTIs   . Hypernatremia   . Hypertension   . Hypokalemia   . Hypothyroidism   . MR (mental retardation)   . Muscle weakness (generalized)   . Postural kyphosis   . Renal disorder   . Sepsis(995.91)   . Severe intellectual disability   . Severe mental retardation   . Urinary retention   . UTI (urinary tract infection)     Past Surgical History:  Procedure Laterality Date  . CYSTOSCOPY N/A 08/02/2016   Procedure: CYSTOSCOPY;  Surgeon: Marcine Matar, MD;  Location: [facility];  Service: Urology;  Laterality: N/A;  . INSERTION OF SUPRAPUBIC CATHETER N/A 08/02/2016   Procedure: INSERTION OF SUPRAPUBIC CATHETER;  Surgeon: Marcine Matar, MD;  Location: [facility];  Service: Urology;  Laterality: N/A;  . KIDNEY STONE SURGERY     x 1  . tooth extraction surgery  as child     reports that he has never smoked. He has never used smokeless tobacco. He reports that he does not drink alcohol or use drugs.  No Known Allergies  No family history on file.  Prior to Admission medications   Medication Sig Start Date End Date Taking? Authorizing Provider  acetaminophen (TYLENOL) 500 MG tablet Take 500 mg by mouth every 6 (six) hours as needed.    [provider]  Alum & Mag Hydroxide-Simeth (ANTACID &  ANTIGAS) 200-200-20 MG/5ML SUSP Take 30 mLs by mouth daily as needed (heartburn).    [provider]  benzonatate (TESSALON) 100 MG capsule Take 100 mg by mouth every 8 (eight) hours as needed for cough.    [provider]  fluticasone (FLONASE) 50 MCG/ACT nasal spray Place 1 spray into both nostrils 2 (two) times daily.    [provider]  furosemide (LASIX) 20 MG tablet Take 20 mg by mouth daily.    [provider]  insulin aspart (NOVOLOG) 100 UNIT/ML injection Inject 2-10 Units into the  skin 3 (three) times daily before meals. If 151-200 = 2 units, notify NP/MD if <60,   201-250 = 4 units, 251-300 = 6 units, 301-350 = 8 units, 351-400 = 10 units Notify MD/NP if > 400    [provider]  insulin glargine (LANTUS) 100 UNIT/ML injection Inject 0.1 mLs (10 Units total) into the skin at bedtime. Patient taking differently: Inject 12 Units into the skin 2 (two) times daily.  11/11/15   Standley BrookingGoodrich, Daniel P, MD  labetalol (NORMODYNE) 300 MG tablet Take 300 mg by mouth 2 (two) times daily. Hold if systolic BP is less than 100 10/30/15   [provider]  levothyroxine (SYNTHROID, LEVOTHROID) 50 MCG tablet Take 50 mcg by mouth daily before breakfast.    [provider]  loratadine (CLARITIN) 10 MG tablet Take 10 mg by mouth daily.    [provider]  magnesium hydroxide (MILK OF MAGNESIA) 400 MG/5ML suspension Take 30 mLs by mouth daily as needed for mild constipation.    [provider]  Magnesium Sulfate, Laxative, (EPSOM SALT) GRAN by Does not apply route. Soak both feet in lukewarm epsom salt water 2-3 times per week for 10-15 minutes.    [provider]  mirtazapine (REMERON) 15 MG tablet Take 15 mg by mouth daily. For weight loss.    [provider]  potassium chloride SA (K-DUR,KLOR-CON) 20 MEQ tablet Take 20 mEq by mouth daily.    [provider]  tamsulosin (FLOMAX) 0.4 MG CAPS capsule Take 0.4 mg by mouth daily. Hold if systolic BP is less than 100 10/30/15   [provider]  tolterodine (DETROL) 1 MG tablet Take 1 mg by mouth daily as needed (bladder).    [provider]    Physical Exam: Vitals:   11/30/17 1300 11/30/17 1330 11/30/17 1400 11/30/17 1430  BP: 110/72 123/67 127/79 114/77  Pulse: 84 76 84 81  Resp: 12 19 (!) 24 15  SpO2: 95% 94% 93% 93%    Constitutional: NAD, nonverbal with extremity contractures Vitals:   11/30/17 1300 11/30/17 1330 11/30/17 1400 11/30/17 1430  BP: 110/72  123/67 127/79 114/77  Pulse: 84 76 84 81  Resp: 12 19 (!) 24 15  SpO2: 95% 94% 93% 93%   Eyes: lids and conjunctivae normal ENMT: Dry with poor dentition Neck: normal, supple Respiratory: clear to auscultation bilaterally. Normal respiratory effort. No accessory muscle use.  Cardiovascular: Regular rate and rhythm, no murmurs. No extremity edema. Abdomen: no tenderness, no distention. Bowel sounds positive.  Musculoskeletal:  No joint deformity upper and lower extremities.   Skin: no rashes, lesions, ulcers.   Labs on Admission: I have personally reviewed following labs and imaging studies  CBC: Recent Labs  Lab 11/30/17 1154  WBC 10.7*  NEUTROABS 8.6*  HGB 11.1*  HCT 35.7*  MCV 95.2  PLT 273   Basic Metabolic Panel: Recent Labs  Lab 11/30/17 1154  NA 139  K 4.2  CL 105  CO2 25  GLUCOSE 185*  BUN 26*  CREATININE 1.18  CALCIUM 8.6*   GFR: CrCl cannot be calculated (Unknown ideal weight.). Liver Function Tests: Recent Labs  Lab 11/30/17 1154  AST 27  ALT 15  ALKPHOS 82  BILITOT 0.6  PROT 7.1  ALBUMIN 2.6*   No results for input(s): LIPASE, AMYLASE in the last 168 hours. No results for input(s): AMMONIA in the last 168 hours. Coagulation Profile: No results for input(s): INR, PROTIME in the last 168 hours. Cardiac Enzymes: Recent Labs  Lab 11/30/17 1200  TROPONINI 0.03*   BNP (last 3 results) No results for input(s): PROBNP in the last 8760 hours. HbA1C: No results for input(s): HGBA1C in the last 72 hours. CBG: Recent Labs  Lab 11/30/17 1207  GLUCAP 172*   Lipid Profile: No results for input(s): CHOL, HDL, LDLCALC, TRIG, CHOLHDL, LDLDIRECT in the last 72 hours. Thyroid Function Tests: No results for input(s): TSH, T4TOTAL, FREET4, T3FREE, THYROIDAB in the last 72 hours. Anemia Panel: No results for input(s): VITAMINB12, FOLATE, FERRITIN, TIBC, IRON, RETICCTPCT in the last 72 hours. Urine analysis:    Component Value Date/Time    COLORURINE YELLOW 11/30/2017 1159   APPEARANCEUR TURBID (A) 11/30/2017 1159   LABSPEC 1.013 11/30/2017 1159   PHURINE 8.0 11/30/2017 1159   GLUCOSEU NEGATIVE 11/30/2017 1159   HGBUR SMALL (A) 11/30/2017 1159   BILIRUBINUR NEGATIVE 11/30/2017 1159   KETONESUR NEGATIVE 11/30/2017 1159   PROTEINUR 100 (A) 11/30/2017 1159   UROBILINOGEN 0.2 04/15/2014 1946   NITRITE NEGATIVE 11/30/2017 1159   LEUKOCYTESUR MODERATE (A) 11/30/2017 1159    Radiological Exams on Admission: Dg Chest Portable 1 View  Result Date: 11/30/2017 CLINICAL DATA:  Weakness, cough diabetes.  Nonsmoker. EXAM: PORTABLE CHEST 1 VIEW COMPARISON:  None. FINDINGS: Normal cardiomediastinal silhouette. BILATERAL pulmonary opacities are increased, suspect mild vascular congestion; borderline pulmonary edema not excluded, versus viral pneumonitis. No definite consolidation, effusion, or pneumothorax. Bones unremarkable. IMPRESSION: Worsening aeration. BILATERAL pulmonary opacities, could represent early pulmonary edema or viral pneumonitis. Electronically Signed   By: Elsie Stain M.D.   On: 11/30/2017 12:29    EKG: Independently reviewed. SR at 79bpm.  Assessment/Plan Principal Problem:   Acute encephalopathy Active Problems:   Lower urinary tract infectious disease   Neurogenic bladder   Pneumonia   Dysphagia   Protein calorie malnutrition (HCC)    1. Acute encephalopathy with generalized weakness.  This is secondary to poor oral intake and some dehydration with infections as noted below.  Continue IV fluid hydration and monitor with neurochecks.  Patient is essentially bedbound and will maintain on fall precautions. 2. Catheter associated UTI.  Patient has had recent suprapubic exchange 1 week ago due to infection with neurogenic bladder.  He continues to have ongoing minimal drainage of purulent material.  Will maintain on broad-spectrum IV antibiotics to include Zosyn and vancomycin as he is institutionalized.  Will follow  cultures.  Obtain bladder scan to ensure that there is no pocket of infection. 3. Bilateral pulmonary opacities suggestive of viral pneumonitis.  I do not believe this is sign of edema as patient appears generally dry.  Will check respiratory panel as well as procalcitonin and maintain on broad-spectrum empiric antibiotics and follow cultures.  Patient has minimal symptomatology related to this.  There may also be some component of silent aspiration that is ongoing and will maintain n.p.o. with swallow evaluation. 4. Protein calorie malnutrition.  Patient overall has  a dismal prognosis and family/friends at bedside are not interested in any aggressive measures to include tube feedings.  I believe that palliative care is in his best interest and therefore will consult for further recommendations and goals of care. 5. Diabetes with hyperglycemia.  Maintain on half of Lantus dose with sliding scale insulin coverage. 6. Hypothyroidism.  Maintain on Synthroid.  Check TSH level. 7. Neurogenic bladder.  Maintain on Detrol and Flomax. 8. Hypertension.  Soft blood pressure readings currently we will hold labetalol as well as Lasix.   DVT prophylaxis: Lovenox Code Status: Full Family Communication: Distant family at bedside Disposition Plan:Empiric abx treatment; follow urine and blood cx Consults called:Palliative Admission status: Inpatient, MedSurg   Jacob Chasen Hoover Brunette DO [facility] Pager [phone]  If 7PM-7AM, please contact night-coverage [email or web address] Password [email or web address]  11/30/2017, 3:28 PM

## 2017-11-30 NOTE — ED Triage Notes (Signed)
Pt brought in by [facility] from [facility] for c/o decrease PO intake x 1 week. Pt received IV fluids with no improvement in symptoms.

## 2017-12-01 ENCOUNTER — Encounter (HOSPITAL_COMMUNITY): Payer: Self-pay | Admitting: Primary Care

## 2017-12-01 DIAGNOSIS — Z515 Encounter for palliative care: Secondary | ICD-10-CM

## 2017-12-01 DIAGNOSIS — Z7189 Other specified counseling: Secondary | ICD-10-CM

## 2017-12-01 DIAGNOSIS — L8990 Pressure ulcer of unspecified site, unspecified stage: Secondary | ICD-10-CM

## 2017-12-01 DIAGNOSIS — R532 Functional quadriplegia: Secondary | ICD-10-CM

## 2017-12-01 DIAGNOSIS — J189 Pneumonia, unspecified organism: Secondary | ICD-10-CM

## 2017-12-01 LAB — BLOOD CULTURE ID PANEL (REFLEXED)
Acinetobacter baumannii: NOT DETECTED
CANDIDA KRUSEI: NOT DETECTED
Candida albicans: NOT DETECTED
Candida glabrata: NOT DETECTED
Candida parapsilosis: NOT DETECTED
Candida tropicalis: NOT DETECTED
ENTEROCOCCUS SPECIES: NOT DETECTED
ESCHERICHIA COLI: NOT DETECTED
Enterobacter cloacae complex: NOT DETECTED
Enterobacteriaceae species: NOT DETECTED
HAEMOPHILUS INFLUENZAE: NOT DETECTED
Klebsiella oxytoca: NOT DETECTED
Klebsiella pneumoniae: NOT DETECTED
LISTERIA MONOCYTOGENES: NOT DETECTED
Methicillin resistance: NOT DETECTED
Neisseria meningitidis: NOT DETECTED
PROTEUS SPECIES: NOT DETECTED
Pseudomonas aeruginosa: NOT DETECTED
SERRATIA MARCESCENS: NOT DETECTED
STAPHYLOCOCCUS AUREUS BCID: NOT DETECTED
STAPHYLOCOCCUS SPECIES: DETECTED — AB
Streptococcus agalactiae: NOT DETECTED
Streptococcus pneumoniae: NOT DETECTED
Streptococcus pyogenes: NOT DETECTED
Streptococcus species: NOT DETECTED

## 2017-12-01 LAB — GLUCOSE, CAPILLARY
GLUCOSE-CAPILLARY: 114 mg/dL — AB (ref 70–99)
Glucose-Capillary: 105 mg/dL — ABNORMAL HIGH (ref 70–99)
Glucose-Capillary: 116 mg/dL — ABNORMAL HIGH (ref 70–99)
Glucose-Capillary: 125 mg/dL — ABNORMAL HIGH (ref 70–99)
Glucose-Capillary: 181 mg/dL — ABNORMAL HIGH (ref 70–99)
Glucose-Capillary: 248 mg/dL — ABNORMAL HIGH (ref 70–99)

## 2017-12-01 LAB — BASIC METABOLIC PANEL
ANION GAP: 7 (ref 5–15)
BUN: 18 mg/dL (ref 8–23)
CALCIUM: 8.2 mg/dL — AB (ref 8.9–10.3)
CO2: 25 mmol/L (ref 22–32)
CREATININE: 0.89 mg/dL (ref 0.61–1.24)
Chloride: 111 mmol/L (ref 98–111)
GFR calc Af Amer: >60 mL/min (ref >60)
GLUCOSE: 117 mg/dL — AB (ref 70–99)
Potassium: 3.3 mmol/L — ABNORMAL LOW (ref 3.5–5.1)
Sodium: 143 mmol/L (ref 135–145)

## 2017-12-01 LAB — HIV ANTIBODY (ROUTINE TESTING W REFLEX): HIV Screen 4th Generation wRfx: NONREACTIVE

## 2017-12-01 LAB — CBC
HEMATOCRIT: 34.8 % — AB (ref 39.0–52.0)
Hemoglobin: 10.6 g/dL — ABNORMAL LOW (ref 13.0–17.0)
MCH: 29.6 pg (ref 26.0–34.0)
MCHC: 30.5 g/dL (ref 30.0–36.0)
MCV: 97.2 fL (ref 78.0–100.0)
PLATELETS: 254 10*3/uL (ref 150–400)
RBC: 3.58 MIL/uL — ABNORMAL LOW (ref 4.22–5.81)
RDW: 12.8 % (ref 11.5–15.5)
WBC: 9.3 10*3/uL (ref 4.0–10.5)

## 2017-12-01 LAB — LACTIC ACID, PLASMA: Lactic Acid, Venous: 1.3 mmol/L (ref 0.5–1.9)

## 2017-12-01 LAB — PROCALCITONIN: Procalcitonin: 0.75 ng/mL

## 2017-12-01 LAB — TROPONIN I: Troponin I: 0.03 ng/mL (ref <0.03)

## 2017-12-01 MED ORDER — LABETALOL HCL 200 MG PO TABS
300.0000 mg | ORAL_TABLET | Freq: Two times a day (BID) | ORAL | Status: DC
  Administered 2017-12-01 – 2017-12-04 (×6): 300 mg via ORAL
  Filled 2017-12-01 (×6): qty 2

## 2017-12-01 MED ORDER — VANCOMYCIN HCL 500 MG IV SOLR
INTRAVENOUS | Status: AC
  Filled 2017-12-01: qty 500

## 2017-12-01 MED ORDER — POTASSIUM CHLORIDE 20 MEQ/15ML (10%) PO SOLN
40.0000 meq | Freq: Once | ORAL | Status: AC
  Administered 2017-12-01: 40 meq via ORAL
  Filled 2017-12-01: qty 30

## 2017-12-01 NOTE — Consult Note (Signed)
Consultation Note Date: 12/01/2017   Patient Name: Jacob GavelGene R Agramonte  DOB: Mar 22, 1956  MRN: [id number]  Age / Sex: 62 y.o., male  PCP: Bernerd LimboAriza, Fernando Enrique, MD Referring Physician: Erick BlinksShah, Pratik D, DO  Reason for Consultation: Establishing goals of care and Psychosocial/spiritual support  HPI/Patient Profile: 62 y.o. male  with past medical history of neuromuscular degenerative disorder with bedbound status, developmental delay, chronic suprapubic catheter secondary to neurogenic bladder with recent catheter associated UTI and exchange of catheter by urology 1 week ago, type 2 diabetes, low thyroid, chronic cough related to aspiration, hypertension admitted on 11/30/2017 with acute encephalopathy with catheter associated UTI and bilateral pulmonary opacities suggestive of viral pneumonitis.   Clinical Assessment and Goals of Care: Mr. Rada HayMurrell "[name]" is resting quietly in bed.  He will make and keep eye contact.  He appears calm, cooperative, but very childlike.  He is able to make his basic needs known.  No family at bedside at this time.  His sister/legal guardian, Adria DillBrenda Sneed is also hospitalized at this point with pneumonia.  I meet she and her husband in her room. We do a life review of body, talking about his acute and chronic illnesses.  We talked about his decline over the last 2-1/2 years.  When I mention his degenerative neuromuscular disorder Steward DroneBrenda tearfully states, "it is killing me".  I share that things look different now, she agrees, but then quickly goes on to talk about his urinary tract infection.  She shares that he has not been eating for several weeks, she thought this was a urinary tract infection but states she had difficulty in having testing.  She talks about working with the SNF staff stating, "if this baby" (does not get better/get treatment) she will "own" the nursing and facility.  I share a  diagram of the chronic illness pathway, what is normal and expected. Steward DroneBrenda is tearful talking about her own illness, stating that she has cancer and now pneumonia that she cannot get rid of.  She is also tearful talking about having to "give up" her mother a few years ago.  Steward DroneBrenda states that everyone always told her that she would be rewarded for her work, but states look at me now. I shared that this is a difficult time to have these discussions, and I will return tomorrow to talk more about how to best care for body, and answer questions.  I believe Steward DroneBrenda with the thought that these discussions are to show respect to [facility].   Healthcare power of attorney LEGAL GUARDIAN -sister Adria DillBrenda Sneed is but he is legal guardian.  Paperwork is noted in epic chart.  Steward DroneBrenda states that she was [age] when [name] was born, and he has always been her "baby".  She shares that he lived with their mother who passed away approximately 2 to 3 years ago.  Steward DroneBrenda cared for their mother approximately 10 years, who passed in her mid to late 6580s with dementia.  She tearfully states that she had to "give up" her  mother.  [name] states that as legal guardian, if she were unable to fill her duties her request is that her husband, Jackie's need be buddies representative.  She also asks that sister-in-law, Iver Nestle be allowed to get information related to [name]'s health concerns.   SUMMARY OF RECOMMENDATIONS   At this point, full scope treatment. Continue CODE STATUS discussions. Continue end-of-life discussions. Return to [facility]F.  Code Status/Advance Care Planning:  Full code -we talked about the concepts of treat the treatable but no extraordinary measures.  I share that if, when, but his heart naturally stops, should we press on his chest to try and restart it or give him comfort and dignity.  Brenda tearfully states, "don't go there".  She returns to talking about the loss of her mother approximately 2-1/2  years ago.  I share that these discussions are for respect for [name].  Symptom Management:   Per hospitalist, no additional needs at this time.  Palliative Prophylaxis:   Aspiration, Palliative Wound Care and Turn Reposition  Additional Recommendations (Limitations, Scope, Preferences):  Full Scope Treatment  Psycho-social/Spiritual:   Desire for further Chaplaincy support:no  Additional Recommendations: Caregiving  Support/Resources and Education on Hospice  Prognosis:   Unable to determine 6 months or less would not be surprising based on frailty, bedbound status, chronic illness, possibility of positive blood culture.  Discharge Planning: To be determined, based on outcomes.  Anticipate return to residential SNF if possible      Primary Diagnoses: Present on Admission: . Neurogenic bladder . Lower urinary tract infectious disease . Encephalopathy   I have reviewed the medical record, interviewed the patient and family, and examined the patient. The following aspects are pertinent.  Past Medical History:  Diagnosis Date  . Arthritis   . Bladder mass   . BPH (benign prostatic hyperplasia)   . CHF (congestive heart failure) (HCC)   . Chronic cough   . Cognitive communication deficit   . Complication of anesthesia    agitaton after anesthesia  . Diabetes mellitus    type2  . DKA (diabetic ketoacidoses) (HCC)   . Dysphagia   . Encephalopathy   . Family history of adverse reaction to anesthesia    sister hard to wake up  . Gait abnormality   . Gram-positive bacteremia   . History of recurrent UTIs   . Hypernatremia   . Hypertension   . Hypokalemia   . Hypothyroidism   . MR (mental retardation)   . Muscle weakness (generalized)   . Postural kyphosis   . Renal disorder   . Sepsis(995.91)   . Severe intellectual disability   . Severe mental retardation   . Urinary retention   . UTI (urinary tract infection)    Social History   Socioeconomic History    . Marital status: Married    Spouse name: Not on file  . Number of children: Not on file  . Years of education: Not on file  . Highest education level: Not on file  Occupational History  . Not on file  Social Needs  . Financial resource strain: Not on file  . Food insecurity:    Worry: Not on file    Inability: Not on file  . Transportation needs:    Medical: Not on file    Non-medical: Not on file  Tobacco Use  . Smoking status: Never Smoker  . Smokeless tobacco: Never Used  Substance and Sexual Activity  . Alcohol use: No  . Drug  use: No  . Sexual activity: Never  Lifestyle  . Physical activity:    Days per week: Not on file    Minutes per session: Not on file  . Stress: Not on file  Relationships  . Social connections:    Talks on phone: Not on file    Gets together: Not on file    Attends religious service: Not on file    Active member of club or organization: Not on file    Attends meetings of clubs or organizations: Not on file    Relationship status: Not on file  Other Topics Concern  . Not on file  Social History Narrative  . Not on file   History reviewed. No pertinent family history. Scheduled Meds: . Chlorhexidine Gluconate Cloth  6 each Topical Q0600  . enoxaparin (LOVENOX) injection  40 mg Subcutaneous Q24H  . fluticasone  1 spray Each Nare BID  . insulin aspart  0-5 Units Subcutaneous QHS  . insulin aspart  0-9 Units Subcutaneous TID WC  . insulin glargine  5 Units Subcutaneous QHS  . levothyroxine  50 mcg Oral QAC breakfast  . mirtazapine  15 mg Oral Daily  . mupirocin ointment  1 application Nasal BID  . oxybutynin  5 mg Oral QHS  . tamsulosin  0.4 mg Oral Daily   Continuous Infusions: . piperacillin-tazobactam (ZOSYN)  IV 3.375 g (12/01/17 1035)  . vancomycin Stopped (12/01/17 0211)   PRN Meds:.acetaminophen **OR** acetaminophen, benzonatate, ondansetron **OR** ondansetron (ZOFRAN) IV Medications Prior to Admission:  Prior to Admission  medications   Medication Sig Start Date End Date Taking? Authorizing Provider  acetaminophen (TYLENOL) 500 MG tablet Take 500 mg by mouth every 6 (six) hours as needed.   Yes [provider]  acetaminophen (TYLENOL) 650 MG CR tablet Take 650 mg by mouth every 8 (eight) hours as needed for pain.   Yes [provider]  acetic acid 0.25 % irrigation Irrigate with 1 application as directed 2 (two) times daily. Irrigate catheter every 12 hours   Yes [provider]  Alum & Mag Hydroxide-Simeth (ANTACID & ANTIGAS) 200-200-20 MG/5ML SUSP Take 30 mLs by mouth daily as needed (heartburn).   Yes [provider]  furosemide (LASIX) 20 MG tablet Take 20 mg by mouth daily.   Yes [provider]  insulin aspart (NOVOLOG) 100 UNIT/ML injection Inject 2-10 Units into the skin 3 (three) times daily before meals. If 151-200 = 2 units, notify NP/MD if <60,   201-250 = 4 units, 251-300 = 6 units, 301-350 = 8 units, 351-400 = 10 units Notify MD/NP if > 400   Yes [provider]  insulin glargine (LANTUS) 100 UNIT/ML injection Inject 0.1 mLs (10 Units total) into the skin at bedtime. Patient taking differently: Inject 12 Units into the skin 2 (two) times daily.  11/11/15  Yes Standley Brooking, MD  ipratropium (ATROVENT) 0.06 % nasal spray Place 2 sprays into both nostrils 2 (two) times daily as needed for rhinitis.   Yes [provider]  labetalol (NORMODYNE) 300 MG tablet Take 300 mg by mouth 2 (two) times daily. Hold if systolic BP is less than 100 10/30/15  Yes [provider]  levothyroxine (SYNTHROID, LEVOTHROID) 50 MCG tablet Take 50 mcg by mouth daily before breakfast.   Yes [provider]  magnesium hydroxide (MILK OF MAGNESIA) 400 MG/5ML suspension Take 30 mLs by mouth daily as needed for mild constipation.   Yes [provider]  mirtazapine (  REMERON) 15 MG tablet Take 15 mg by mouth daily. For weight loss.   Yes [provider]  potassium chloride SA (K-DUR,KLOR-CON) 20 MEQ tablet Take 20 mEq by mouth daily.   Yes [provider]   No Known Allergies Review of Systems  Unable to perform ROS: Mental status change    Physical Exam  Constitutional: No distress.  Appears acutely/chronically ill, frail, childlike.  HENT:  Head: Atraumatic.  Some temporal wasting  Cardiovascular: Normal rate.  Pulmonary/Chest: Effort normal. No respiratory distress.  Abdominal: Soft. He exhibits no distension.  Musculoskeletal: He exhibits deformity. He exhibits no edema.  Contractures all extremities  Neurological: He is alert.  Developmental  delays, not asked orientation questions  Skin: Skin is warm and dry.  Wounds as noted by nursing  Psychiatric:  Childlike, cooperative  Nursing note and vitals reviewed.   Vital Signs: BP 133/77 (BP Location: Left Arm)   Pulse 85   Temp 98.8 F (37.1 C) (Oral)   Resp 14   Ht 5\' 9"  (1.753 m)   Wt 61.1 kg (134 lb 11.2 oz)   SpO2 99%   BMI 19.89 kg/m  Pain Scale: PAINAD   Pain Score: 0-No pain   SpO2: SpO2: 99 % O2 Device:SpO2: 99 % O2 Flow Rate: .   IO: Intake/output summary:   Intake/Output Summary (Last 24 hours) at 12/01/2017 1352 Last data filed at 12/01/2017 0600 Gross per 24 hour  Intake 312.92 ml  Output 650 ml  Net -337.08 ml    LBM: Last BM Date: (unknown) Baseline Weight: Weight: 61.1 kg (134 lb 11.2 oz) Most recent weight: Weight: 61.1 kg (134 lb 11.2 oz)     Palliative Assessment/Data:   Flowsheet Rows     Most Recent Value  Intake Tab  Referral Department  Hospitalist  Unit at Time of Referral  Med/Surg Unit  Palliative Care Primary Diagnosis  Neurology  Date Notified  11/30/17  Palliative Care Type  New Palliative care  Reason for referral  Clarify Goals of Care  Date of Admission  11/30/17  Date first seen by Palliative Care  12/01/17  # of days Palliative referral response time  1 Day(s)  # of days IP prior to  Palliative referral  0  Clinical Assessment  Palliative Performance Scale Score  30%  Pain Max last 24 hours  Not able to report  Pain Min Last 24 hours  Not able to report  Dyspnea Max Last 24 Hours  Not able to report  Dyspnea Min Last 24 hours  Not able to report  Psychosocial & Spiritual Assessment  Palliative Care Outcomes  Patient/Family meeting held?  Yes  Who was at the meeting?  sister/legal guard at bedside  Palliative Care Outcomes  Clarified goals of care, Provided psychosocial or spiritual support      Time In: 1410 Time Out: 1520 Time Total: 70 minutes Greater than 50%  of this time was spent counseling and coordinating care related to the above assessment and plan.  Signed by: Katheran Awe, NP   Please contact Palliative Medicine Team phone at [phone] for questions and concerns.  For individual provider: See Loretha Stapler

## 2017-12-01 NOTE — Progress Notes (Signed)
Nutrition Brief Note  Patient identified on the Malnutrition Screening Tool (MST) Report  PMH: Mental disability, communication deficit, DM, recurrent UTI's. He presents from SNF with UTI. Appetite reported as poor the past week likely related to his acute infection.   Distant wt loss noted between June of 2017 and March of 2018. Since then wt appears stable between 135-138 lb suggesting consistent nutrition intake. Poor muscle tone expected given his low functional state.  Wt Readings from Last 15 Encounters:  11/30/17 134 lb 11.2 oz (61.1 kg)  10/18/16 135 lb (61.2 kg)  08/02/16 138 lb (62.6 kg)  11/02/15 170 lb (77.1 kg)  04/15/14 169 lb (76.7 kg)  04/14/14 169 lb (76.7 kg)  04/10/14 169 lb (76.7 kg)  03/04/14 169 lb 12.1 oz (77 kg)  08/04/12 152 lb 12.5 oz (69.3 kg)  06/12/11 170 lb (77.1 kg)  06/05/11 173 lb 4.5 oz (78.6 kg)    Body mass index is 19.89 kg/m. Patient meets criteria for normal based on current BMI.   Current diet order is NPO.  When diet is advanced RD will review intake and order appropriate nutrition interventions.  Labs and medications reviewed.   BMP Latest Ref Rng & Units 12/01/2017 11/30/2017 05/17/2016  Glucose 70 - 99 mg/dL 098(J117(H) 191(Y185(H) 782(N168(H)  BUN 8 - 23 mg/dL 18 56(O26(H) 16  Creatinine 0.61 - 1.24 mg/dL 1.300.89 8.651.18 7.841.01  Sodium 135 - 145 mmol/L 143 139 136  Potassium 3.5 - 5.1 mmol/L 3.3(L) 4.2 4.0  Chloride 98 - 111 mmol/L 111 105 105  CO2 22 - 32 mmol/L 25 25 25   Calcium 8.9 - 10.3 mg/dL 8.2(L) 8.6(L) 8.7(L)     Jacob ShiversLynn Khylee Algeo MS,RD,CSG,LDN Office: [phone] Pager: [phone]

## 2017-12-01 NOTE — Clinical Social Work Note (Signed)
Clinical Social Work Assessment  Patient Details  Name: Jacob GavelGene R Broadus MRN: [id number] Date of Birth: Jan 03, 1956  Date of referral:  12/01/17               Reason for consult:  Discharge Planning                Permission sought to share information with:    Permission granted to share information::     Name::        Agency::     Relationship::     Contact Information:     Housing/Transportation Living arrangements for the past 2 months:  Skilled Nursing Facility Source of Information:  Facility Patient Interpreter Needed:  None Criminal Activity/Legal Involvement Pertinent to Current Situation/Hospitalization:  No - Comment as needed Significant Relationships:  Siblings Lives with:  Facility Resident Do you feel safe going back to the place where you live?  Yes Need for family participation in patient care:  No (Coment)  Care giving concerns: [facility]   Social Worker assessment / plan: Pt is a 62 year old male admitted from [facility]. Plan is for return to [facility] at dc. Spoke with Eunice Blaseebbie at [facility] today to update on pt. Per Eunice Blaseebbie, pt has lived there since March 2018. Until the last month, pt had been fairly independent in his care. Eunice BlaseDebbie states that pt has had a significant decline in the last month and is almost totally bed bound and total care at this point. Per MD, Palliative consult will be ordered. Pt's sister is his family support.   Employment status:  Disabled (Comment on whether or not currently receiving Disability) Insurance information:  Teacher, English as a foreign languageManaged Medicare, [place] Recommend[place]:  Not assessed at this time Information / Referral to community resources:     Patient/Family's Response to care: Pt accepting of care.  Patient/Family's Understanding of and Emotional Response to Diagnosis, Current Treatment, and Prognosis: It does not appear that pt is able to fully understand diagnosis and treatment recommendations. No emotional distress  identified.  Emotional Assessment Appearance:  Appears stated age Attitude/Demeanor/Rapport:    Affect (typically observed):  Calm Orientation:  Oriented to Self Alcohol / Substance use:  Not Applicable Psych involvement (Current and /or in the community):  No (Comment)  Discharge Needs  Concerns to be addressed:  Discharge Planning Concerns Readmission within the last 30 days:  No Current discharge risk:  None Barriers to Discharge:  No Barriers Identified   Elliot GaultKathleen Odas Ozer, LCSW 12/01/2017, 11:14 AM

## 2017-12-01 NOTE — Evaluation (Signed)
Clinical/Bedside Swallow Evaluation Patient Details  Name: [name] MRN: [id number] Date of Birth: [birth year]  Today's Date: 12/01/2017 Time: SLP Start Time (ACUTE ONLY): 1000 SLP Stop Time (ACUTE ONLY): 1027 SLP Time Calculation (min) (ACUTE ONLY): 27 min  Past Medical History:  Past Medical History:  Diagnosis Date  . Arthritis   . Bladder mass   . BPH (benign prostatic hyperplasia)   . CHF (congestive heart failure) (HCC)   . Chronic cough   . Cognitive communication deficit   . Complication of anesthesia    agitaton after anesthesia  . Diabetes mellitus    type2  . DKA (diabetic ketoacidoses) (HCC)   . Dysphagia   . Encephalopathy   . Family history of adverse reaction to anesthesia    sister hard to wake up  . Gait abnormality   . Gram-positive bacteremia   . History of recurrent UTIs   . Hypernatremia   . Hypertension   . Hypokalemia   . Hypothyroidism   . MR (mental retardation)   . Muscle weakness (generalized)   . Postural kyphosis   . Renal disorder   . Sepsis(995.91)   . Severe intellectual disability   . Severe mental retardation   . Urinary retention   . UTI (urinary tract infection)    Past Surgical History:  Past Surgical History:  Procedure Laterality Date  . CYSTOSCOPY N/A 08/02/2016   Procedure: CYSTOSCOPY;  Surgeon: Marcine Matar, MD;  Location: [facility];  Service: Urology;  Laterality: N/A;  . INSERTION OF SUPRAPUBIC CATHETER N/A 08/02/2016   Procedure: INSERTION OF SUPRAPUBIC CATHETER;  Surgeon: Marcine Matar, MD;  Location: [facility];  Service: Urology;  Laterality: N/A;  . KIDNEY STONE SURGERY     x 1  . tooth extraction surgery  as child   HPI:  [name] is a 62 y.o. male with medical history significant for neuromuscular degenerative disorder with bedbound status, mental retardation, chronic suprapubic catheter secondary to neurogenic bladder, type 2 diabetes, hypothyroidism, chronic cough with aspiration hypertension, and  hypothyroidism who was recently been treated for catheter associated urinary tract infection with exchange of his suprapubic catheter by urology approximately 1 week ago.  He apparently has finished his course of antibiotics, but family members cannot recall the name of the antibiotic he was on.  They seem to believe that he had an E. coli infection, but he continues to have ongoing drainage of pus from the catheter site with some cloudy looking urine.  Apparently, patient has become more confused and weak with decreased oral intake over the last 1 week.  They state that he can eat if he has been fed, but his arms have grown so weak that he has now a functional quadriplegic.Chest x-ray with bilateral pulmonary opacities suggestive of edema versus viral pneumonitis.   Assessment / Plan / Recommendation Clinical Impression  Clinical swallow evaluation completed in room. Pt known to SLP service from previous admissions. Pt was last seen by SLP in [place] and had outpatient MBSS completed with recommendation for regular textures and thin liquids, however it should be noted that the MBSS was extremely limited due to poor Pt cooperation/participation. Only 4 swallows were observed in that assessment and Pt noted to have significant pharyngeal residue. I do not feel that it was an accurate picture of swallow function. Pt was previously cared for by his sister, Adria Dill, however he has been at [facility] for the past year. His sister is currently undergoing cancer treatment and is  admitted down the hall. I was able to speak with his sister regarding his care. She stated that his diet as been modified in the past (thickened liquids) and he did not like it and stopped drinking the liquids. Pt does show signs/symptoms of aspiration at bedside with liquids- multiple swallows elicited followed by a delayed dry, strong cough. Pt accepted a few bites of ice cream, applesauce, and graham crackers resulting in lingual residue  with solids. Pt benefited from liquid wash to clear oral residue.   Recommend D2/chopped with thin liquids via careful feeding with oral care before and after po. Pt is at high risk for aspiration given dependent feeder status, cognitive impairment, and bedbound status. He does have a strong cough and has presumably coughed with po intake for the past several years. Pt's sister verbalized that she would like for "[name]" to be able to eat/drink what he likes. I do not think Pt is a good candidate for a repeat MBSS given poor participation during the last assessment. SLP will sign off at this time. Reconsult if indicated. Pt would benefit from palliative care consult/discussion with his sister.  SLP Visit Diagnosis: Dysphagia, unspecified (R13.10)    Aspiration Risk  Moderate aspiration risk;Risk for inadequate nutrition/hydration    Diet Recommendation Dysphagia 2 (Fine chop);Thin liquid   Liquid Administration via: Straw Medication Administration: Crushed with puree Supervision: Staff to assist with self feeding;Full supervision/cueing for compensatory strategies Compensations: Slow rate;Small sips/bites;Multiple dry swallows after each bite/sip Postural Changes: Seated upright at 90 degrees;Remain upright for at least 30 minutes after po intake    Other  Recommendations Oral Care Recommendations: Oral care BID;Staff/trained caregiver to provide oral care;Oral care before and after PO Other Recommendations: Clarify dietary restrictions   Follow up Recommendations 24 hour supervision/assistance      Frequency and Duration            Prognosis Prognosis for Safe Diet Advancement: Guarded Barriers to Reach Goals: Cognitive deficits      Swallow Study   General Date of Onset: 11/30/17 HPI: [name] is a 62 y.o. male with medical history significant for neuromuscular degenerative disorder with bedbound status, mental retardation, chronic suprapubic catheter secondary to neurogenic  bladder, type 2 diabetes, hypothyroidism, chronic cough with aspiration hypertension, and hypothyroidism who was recently been treated for catheter associated urinary tract infection with exchange of his suprapubic catheter by urology approximately 1 week ago.  He apparently has finished his course of antibiotics, but family members cannot recall the name of the antibiotic he was on.  They seem to believe that he had an E. coli infection, but he continues to have ongoing drainage of pus from the catheter site with some cloudy looking urine.  Apparently, patient has become more confused and weak with decreased oral intake over the last 1 week.  They state that he can eat if he has been fed, but his arms have grown so weak that he has now a functional quadriplegic.Chest x-ray with bilateral pulmonary opacities suggestive of edema versus viral pneumonitis. Type of Study: Bedside Swallow Evaluation Previous Swallow Assessment: 2017 MBSS regular/thin at [facility] Diet Prior to this Study: NPO Temperature Spikes Noted: No Respiratory Status: Room air History of Recent Intubation: No Behavior/Cognition: Alert;Cooperative Oral Cavity Assessment: Dried secretions;Dry Oral Care Completed by SLP: Yes Oral Cavity - Dentition: Poor condition Vision: Impaired for self-feeding Self-Feeding Abilities: Total assist Patient Positioning: Upright in bed Baseline Vocal Quality: Normal Volitional Cough: Strong Volitional Swallow: Able to elicit  Oral/Motor/Sensory Function Overall Oral Motor/Sensory Function: Mild impairment   Ice Chips Ice chips: Within functional limits Presentation: Spoon   Thin Liquid Thin Liquid: Impaired Presentation: Straw Pharyngeal  Phase Impairments: Multiple swallows;Cough - Delayed    Nectar Thick Nectar Thick Liquid: Impaired Presentation: Spoon Pharyngeal Phase Impairments: Multiple swallows;Cough - Delayed   Honey Thick Honey Thick Liquid: Within functional limits Presentation:  Straw   Puree Puree: Within functional limits Presentation: Spoon   Solid      Solid: Impaired Presentation: Spoon Oral Phase Impairments: Impaired mastication Oral Phase Functional Implications: Prolonged oral transit;Impaired mastication;Oral residue Pharyngeal Phase Impairments: Cough - Delayed       Thank you,  Havery Moros, CCC-SLP [phone]  Margaretha Mahan 12/01/2017,11:15 AM

## 2017-12-01 NOTE — Progress Notes (Addendum)
[name], Jacob DOA: 11/30/2017 PCP: Bernerd Limbo, Jacob   Jacob Narrative:   PRINTESS [name] is a 62 y.o. male with medical history significant for neuromuscular degenerative disorder with bedbound [name], [name], [name], [name], hypothyroidism, Jacob cough with aspiration hypertension, and hypothyroidism who was recently been treated for catheter associated urinary tract infection with exchange of his suprapubic catheter by urology approximately 1 week ago.    He has been admitted with generalized weakness and poor oral intake.   Assessment & Plan:   Principal Problem:   Acute encephalopathy Active Problems:   Lower urinary tract infectious disease   Neurogenic [name]   Pneumonia   Dysphagia   Protein calorie malnutrition (HCC)   Encephalopathy   Pressure injury of skin   1. Acute encephalopathy with generalized weakness with baseline MR.  This is secondary to poor oral intake and some dehydration with infections as noted below.  Continue IV fluid hydration and monitor with neurochecks.  Patient is essentially bedbound and will maintain on fall precautions. Palliative Care involved in discussions with half-sister who is currently hospitalized. Plans to continue current level of care. 2. Catheter associated UTI.  Patient has had recent suprapubic exchange 1 week ago due to infection with neurogenic [name].  He continues to have ongoing minimal drainage of purulent material.  Will maintain on broad-spectrum IV antibiotics to include Zosyn and DC Vancomycin. Will follow cultures.  [name] scan negative for pocket of infection.  He is also noted to have 1 culture positive with methicillin sensitive coagulase-negative staph which is likely a contaminant. 3. Bilateral pulmonary opacities suggestive of viral pneumonitis.  I do not believe this is sign of edema as  patient appears generally dry.  Will check respiratory panel as well as procalcitonin and maintain on broad-spectrum empiric antibiotics and follow cultures.  Patient has minimal symptomatology related to this.  There may also be some component of silent aspiration that is ongoing.  Swallow evaluation completed with recommendations for dysphagia 2 diet at this time. 4. Protein calorie malnutrition.  Patient overall has a dismal prognosis and family/friends at bedside are not interested in any aggressive measures to include tube feedings. Appreciate further dietary recommendations for supplementation now that he is on dysphagia 2 diet. 5. [name] with hyperglycemia-improved.  Maintain on half of Lantus dose with sliding scale insulin coverage. 6. Hypothyroidism.  Maintain on Synthroid.  TSH 2.091. 7. Neurogenic [name].  Maintain on Detrol and Flomax. 8. Hypertension.    Soft blood pressure readings have now improved.  Continue to withhold Lasix and restart home labetalol.   DVT prophylaxis:Lovenox Code [name]: Full Family Communication: None Disposition Plan: Continue empiric abx treatment and follow cultures; Palliative care evaluation   Consultants:   Palliative Care  Procedures:   None  Antimicrobials:   Vancomycin and Zosyn 7/3->   Subjective: Patient seen and evaluated today with no new acute complaints or concerns. No acute concerns or events noted overnight.  Objective: Vitals:   11/30/17 2101 11/30/17 2212 12/01/17 0445 12/01/17 1425  BP:  (!) 144/86 133/77 140/89  Pulse:  85 85 (!) 113  Resp:  16 14 16   Temp:  97.9 F (36.6 C) 98.8 F (37.1 C) 98.6 F (37 C)  TempSrc:  Axillary Oral Oral  SpO2: 97% 98% 99% 100%  Weight:      Height:        Intake/Output Summary (  Last 24 hours) at 12/01/2017 1521 Last data filed at 12/01/2017 1300 Gross per 24 hour  Intake 552.92 ml  Output 650 ml  Net -97.08 ml   Filed Weights   11/30/17 1709  Weight: 61.1 kg (134 lb 11.2  oz)    Examination:  General exam: Appears calm and comfortable; laying on side with contractures Respiratory system: Clear to auscultation. Respiratory effort normal. Cardiovascular system: S1 & S2 heard, RRR. No JVD, murmurs, rubs, gallops or clicks. No pedal edema. Gastrointestinal system: Abdomen is nondistended, soft and nontender. No organomegaly or masses felt. Normal bowel sounds heard. Central nervous system: Cannot be assessed Skin: No rashes, lesions or ulcers GU: Suprapubic catheter with some purulent drainage noted; UO yellow and cloudy ENT: Poor dentition    Data Reviewed: I have personally reviewed following labs and imaging studies  CBC: Recent Labs  Lab 11/30/17 1154 12/01/17 0352  WBC 10.7* 9.3  NEUTROABS 8.6*  --   HGB 11.1* 10.6*  HCT 35.7* 34.8*  MCV 95.2 97.2  PLT 273 254   Basic Metabolic Panel: Recent Labs  Lab 11/30/17 1154 12/01/17 0352  NA 139 143  K 4.2 3.3*  CL 105 111  CO2 25 25  GLUCOSE 185* 117*  BUN 26* 18  CREATININE 1.18 0.89  CALCIUM 8.6* 8.2*   GFR: Estimated Creatinine Clearance: 75.3 mL/min (by C-G formula based on SCr of 0.89 mg/dL). Liver Function Tests: Recent Labs  Lab 11/30/17 1154  AST 27  ALT 15  ALKPHOS 82  BILITOT 0.6  PROT 7.1  ALBUMIN 2.6*   No results for input(s): LIPASE, AMYLASE in the last 168 hours. No results for input(s): AMMONIA in the last 168 hours. Coagulation Profile: No results for input(s): INR, PROTIME in the last 168 hours. Cardiac Enzymes: Recent Labs  Lab 11/30/17 1200 11/30/17 1646 11/30/17 2148 12/01/17 0352  TROPONINI 0.03* <0.03 0.03* 0.03*   BNP (last 3 results) No results for input(s): PROBNP in the last 8760 hours. HbA1C: No results for input(s): HGBA1C in the last 72 hours. CBG: Recent Labs  Lab 11/30/17 1952 12/01/17 0000 12/01/17 0451 12/01/17 0735 12/01/17 1107  GLUCAP 200* 125* 105* 114* 181*   Lipid Profile: No results for input(s): CHOL, HDL, LDLCALC,  TRIG, CHOLHDL, LDLDIRECT in the last 72 hours. Thyroid Function Tests: Recent Labs    11/30/17 1246  TSH 2.091   Anemia Panel: No results for input(s): VITAMINB12, FOLATE, FERRITIN, TIBC, IRON, RETICCTPCT in the last 72 hours. Sepsis Labs: Recent Labs  Lab 11/30/17 1208 11/30/17 1646 12/01/17 0352  PROCALCITON  --  0.94 0.75  LATICACIDVEN 1.57  --  1.3    Recent Results (from the past 240 hour(s))  Culture, blood (routine x 2)     [name]: None (Preliminary result)   Collection Time: 11/30/17 12:14 PM  Result Value Ref Range [name]   Specimen Description BLOOD RIGHT ARM  Final   Special Requests   Final    BOTTLES DRAWN AEROBIC AND ANAEROBIC Blood Culture adequate volume   Culture   Final    NO GROWTH < 24 HOURS Performed at [facility], [address], [place], [place] [address]    Report [name] PENDING  Incomplete  Culture, blood (routine x 2)     [name]: None (Preliminary result)   Collection Time: 11/30/17 12:14 PM  Result Value Ref Range [name]   Specimen Description   Final    BLOOD RIGHT HAND Performed at [facility], [address] [place], [place]  [address]    Special Requests   Final    BOTTLES DRAWN AEROBIC AND ANAEROBIC Blood Culture adequate volume Performed at [facility], [address], [place], [place] [address]    Culture  Setup Time   Final    GRAM POSITIVE COCCI IN CLUSTERS Gram Stain Report Called to,Read Back By and Verified With: ROBINSON,J AT 0905 BY HUFFINES,S ON 12/01/17. AEROBIC BOTTLE ONLY Organism ID to follow Performed at [facility], [address], [place], [place] [address]    Culture GRAM POSITIVE COCCI  Final   Report [name] PENDING  Incomplete  Blood Culture ID Panel (Reflexed)     [name]: Abnormal   Collection Time: 11/30/17 12:14 PM  Result Value Ref Range [name]   Enterococcus species NOT DETECTED NOT DETECTED Final   Listeria monocytogenes NOT DETECTED NOT DETECTED Final   Staphylococcus species DETECTED (A) NOT  DETECTED Final    Comment: Methicillin (oxacillin) susceptible coagulase negative staphylococcus. Possible blood culture contaminant (unless isolated from more than one blood culture draw or clinical case suggests pathogenicity). No antibiotic treatment is indicated for blood  culture contaminants. CRITICAL RESULT CALLED TO, READ BACK BY AND VERIFIED WITH: Ermalene Searing PharmD 12:25 12/01/17 ([email or web address])    Staphylococcus aureus NOT DETECTED NOT DETECTED Final   Methicillin resistance NOT DETECTED NOT DETECTED Final   Streptococcus species NOT DETECTED NOT DETECTED Final   Streptococcus agalactiae NOT DETECTED NOT DETECTED Final   Streptococcus pneumoniae NOT DETECTED NOT DETECTED Final   Streptococcus pyogenes NOT DETECTED NOT DETECTED Final   Acinetobacter baumannii NOT DETECTED NOT DETECTED Final   Enterobacteriaceae species NOT DETECTED NOT DETECTED Final   Enterobacter cloacae complex NOT DETECTED NOT DETECTED Final   Escherichia coli NOT DETECTED NOT DETECTED Final   Klebsiella oxytoca NOT DETECTED NOT DETECTED Final   Klebsiella pneumoniae NOT DETECTED NOT DETECTED Final   Proteus species NOT DETECTED NOT DETECTED Final   Serratia marcescens NOT DETECTED NOT DETECTED Final   Haemophilus influenzae NOT DETECTED NOT DETECTED Final   Neisseria meningitidis NOT DETECTED NOT DETECTED Final   Pseudomonas aeruginosa NOT DETECTED NOT DETECTED Final   Candida albicans NOT DETECTED NOT DETECTED Final   Candida glabrata NOT DETECTED NOT DETECTED Final   Candida krusei NOT DETECTED NOT DETECTED Final   Candida parapsilosis NOT DETECTED NOT DETECTED Final   Candida tropicalis NOT DETECTED NOT DETECTED Final    Comment: Performed at [facility], [address], [place], [place] [address]  MRSA PCR Screening     [name]: Abnormal   Collection Time: 11/30/17  6:09 PM  Result Value Ref Range [name]   MRSA by PCR POSITIVE (A) NEGATIVE Final    Comment:        The GeneXpert MRSA Assay  (FDA approved for NASAL specimens only), is one component of a comprehensive MRSA colonization surveillance program. It is not intended to diagnose MRSA infection nor to guide or monitor treatment for MRSA infections. RESULT CALLED TO, READ BACK BY AND VERIFIED WITH: MARTIN,J AT 2259 ON 7.3.2019 BY ISLEY,B Performed at [facility], [address], [place], [place] [address]          Radiology Studies: US Pelvis Limited (transabdominal Only)  Result Date: 11/30/2017 CLINICAL DATA:  Suprapubic catheter, pyuria question abscess at catheter insertion site EXAM: LIMITED ULTRASOUND OF PELVIS TECHNIQUE: Limited transabdominal ultrasound examination of the pelvis was performed. COMPARISON:  None FINDINGS: [name] decompressed by suprapubic catheter. Soft tissues adjacent to the catheter track are  sonographically unremarkable. No abnormal fluid collection is seen to suggest pelvic wall abscess at catheter insertion site. IMPRESSION: Negative exam. Electronically Signed   By: Ulyses Southward M.D.   On: 11/30/2017 17:02   Dg Chest Portable 1 View  Result Date: 11/30/2017 CLINICAL DATA:  Weakness, cough [name].  Nonsmoker. EXAM: PORTABLE CHEST 1 VIEW COMPARISON:  None. FINDINGS: Normal cardiomediastinal silhouette. BILATERAL pulmonary opacities are increased, suspect mild vascular congestion; borderline pulmonary edema not excluded, versus viral pneumonitis. No definite consolidation, effusion, or pneumothorax. Bones unremarkable. IMPRESSION: Worsening aeration. BILATERAL pulmonary opacities, could represent early pulmonary edema or viral pneumonitis. Electronically Signed   By: Elsie Stain M.D.   On: 11/30/2017 12:29        Scheduled Meds: . Chlorhexidine Gluconate Cloth  6 each Topical Q0600  . enoxaparin (LOVENOX) injection  40 mg Subcutaneous Q24H  . fluticasone  1 spray Each Nare BID  . insulin aspart  0-5 Units Subcutaneous QHS  . insulin aspart  0-9 Units Subcutaneous TID WC  .  insulin glargine  5 Units Subcutaneous QHS  . levothyroxine  50 mcg Oral QAC breakfast  . mirtazapine  15 mg Oral Daily  . mupirocin ointment  1 application Nasal BID  . oxybutynin  5 mg Oral QHS  . tamsulosin  0.4 mg Oral Daily   Continuous Infusions: . piperacillin-tazobactam (ZOSYN)  IV 3.375 g (12/01/17 1035)  . vancomycin Stopped (12/01/17 0211)     LOS: 1 day    Time spent: 30 minutes    Zianna Dercole Hoover Brunette, DO [facility] Pager [phone]  If 7PM-7AM, please contact night-coverage [email or web address] Password [email or web address] 12/01/2017, 3:21 PM

## 2017-12-02 LAB — RESPIRATORY PANEL BY PCR
ADENOVIRUS-RVPPCR: NOT DETECTED
Bordetella pertussis: NOT DETECTED
CORONAVIRUS HKU1-RVPPCR: NOT DETECTED
CORONAVIRUS NL63-RVPPCR: NOT DETECTED
CORONAVIRUS OC43-RVPPCR: NOT DETECTED
Chlamydophila pneumoniae: NOT DETECTED
Coronavirus 229E: NOT DETECTED
Influenza A: NOT DETECTED
Influenza B: NOT DETECTED
METAPNEUMOVIRUS-RVPPCR: NOT DETECTED
Mycoplasma pneumoniae: NOT DETECTED
PARAINFLUENZA VIRUS 1-RVPPCR: NOT DETECTED
PARAINFLUENZA VIRUS 2-RVPPCR: NOT DETECTED
PARAINFLUENZA VIRUS 3-RVPPCR: NOT DETECTED
PARAINFLUENZA VIRUS 4-RVPPCR: NOT DETECTED
RHINOVIRUS / ENTEROVIRUS - RVPPCR: NOT DETECTED
Respiratory Syncytial Virus: NOT DETECTED

## 2017-12-02 LAB — GLUCOSE, CAPILLARY
GLUCOSE-CAPILLARY: 209 mg/dL — AB (ref 70–99)
Glucose-Capillary: 169 mg/dL — ABNORMAL HIGH (ref 70–99)
Glucose-Capillary: 182 mg/dL — ABNORMAL HIGH (ref 70–99)

## 2017-12-02 LAB — PROCALCITONIN: Procalcitonin: 0.84 ng/mL

## 2017-12-02 NOTE — Progress Notes (Signed)
PROGRESS NOTE    [name]  WUJ:[id number] DOB: 1956/03/23 DOA: 11/30/2017 PCP: Bernerd Limbo, MD   Brief Narrative:   [name] is a 62 y.o. male with medical history significant for neuromuscular degenerative disorder with bedbound status, mental retardation, chronic suprapubic catheter secondary to neurogenic bladder, type 2 diabetes, hypothyroidism, chronic cough with aspiration hypertension, and hypothyroidism who was recently been treated for catheter associated urinary tract infection with exchange of his suprapubic catheter by urology approximately 1 week ago.    He has been admitted with generalized weakness and poor oral intake. He has had improving alertness throughout the course of this admission.    Assessment & Plan:   Principal Problem:   Acute encephalopathy Active Problems:   Lower urinary tract infectious disease   Neurogenic bladder   Pneumonia   Dysphagia   Protein calorie malnutrition (HCC)   Encephalopathy   Pressure injury of skin   Functional quadriplegia (HCC)   Goals of care, counseling/discussion   DNR (do not resuscitate) discussion   Palliative care by specialist   1. Acute encephalopathy with generalized weakness with baseline MR.  This is secondary to poor oral intake and some dehydration with infections as noted below.  Restart IV fluid hydration and monitor with neurochecks.  Patient is essentially bedbound and will maintain on fall precautions. Palliative Care involved in discussions with half-sister who is currently hospitalized and is legal guardian in bed 326. Plans to continue current level of care. 2. UTI with Proteus in setting of chronic suprapubic catheter with chronic pyuria.  Patient has had recent suprapubic exchange 1 week ago due to infection with neurogenic bladder and has completed course of oral abx per Urology.  He continues to have ongoing minimal drainage of purulent material which appears to be chronic.  Bladder  scan negative for pocket of infection.  He is also noted to have 1 culture positive with methicillin sensitive coagulase-negative staph which is likely a contaminant. Continue on Zosyn for now until final sensitivities return on urine culture. No need to exchange catheter as this has been recently done and concern for biofilm formation is low. 3. Bilateral pulmonary opacities.  Respiratory viral panel negative. I do not believe this is sign of edema as patient appears generally dry.  Patient has minimal symptomatology related to this.  There may also be some component of silent aspiration that is ongoing.  Swallow evaluation completed with recommendations for dysphagia 2 diet at this time. Patient will require assisted feedings which have been started today and will need to continue at [facility].  4. Protein calorie malnutrition.  Patient overall has a dismal prognosis and family/friends at bedside are not interested in any aggressive measures to include tube feedings. Appreciate further dietary recommendations for supplementation now that he is on dysphagia 2 diet. 5. Diabetes with hyperglycemia-improved.  Maintain on half of Lantus dose with sliding scale insulin coverage. 6. Hypothyroidism.  Maintain on Synthroid.  TSH 2.091. 7. Neurogenic bladder.  Maintain on Detrol and Flomax. Continue suprapubic catheter.  8. Hypertension.    Soft blood pressure readings have now improved.  Continue to withhold Lasix and home labetalol restarted on 7/4. 9. Neurodegenerative process-stable.   DVT prophylaxis:Lovenox Code Status: Full Family Communication: Sister who is legal guardian in room 326 Disposition Plan: Continue on IV Zosyn and follow urine sensitivities. May be candidate for DC on oral crushed abx treatment in next 24-48 hours back to SNF as he is near baseline.   Consultants:  Palliative Care  Procedures:   None  Antimicrobials:   Vancomycin 7/3->7/4  Zosyn 7/3->   Subjective: Patient  seen and evaluated today with no new acute complaints or concerns. No acute concerns or events noted overnight. He cannot verbalize what is going on, but appears more alert today.  Objective: Vitals:   12/01/17 2120 12/01/17 2151 12/02/17 0500 12/02/17 1024  BP:  126/74 134/76 127/80  Pulse:  (!) 113 86 85  Resp:  20 17   Temp:  (!) 97.5 F (36.4 C) (!) 97.3 F (36.3 C)   TempSrc:  Oral Oral   SpO2: 99% 97% 98%   Weight:      Height:        Intake/Output Summary (Last 24 hours) at 12/02/2017 1209 Last data filed at 12/02/2017 0900 Gross per 24 hour  Intake 960 ml  Output 450 ml  Net 510 ml   Filed Weights   11/30/17 1709  Weight: 61.1 kg (134 lb 11.2 oz)    Examination:  General exam: Appears calm and comfortable; laying on side with contractures Respiratory system: Clear to auscultation. Respiratory effort normal. Cardiovascular system: S1 & S2 heard, RRR. No JVD, murmurs, rubs, gallops or clicks. No pedal edema. Gastrointestinal system: Abdomen is nondistended, soft and nontender. No organomegaly or masses felt. Normal bowel sounds heard. Central nervous system: Cannot be assessed Skin: No rashes, lesions or ulcers GU: Suprapubic catheter with some purulent drainage noted; UO yellow and cloudy ENT: Poor dentition    Data Reviewed: I have personally reviewed following labs and imaging studies  CBC: Recent Labs  Lab 11/30/17 1154 12/01/17 0352  WBC 10.7* 9.3  NEUTROABS 8.6*  --   HGB 11.1* 10.6*  HCT 35.7* 34.8*  MCV 95.2 97.2  PLT 273 254   Basic Metabolic Panel: Recent Labs  Lab 11/30/17 1154 12/01/17 0352  NA 139 143  K 4.2 3.3*  CL 105 111  CO2 25 25  GLUCOSE 185* 117*  BUN 26* 18  CREATININE 1.18 0.89  CALCIUM 8.6* 8.2*   GFR: Estimated Creatinine Clearance: 75.3 mL/min (by C-G formula based on SCr of 0.89 mg/dL). Liver Function Tests: Recent Labs  Lab 11/30/17 1154  AST 27  ALT 15  ALKPHOS 82  BILITOT 0.6  PROT 7.1  ALBUMIN 2.6*    No results for input(s): LIPASE, AMYLASE in the last 168 hours. No results for input(s): AMMONIA in the last 168 hours. Coagulation Profile: No results for input(s): INR, PROTIME in the last 168 hours. Cardiac Enzymes: Recent Labs  Lab 11/30/17 1200 11/30/17 1646 11/30/17 2148 12/01/17 0352  TROPONINI 0.03* <0.03 0.03* 0.03*   BNP (last 3 results) No results for input(s): PROBNP in the last 8760 hours. HbA1C: No results for input(s): HGBA1C in the last 72 hours. CBG: Recent Labs  Lab 12/01/17 0735 12/01/17 1107 12/01/17 1657 12/01/17 2148 12/02/17 0742  GLUCAP 114* 181* 116* 248* 169*   Lipid Profile: No results for input(s): CHOL, HDL, LDLCALC, TRIG, CHOLHDL, LDLDIRECT in the last 72 hours. Thyroid Function Tests: Recent Labs    11/30/17 1246  TSH 2.091   Anemia Panel: No results for input(s): VITAMINB12, FOLATE, FERRITIN, TIBC, IRON, RETICCTPCT in the last 72 hours. Sepsis Labs: Recent Labs  Lab 11/30/17 1208 11/30/17 1646 12/01/17 0352 12/02/17 0650  PROCALCITON  --  0.94 0.75 0.84  LATICACIDVEN 1.57  --  1.3  --     Recent Results (from the past 240 hour(s))  Urine culture  Status: Abnormal (Preliminary result)   Collection Time: 11/30/17 12:00 PM  Result Value Ref Range Status   Specimen Description   Final    URINE, CLEAN CATCH Performed at [facility], [address] [place], [place] [address]    Special Requests   Final    NONE Performed at [facility], [address], [place], [place] [address]    Culture (A)  Final    >=100,000 COLONIES/mL GRAM NEGATIVE RODS 30,000 COLONIES/mL PROTEUS MIRABILIS    Report Status PENDING  Incomplete  Culture, blood (routine x 2)     Status: None (Preliminary result)   Collection Time: 11/30/17 12:14 PM  Result Value Ref Range Status   Specimen Description BLOOD RIGHT ARM  Final   Special Requests   Final    BOTTLES DRAWN AEROBIC AND ANAEROBIC Blood Culture adequate volume   Culture   Final    NO  GROWTH 2 DAYS Performed at [facility], [address] [place], [place] [address]    Report Status PENDING  Incomplete  Culture, blood (routine x 2)     Status: None (Preliminary result)   Collection Time: 11/30/17 12:14 PM  Result Value Ref Range Status   Specimen Description   Final    BLOOD RIGHT HAND Performed at [facility], [address] [place], [place] [address]    Special Requests   Final    BOTTLES DRAWN AEROBIC AND ANAEROBIC Blood Culture adequate volume Performed at [facility], [address], [place], [place] [address]    Culture  Setup Time   Final    GRAM POSITIVE COCCI IN CLUSTERS Gram Stain Report Called to,Read Back By and Verified With: ROBINSON,J AT 0905 BY HUFFINES,S ON 12/01/17. AEROBIC BOTTLE ONLY    Culture   Final    GRAM POSITIVE COCCI IDENTIFICATION TO FOLLOW Performed at [facility], [address], [place], [place] [address]    Report Status PENDING  Incomplete  Blood Culture ID Panel (Reflexed)     Status: Abnormal   Collection Time: 11/30/17 12:14 PM  Result Value Ref Range Status   Enterococcus species NOT DETECTED NOT DETECTED Final   Listeria monocytogenes NOT DETECTED NOT DETECTED Final   Staphylococcus species DETECTED (A) NOT DETECTED Final    Comment: Methicillin (oxacillin) susceptible coagulase negative staphylococcus. Possible blood culture contaminant (unless isolated from more than one blood culture draw or clinical case suggests pathogenicity). No antibiotic treatment is indicated for blood  culture contaminants. CRITICAL RESULT CALLED TO, READ BACK BY AND VERIFIED WITH: Ermalene Searing PharmD 12:25 12/01/17 ([email or web address])    Staphylococcus aureus NOT DETECTED NOT DETECTED Final   Methicillin resistance NOT DETECTED NOT DETECTED Final   Streptococcus species NOT DETECTED NOT DETECTED Final   Streptococcus agalactiae NOT DETECTED NOT DETECTED Final   Streptococcus pneumoniae NOT DETECTED NOT DETECTED Final   Streptococcus pyogenes NOT  DETECTED NOT DETECTED Final   Acinetobacter baumannii NOT DETECTED NOT DETECTED Final   Enterobacteriaceae species NOT DETECTED NOT DETECTED Final   Enterobacter cloacae complex NOT DETECTED NOT DETECTED Final   Escherichia coli NOT DETECTED NOT DETECTED Final   Klebsiella oxytoca NOT DETECTED NOT DETECTED Final   Klebsiella pneumoniae NOT DETECTED NOT DETECTED Final   Proteus species NOT DETECTED NOT DETECTED Final   Serratia marcescens NOT DETECTED NOT DETECTED Final   Haemophilus influenzae NOT DETECTED NOT DETECTED Final   Neisseria meningitidis NOT DETECTED NOT DETECTED Final   Pseudomonas aeruginosa NOT DETECTED NOT DETECTED Final  Candida albicans NOT DETECTED NOT DETECTED Final   Candida glabrata NOT DETECTED NOT DETECTED Final   Candida krusei NOT DETECTED NOT DETECTED Final   Candida parapsilosis NOT DETECTED NOT DETECTED Final   Candida tropicalis NOT DETECTED NOT DETECTED Final    Comment: Performed at [facility], [address], [place], [place] [address]  MRSA PCR Screening     Status: Abnormal   Collection Time: 11/30/17  6:09 PM  Result Value Ref Range Status   MRSA by PCR POSITIVE (A) NEGATIVE Final    Comment:        The GeneXpert MRSA Assay (FDA approved for NASAL specimens only), is one component of a comprehensive MRSA colonization surveillance program. It is not intended to diagnose MRSA infection nor to guide or monitor treatment for MRSA infections. RESULT CALLED TO, READ BACK BY AND VERIFIED WITH: MARTIN,J AT 2259 ON 7.3.2019 BY ISLEY,B Performed at [facility], [address], [place], [place] [address]   Respiratory Panel by PCR     Status: None   Collection Time: 11/30/17 11:30 PM  Result Value Ref Range Status   Adenovirus NOT DETECTED NOT DETECTED Final   Coronavirus 229E NOT DETECTED NOT DETECTED Final   Coronavirus HKU1 NOT DETECTED NOT DETECTED Final   Coronavirus NL63 NOT DETECTED NOT DETECTED Final   Coronavirus OC43 NOT DETECTED  NOT DETECTED Final   Metapneumovirus NOT DETECTED NOT DETECTED Final   Rhinovirus / Enterovirus NOT DETECTED NOT DETECTED Final   Influenza A NOT DETECTED NOT DETECTED Final   Influenza B NOT DETECTED NOT DETECTED Final   Parainfluenza Virus 1 NOT DETECTED NOT DETECTED Final   Parainfluenza Virus 2 NOT DETECTED NOT DETECTED Final   Parainfluenza Virus 3 NOT DETECTED NOT DETECTED Final   Parainfluenza Virus 4 NOT DETECTED NOT DETECTED Final   Respiratory Syncytial Virus NOT DETECTED NOT DETECTED Final   Bordetella pertussis NOT DETECTED NOT DETECTED Final   Chlamydophila pneumoniae NOT DETECTED NOT DETECTED Final   Mycoplasma pneumoniae NOT DETECTED NOT DETECTED Final         Radiology Studies: US Pelvis Limited (transabdominal Only)  Result Date: 11/30/2017 CLINICAL DATA:  Suprapubic catheter, pyuria question abscess at catheter insertion site EXAM: LIMITED ULTRASOUND OF PELVIS TECHNIQUE: Limited transabdominal ultrasound examination of the pelvis was performed. COMPARISON:  None FINDINGS: Bladder decompressed by suprapubic catheter. Soft tissues adjacent to the catheter track are sonographically unremarkable. No abnormal fluid collection is seen to suggest pelvic wall abscess at catheter insertion site. IMPRESSION: Negative exam. Electronically Signed   By: Ulyses Southward M.D.   On: 11/30/2017 17:02   Dg Chest Portable 1 View  Result Date: 11/30/2017 CLINICAL DATA:  Weakness, cough diabetes.  Nonsmoker. EXAM: PORTABLE CHEST 1 VIEW COMPARISON:  None. FINDINGS: Normal cardiomediastinal silhouette. BILATERAL pulmonary opacities are increased, suspect mild vascular congestion; borderline pulmonary edema not excluded, versus viral pneumonitis. No definite consolidation, effusion, or pneumothorax. Bones unremarkable. IMPRESSION: Worsening aeration. BILATERAL pulmonary opacities, could represent early pulmonary edema or viral pneumonitis. Electronically Signed   By: Elsie Stain M.D.   On: 11/30/2017  12:29        Scheduled Meds: . Chlorhexidine Gluconate Cloth  6 each Topical Q0600  . enoxaparin (LOVENOX) injection  40 mg Subcutaneous Q24H  . fluticasone  1 spray Each Nare BID  . insulin aspart  0-5 Units Subcutaneous QHS  . insulin aspart  0-9 Units Subcutaneous TID WC  . insulin glargine  5 Units Subcutaneous QHS  .  labetalol  300 mg Oral BID  . levothyroxine  50 mcg Oral QAC breakfast  . mirtazapine  15 mg Oral Daily  . mupirocin ointment  1 application Nasal BID  . oxybutynin  5 mg Oral QHS  . tamsulosin  0.4 mg Oral Daily   Continuous Infusions: . piperacillin-tazobactam (ZOSYN)  IV 3.375 g (12/02/17 1023)     LOS: 2 days    Time spent: 30 minutes    Dominica Kent Hoover Brunette, DO [facility] Pager [phone]  If 7PM-7AM, please contact night-coverage [email or web address] Password [email or web address] 12/02/2017, 12:09 PM

## 2017-12-02 NOTE — Clinical Social Work Note (Signed)
LCSW following. Per MD, [facility] Eunice BlaseDebbie asks to be called at [phone]. Will ask weekend CSW to assist with pt's dc.

## 2017-12-02 NOTE — Consult Note (Signed)
WOC nurse consulted for drainage from the SP tube. Noted to appear chronic.  I have updated orders for care.  If concerned for infection would consult urologist.   Re consult if needed, will not follow at this time. Thanks  Shauntay Brunelli [employer] MSN, RN,CWOCN, CNS, CWON-AP [phone])

## 2017-12-02 NOTE — [place] FL2 (Signed)
[place] MEDICAID FL2 LEVEL OF CARE SCREENING TOOL     IDENTIFICATION  Patient Name: [name] Birthdate: 1955/09/23 Sex: male Admission Date (Current Location): 11/30/2017  [facility] and [place] Number:  [employer] and Address:  [facility],  [address] [place] [address]      Provider Number: [id number]  Attending Physician Name and Address:  Erick Blinks, DO  Relative Name and Phone Number:       Current Level of Care: Hospital Recommended Level of Care: Skilled Nursing Facility Prior Approval Number:    Date Approved/Denied:   PASRR Number:    Discharge Plan: SNF    Current Diagnoses: Patient Active Problem List   Diagnosis Date Noted  . Pressure injury of skin 12/01/2017  . Functional quadriplegia (HCC)   . Goals of care, counseling/discussion   . DNR (do not resuscitate) discussion   . Palliative care by specialist   . Pneumonia 11/30/2017  . Dysphagia 11/30/2017  . Acute encephalopathy 11/30/2017  . Protein calorie malnutrition (HCC) 11/30/2017  . Encephalopathy 11/30/2017  . Neurogenic bladder 08/02/2016  . Sepsis (HCC) 11/02/2015  . UTI (urinary tract infection) 11/02/2015  . Sinus tachycardia 11/02/2015  . Dehydration 11/02/2015  . Leukocytosis 11/02/2015  . DM type 2 causing eye disease, not at goal [facility]) 11/02/2015  . Hypokalemia 03/04/2014  . Bacteremia due to Gram-positive bacteria 06/03/2011  . Hypernatremia 06/03/2011  . Hyperkalemia 05/29/2011  . DKA (diabetic ketoacidoses) (HCC) 05/29/2011  . Renal failure 05/29/2011  . Lower urinary tract infectious disease 05/29/2011    Orientation RESPIRATION BLADDER Height & Weight     Self    Incontinent Weight: 134 lb 11.2 oz (61.1 kg) Height:  5\' 9"  (175.3 cm)  BEHAVIORAL SYMPTOMS/MOOD NEUROLOGICAL BOWEL NUTRITION STATUS      Incontinent Diet(see dc summary)  AMBULATORY STATUS COMMUNICATION OF NEEDS Skin   Total Care   PU Stage and Appropriate Care(see dc  summary)                       Personal Care Assistance Level of Assistance  Total care Bathing Assistance: Maximum assistance     Total Care Assistance: Maximum assistance   Functional Limitations Info             SPECIAL CARE FACTORS FREQUENCY                       Contractures Contractures Info: Not present    Additional Factors Info  Code Status, Allergies, Psychotropic Code Status Info: full Allergies Info: No Known Allergies Psychotropic Info: Remeron         Current Medications (12/02/2017):  This is the current hospital active medication list Current Facility-Administered Medications  Medication Dose Route Frequency Provider Last Rate Last Dose  . acetaminophen (TYLENOL) tablet 650 mg  650 mg Oral Q6H PRN Sherryll Burger, Pratik D, DO       Or  . acetaminophen (TYLENOL) suppository 650 mg  650 mg Rectal Q6H PRN Sherryll Burger, Pratik D, DO      . benzonatate (TESSALON) capsule 100 mg  100 mg Oral Q8H PRN Sherryll Burger, Pratik D, DO      . Chlorhexidine Gluconate Cloth 2 % PADS 6 each  6 each Topical Q0600 Maurilio Lovely D, DO   6 each at 12/02/17 0604  . enoxaparin (LOVENOX) injection 40 mg  40 mg Subcutaneous Q24H Sherryll Burger, Pratik D, DO   40 mg at 12/01/17 2202  .  fluticasone (FLONASE) 50 MCG/ACT nasal spray 1 spray  1 spray Each Nare BID Sherryll Burger, Pratik D, DO   1 spray at 12/02/17 1024  . insulin aspart (novoLOG) injection 0-5 Units  0-5 Units Subcutaneous QHS Maurilio Lovely D, DO   2 Units at 12/01/17 2217  . insulin aspart (novoLOG) injection 0-9 Units  0-9 Units Subcutaneous TID WC Sherryll Burger, Pratik D, DO   2 Units at 12/02/17 0759  . insulin glargine (LANTUS) injection 5 Units  5 Units Subcutaneous QHS Sherryll Burger, Pratik D, DO   5 Units at 12/01/17 2211  . labetalol (NORMODYNE) tablet 300 mg  300 mg Oral BID Maurilio Lovely D, DO   300 mg at 12/02/17 1024  . levothyroxine (SYNTHROID, LEVOTHROID) tablet 50 mcg  50 mcg Oral QAC breakfast Maurilio Lovely D, DO   50 mcg at 12/02/17 0759  . mirtazapine  (REMERON) tablet 15 mg  15 mg Oral Daily Sherryll Burger, Pratik D, DO   15 mg at 12/02/17 1025  . mupirocin ointment (BACTROBAN) 2 % 1 application  1 application Nasal BID Maurilio Lovely D, DO   1 application at 12/02/17 1025  . ondansetron (ZOFRAN) tablet 4 mg  4 mg Oral Q6H PRN Sherryll Burger, Pratik D, DO       Or  . ondansetron (ZOFRAN) injection 4 mg  4 mg Intravenous Q6H PRN Sherryll Burger, Pratik D, DO      . oxybutynin (DITROPAN-XL) 24 hr tablet 5 mg  5 mg Oral QHS Shah, Pratik D, DO   5 mg at 12/01/17 2206  . piperacillin-tazobactam (ZOSYN) IVPB 3.375 g  3.375 g Intravenous Q8H Shah, Pratik D, DO 12.5 mL/hr at 12/02/17 1023 3.375 g at 12/02/17 1023  . tamsulosin (FLOMAX) capsule 0.4 mg  0.4 mg Oral Daily Sherryll Burger, Pratik D, DO   0.4 mg at 12/01/17 1748     Discharge Medications: Please see discharge summary for a list of discharge medications.  Relevant Imaging Results:  Relevant Lab Results:   Additional Information    Elliot Gault, LCSW

## 2017-12-03 DIAGNOSIS — N390 Urinary tract infection, site not specified: Secondary | ICD-10-CM

## 2017-12-03 DIAGNOSIS — E46 Unspecified protein-calorie malnutrition: Secondary | ICD-10-CM

## 2017-12-03 DIAGNOSIS — N319 Neuromuscular dysfunction of bladder, unspecified: Secondary | ICD-10-CM

## 2017-12-03 DIAGNOSIS — G9340 Encephalopathy, unspecified: Secondary | ICD-10-CM

## 2017-12-03 DIAGNOSIS — R1310 Dysphagia, unspecified: Secondary | ICD-10-CM

## 2017-12-03 LAB — GLUCOSE, CAPILLARY
GLUCOSE-CAPILLARY: 144 mg/dL — AB (ref 70–99)
GLUCOSE-CAPILLARY: 140 mg/dL — AB (ref 70–99)
GLUCOSE-CAPILLARY: 243 mg/dL — AB (ref 70–99)
Glucose-Capillary: 143 mg/dL — ABNORMAL HIGH (ref 70–99)
Glucose-Capillary: 227 mg/dL — ABNORMAL HIGH (ref 70–99)

## 2017-12-03 LAB — URINE CULTURE: Culture: >=100000 — AB

## 2017-12-03 LAB — CBC
HEMATOCRIT: 32.5 % — AB (ref 39.0–52.0)
Hemoglobin: 10.4 g/dL — ABNORMAL LOW (ref 13.0–17.0)
MCH: 30.3 pg (ref 26.0–34.0)
MCHC: 32 g/dL (ref 30.0–36.0)
MCV: 94.8 fL (ref 78.0–100.0)
PLATELETS: 260 10*3/uL (ref 150–400)
RBC: 3.43 MIL/uL — AB (ref 4.22–5.81)
RDW: 12.6 % (ref 11.5–15.5)
WBC: 10.2 10*3/uL (ref 4.0–10.5)

## 2017-12-03 LAB — BASIC METABOLIC PANEL
ANION GAP: 7 (ref 5–15)
BUN: 11 mg/dL (ref 8–23)
CO2: 28 mmol/L (ref 22–32)
Calcium: 8.2 mg/dL — ABNORMAL LOW (ref 8.9–10.3)
Chloride: 105 mmol/L (ref 98–111)
Creatinine, Ser: 0.87 mg/dL (ref 0.61–1.24)
GFR calc Af Amer: >60 mL/min (ref >60)
GLUCOSE: 145 mg/dL — AB (ref 70–99)
POTASSIUM: 3.1 mmol/L — AB (ref 3.5–5.1)
Sodium: 140 mmol/L (ref 135–145)

## 2017-12-03 LAB — CULTURE, BLOOD (ROUTINE X 2): Special Requests: ADEQUATE

## 2017-12-03 LAB — MAGNESIUM: Magnesium: 2 mg/dL (ref 1.7–2.4)

## 2017-12-03 MED ORDER — POTASSIUM CHLORIDE 10 MEQ/100ML IV SOLN
10.0000 meq | INTRAVENOUS | Status: AC
  Administered 2017-12-03 (×2): 10 meq via INTRAVENOUS
  Filled 2017-12-03 (×3): qty 100

## 2017-12-03 MED ORDER — SULFAMETHOXAZOLE-TRIMETHOPRIM 800-160 MG PO TABS
1.0000 | ORAL_TABLET | Freq: Two times a day (BID) | ORAL | Status: DC
  Administered 2017-12-03 – 2017-12-04 (×2): 1 via ORAL
  Filled 2017-12-03 (×2): qty 1

## 2017-12-03 NOTE — Progress Notes (Signed)
PROGRESS NOTE    Jacob GavelGene R Houchen  EAV:[id number] DOB: 1955/08/29 DOA: 11/30/2017 PCP: Bernerd LimboAriza, Fernando Enrique, MD   Brief Narrative:   [name] is a 62 y.o. male with medical history significant for neuromuscular degenerative disorder with bedbound status, mental retardation, chronic suprapubic catheter secondary to neurogenic bladder, type 2 diabetes, hypothyroidism, chronic cough with aspiration hypertension, and hypothyroidism who was recently been treated for catheter associated urinary tract infection with exchange of his suprapubic catheter by urology approximately 1 week ago.    He has been admitted with generalized weakness and poor oral intake. He has had improving alertness throughout the course of this admission.    Assessment & Plan:   Principal Problem:   Acute encephalopathy Active Problems:   Lower urinary tract infectious disease   Neurogenic bladder   Pneumonia   Dysphagia   Protein calorie malnutrition (HCC)   Encephalopathy   Pressure injury of skin   Functional quadriplegia (HCC)   Goals of care, counseling/discussion   DNR (do not resuscitate) discussion   Palliative care by specialist   1. Acute encephalopathy with generalized weakness with baseline MR.  This is secondary to poor oral intake and some dehydration with infections as noted below.  Patient was treated with IV fluids.  Patient is essentially bedbound and will maintain on fall precautions. Palliative Care involved in discussions with half-sister who is currently hospitalized and is legal guardian in bed [address]. Plans to continue current level of care.  Mental status appears to be improving and approaching baseline 2. UTI with Proteus in setting of chronic suprapubic catheter with chronic pyuria.  Patient has had recent suprapubic exchange 1 week ago due to infection with neurogenic bladder and has completed course of oral abx per Urology.  He continues to have ongoing minimal drainage of purulent  material which appears to be chronic.  Bladder scan negative for pocket of infection.  He is also noted to have 1 culture positive with methicillin sensitive coagulase-negative staph which is likely a contaminant.  Based on culture sensitivities, will transition Zosyn to Bactrim.  No need to exchange catheter as this has been recently done and concern for biofilm formation is low. 3. Bilateral pulmonary opacities.  Respiratory viral panel negative. I do not believe this is sign of edema as patient appears generally dry.  Patient has minimal symptomatology related to this.  There may also be some component of silent aspiration that is ongoing.  Swallow evaluation completed with recommendations for dysphagia 2 diet at this time. Patient will require assisted feed[facility] which have been started [facility]  4. Protein calorie malnutrition.  Patient overall has a dismal prognosis and family/friends at bedside are not interested in any aggressive measures to include tube feedings. Appreciate further dietary recommendations for supplementation now that he is on dysphagia 2 diet. 5. Diabetes with hyperglycemia-improved.  Maintain on half of Lantus dose with sliding scale insulin coverage. 6. Hypothyroidism.  Maintain on Synthroid.  TSH 2.091. 7. Neurogenic bladder.  Maintain on Detrol and Flomax. Continue suprapubic catheter.  8. Hypertension.    Soft blood pressure readings have now improved.  Continue to withhold Lasix and home labetalol restarted on 7/4. 9. Neurodegenerative process-stable.   DVT prophylaxis:Lovenox Code Status: Full Family Communication: Sister who is legal guardian in room [address] Disposition Plan: Likely discharge back to nursing facility in the next 24 hours.   Consultants:   Palliative Care  Procedures:   None  Antimicrobials:  Vancomycin 7/3->7/4  Zosyn 7/3-> 7/6  Bactrim 7/6 >   Subjective: Patient laying in bed, awake, denies any  complaints.  Objective: Vitals:   12/02/17 2159 12/02/17 2200 12/03/17 0448 12/03/17 1405  BP: (!) 146/104 (!) 150/92 (!) 126/91 123/73  Pulse: (!) 111 100 96 99  Resp: 16  17 17   Temp: 98.7 F (37.1 C)  97.8 F (36.6 C) 98.6 F (37 C)  TempSrc: Oral  Oral Oral  SpO2: 96% 99% 99% 100%  Weight:      Height:        Intake/Output Summary (Last 24 hours) at 12/03/2017 1807 Last data filed at 12/03/2017 1300 Gross per 24 hour  Intake 846.04 ml  Output 1350 ml  Net -503.96 ml   Filed Weights   11/30/17 1709  Weight: 61.1 kg (134 lb 11.2 oz)    Examination:  General exam: Alert, awake, no distress Respiratory system: Clear to auscultation. Respiratory effort normal. Cardiovascular system:RRR. No murmurs, rubs, gallops. Gastrointestinal system: Abdomen is nondistended, soft and nontender. No organomegaly or masses felt. Normal bowel sounds heard. Central nervous system:  No focal neurological deficits. Extremities: No C/C/E, +pedal pulses Skin: No rashes, lesions or ulcers Psychiatry: Answers only yes and no due to mental retardation  Data Reviewed: I have personally reviewed following labs and imaging studies  CBC: Recent Labs  Lab 11/30/17 1154 12/01/17 0352 12/03/17 0853  WBC 10.7* 9.3 10.2  NEUTROABS 8.6*  --   --   HGB 11.1* 10.6* 10.4*  HCT 35.7* 34.8* 32.5*  MCV 95.2 97.2 94.8  PLT 273 254 260   Basic Metabolic Panel: Recent Labs  Lab 11/30/17 1154 12/01/17 0352 12/03/17 0853  NA 139 143 140  K 4.2 3.3* 3.1*  CL 105 111 105  CO2 25 25 28   GLUCOSE 185* 117* 145*  BUN 26* 18 11  CREATININE 1.18 0.89 0.87  CALCIUM 8.6* 8.2* 8.2*  MG  --   --  2.0   GFR: Estimated Creatinine Clearance: 77.1 mL/min (by C-G formula based on SCr of 0.87 mg/dL). Liver Function Tests: Recent Labs  Lab 11/30/17 1154  AST 27  ALT 15  ALKPHOS 82  BILITOT 0.6  PROT 7.1  ALBUMIN 2.6*   No results for input(s): LIPASE, AMYLASE in the last 168 hours. No results for  input(s): AMMONIA in the last 168 hours. Coagulation Profile: No results for input(s): INR, PROTIME in the last 168 hours. Cardiac Enzymes: Recent Labs  Lab 11/30/17 1200 11/30/17 1646 11/30/17 2148 12/01/17 0352  TROPONINI 0.03* <0.03 0.03* 0.03*   BNP (last 3 results) No results for input(s): PROBNP in the last 8760 hours. HbA1C: No results for input(s): HGBA1C in the last 72 hours. CBG: Recent Labs  Lab 12/02/17 1605 12/03/17 0408 12/03/17 0736 12/03/17 1102 12/03/17 1601  GLUCAP 209* 144* 140* 143* 227*   Lipid Profile: No results for input(s): CHOL, HDL, LDLCALC, TRIG, CHOLHDL, LDLDIRECT in the last 72 hours. Thyroid Function Tests: No results for input(s): TSH, T4TOTAL, FREET4, T3FREE, THYROIDAB in the last 72 hours. Anemia Panel: No results for input(s): VITAMINB12, FOLATE, FERRITIN, TIBC, IRON, RETICCTPCT in the last 72 hours. Sepsis Labs: Recent Labs  Lab 11/30/17 1208 11/30/17 1646 12/01/17 0352 12/02/17 0650  PROCALCITON  --  0.94 0.75 0.84  LATICACIDVEN 1.57  --  1.3  --     Recent Results (from the past 240 hour(s))  Urine culture     Status: Abnormal (Preliminary result)   Collection Time: 11/30/17 12:00  PM  Result Value Ref Range Status   Specimen Description   Final    URINE, CLEAN CATCH Performed at [facility], [address], [place], [place] [address]    Special Requests   Final    NONE Performed at [facility], [address] [place], [place] [address]    Culture (A)  Final    >=100,000 COLONIES/mL ESCHERICHIA COLI 30,000 COLONIES/mL PROTEUS MIRABILIS PROTEUS MIRABILIS    Report Status PENDING  Incomplete   Organism ID, Bacteria PROTEUS MIRABILIS (A)  Final   Organism ID, Bacteria ESCHERICHIA COLI (A)  Final   Organism ID, Bacteria PROTEUS MIRABILIS  Final      Susceptibility   Escherichia coli - MIC*    AMPICILLIN >=32 RESISTANT Resistant     CEFAZOLIN >=64 RESISTANT Resistant     CEFTRIAXONE >=64 RESISTANT Resistant      CIPROFLOXACIN >=4 RESISTANT Resistant     GENTAMICIN <=1 SENSITIVE Sensitive     IMIPENEM <=0.25 SENSITIVE Sensitive     NITROFURANTOIN <=16 SENSITIVE Sensitive     TRIMETH/SULFA <=20 SENSITIVE Sensitive     AMPICILLIN/SULBACTAM >=32 RESISTANT Resistant     PIP/TAZO 8 SENSITIVE Sensitive     Extended ESBL POSITIVE Resistant     * >=100,000 COLONIES/mL ESCHERICHIA COLI   Proteus mirabilis - MIC*    AMPICILLIN 4 SENSITIVE Sensitive     CEFAZOLIN <=4 SENSITIVE Sensitive     CEFTRIAXONE <=1 SENSITIVE Sensitive     CIPROFLOXACIN >=4 RESISTANT Resistant     GENTAMICIN <=1 SENSITIVE Sensitive     IMIPENEM <=0.25 SENSITIVE Sensitive     NITROFURANTOIN 128 RESISTANT Resistant     TRIMETH/SULFA <=20 SENSITIVE Sensitive     AMPICILLIN/SULBACTAM <=2 SENSITIVE Sensitive     PIP/TAZO <=4 SENSITIVE Sensitive    Proteus mirabilis - MIC*    AMPICILLIN 4 SENSITIVE Sensitive     CEFAZOLIN <=4 SENSITIVE Sensitive     CEFTRIAXONE <=1 SENSITIVE Sensitive     CIPROFLOXACIN >=4 RESISTANT Resistant     GENTAMICIN <=1 SENSITIVE Sensitive     IMIPENEM <=0.25 SENSITIVE Sensitive     NITROFURANTOIN 128 RESISTANT Resistant     TRIMETH/SULFA <=20 SENSITIVE Sensitive     AMPICILLIN/SULBACTAM <=2 SENSITIVE Sensitive     PIP/TAZO <=4 SENSITIVE Sensitive     * 30,000 COLONIES/mL PROTEUS MIRABILIS    PROTEUS MIRABILIS  Culture, blood (routine x 2)     Status: None (Preliminary result)   Collection Time: 11/30/17 12:14 PM  Result Value Ref Range Status   Specimen Description BLOOD RIGHT ARM  Final   Special Requests   Final    BOTTLES DRAWN AEROBIC AND ANAEROBIC Blood Culture adequate volume   Culture   Final    NO GROWTH 3 DAYS Performed at [facility], [address], [place], [place] [address]    Report Status PENDING  Incomplete  Culture, blood (routine x 2)     Status: Abnormal   Collection Time: 11/30/17 12:14 PM  Result Value Ref Range Status   Specimen Description   Final    BLOOD RIGHT  HAND Performed at [facility], [address], [place], [place] [address]    Special Requests   Final    BOTTLES DRAWN AEROBIC AND ANAEROBIC Blood Culture adequate volume Performed at [facility], [address] [place], [place] [address]    Culture  Setup Time   Final    GRAM POSITIVE COCCI IN CLUSTERS Gram Stain Report Called to,Read Back By and Verified  With: ROBINSON,J AT 0905 BY HUFFINES,S ON 12/01/17. AEROBIC BOTTLE ONLY    Culture (A)  Final    STAPHYLOCOCCUS SPECIES (COAGULASE NEGATIVE) THE SIGNIFICANCE OF ISOLATING THIS ORGANISM FROM A SINGLE SET OF BLOOD CULTURES WHEN MULTIPLE SETS ARE DRAWN IS UNCERTAIN. PLEASE NOTIFY THE MICROBIOLOGY DEPARTMENT WITHIN ONE WEEK IF SPECIATION AND SENSITIVITIES ARE REQUIRED. Performed at [facility], [address], [place], [place] [address]    Report Status 12/03/2017 FINAL  Final  Blood Culture ID Panel (Reflexed)     Status: Abnormal   Collection Time: 11/30/17 12:14 PM  Result Value Ref Range Status   Enterococcus species NOT DETECTED NOT DETECTED Final   Listeria monocytogenes NOT DETECTED NOT DETECTED Final   Staphylococcus species DETECTED (A) NOT DETECTED Final    Comment: Methicillin (oxacillin) susceptible coagulase negative staphylococcus. Possible blood culture contaminant (unless isolated from more than one blood culture draw or clinical case suggests pathogenicity). No antibiotic treatment is indicated for blood  culture contaminants. CRITICAL RESULT CALLED TO, READ BACK BY AND VERIFIED WITH: Ermalene Searing PharmD 12:25 12/01/17 ([email or web address])    Staphylococcus aureus NOT DETECTED NOT DETECTED Final   Methicillin resistance NOT DETECTED NOT DETECTED Final   Streptococcus species NOT DETECTED NOT DETECTED Final   Streptococcus agalactiae NOT DETECTED NOT DETECTED Final   Streptococcus pneumoniae NOT DETECTED NOT DETECTED Final   Streptococcus pyogenes NOT DETECTED NOT DETECTED Final   Acinetobacter baumannii NOT DETECTED NOT  DETECTED Final   Enterobacteriaceae species NOT DETECTED NOT DETECTED Final   Enterobacter cloacae complex NOT DETECTED NOT DETECTED Final   Escherichia coli NOT DETECTED NOT DETECTED Final   Klebsiella oxytoca NOT DETECTED NOT DETECTED Final   Klebsiella pneumoniae NOT DETECTED NOT DETECTED Final   Proteus species NOT DETECTED NOT DETECTED Final   Serratia marcescens NOT DETECTED NOT DETECTED Final   Haemophilus influenzae NOT DETECTED NOT DETECTED Final   Neisseria meningitidis NOT DETECTED NOT DETECTED Final   Pseudomonas aeruginosa NOT DETECTED NOT DETECTED Final   Candida albicans NOT DETECTED NOT DETECTED Final   Candida glabrata NOT DETECTED NOT DETECTED Final   Candida krusei NOT DETECTED NOT DETECTED Final   Candida parapsilosis NOT DETECTED NOT DETECTED Final   Candida tropicalis NOT DETECTED NOT DETECTED Final    Comment: Performed at [facility], [address], [place], [place] [address]  MRSA PCR Screening     Status: Abnormal   Collection Time: 11/30/17  6:09 PM  Result Value Ref Range Status   MRSA by PCR POSITIVE (A) NEGATIVE Final    Comment:        The GeneXpert MRSA Assay (FDA approved for NASAL specimens only), is one component of a comprehensive MRSA colonization surveillance program. It is not intended to diagnose MRSA infection nor to guide or monitor treatment for MRSA infections. RESULT CALLED TO, READ BACK BY AND VERIFIED WITH: MARTIN,J AT 2259 ON 7.3.2019 BY ISLEY,B Performed at [facility], [address], [place], [place] [address]   Respiratory Panel by PCR     Status: None   Collection Time: 11/30/17 11:30 PM  Result Value Ref Range Status   Adenovirus NOT DETECTED NOT DETECTED Final   Coronavirus 229E NOT DETECTED NOT DETECTED Final   Coronavirus HKU1 NOT DETECTED NOT DETECTED Final   Coronavirus NL63 NOT DETECTED NOT DETECTED Final   Coronavirus OC43 NOT DETECTED NOT DETECTED Final   Metapneumovirus NOT DETECTED NOT DETECTED Final    Rhinovirus / Enterovirus NOT DETECTED NOT DETECTED  Final   Influenza A NOT DETECTED NOT DETECTED Final   Influenza B NOT DETECTED NOT DETECTED Final   Parainfluenza Virus 1 NOT DETECTED NOT DETECTED Final   Parainfluenza Virus 2 NOT DETECTED NOT DETECTED Final   Parainfluenza Virus 3 NOT DETECTED NOT DETECTED Final   Parainfluenza Virus 4 NOT DETECTED NOT DETECTED Final   Respiratory Syncytial Virus NOT DETECTED NOT DETECTED Final   Bordetella pertussis NOT DETECTED NOT DETECTED Final   Chlamydophila pneumoniae NOT DETECTED NOT DETECTED Final   Mycoplasma pneumoniae NOT DETECTED NOT DETECTED Final         Radiology Studies: No results found.      Scheduled Meds: . Chlorhexidine Gluconate Cloth  6 each Topical Q0600  . enoxaparin (LOVENOX) injection  40 mg Subcutaneous Q24H  . fluticasone  1 spray Each Nare BID  . insulin aspart  0-5 Units Subcutaneous QHS  . insulin aspart  0-9 Units Subcutaneous TID WC  . insulin glargine  5 Units Subcutaneous QHS  . labetalol  300 mg Oral BID  . levothyroxine  50 mcg Oral QAC breakfast  . mirtazapine  15 mg Oral Daily  . mupirocin ointment  1 application Nasal BID  . oxybutynin  5 mg Oral QHS  . tamsulosin  0.4 mg Oral Daily   Continuous Infusions: . piperacillin-tazobactam (ZOSYN)  IV 3.375 g (12/03/17 1634)     LOS: 3 days    Time spent: 30 minutes    Erick Blinks, MD [facility] Pager [phone]  If 7PM-7AM, please contact night-coverage [email or web address] Password [email or web address] 12/03/2017, 6:07 PM

## 2017-12-04 DIAGNOSIS — R532 Functional quadriplegia: Secondary | ICD-10-CM

## 2017-12-04 LAB — BASIC METABOLIC PANEL
ANION GAP: 7 (ref 5–15)
BUN: 9 mg/dL (ref 8–23)
CALCIUM: 8.1 mg/dL — AB (ref 8.9–10.3)
CHLORIDE: 106 mmol/L (ref 98–111)
CO2: 26 mmol/L (ref 22–32)
Creatinine, Ser: 0.84 mg/dL (ref 0.61–1.24)
GFR calc non Af Amer: >60 mL/min (ref >60)
Glucose, Bld: 232 mg/dL — ABNORMAL HIGH (ref 70–99)
POTASSIUM: 3.3 mmol/L — AB (ref 3.5–5.1)
Sodium: 139 mmol/L (ref 135–145)

## 2017-12-04 LAB — GLUCOSE, CAPILLARY
Glucose-Capillary: 157 mg/dL — ABNORMAL HIGH (ref 70–99)
Glucose-Capillary: 162 mg/dL — ABNORMAL HIGH (ref 70–99)
Glucose-Capillary: 189 mg/dL — ABNORMAL HIGH (ref 70–99)
Glucose-Capillary: 216 mg/dL — ABNORMAL HIGH (ref 70–99)
Glucose-Capillary: 242 mg/dL — ABNORMAL HIGH (ref 70–99)

## 2017-12-04 LAB — MAGNESIUM: Magnesium: 2 mg/dL (ref 1.7–2.4)

## 2017-12-04 MED ORDER — OXYBUTYNIN CHLORIDE ER 5 MG PO TB24: 5.0000 mg | ORAL_TABLET | Freq: Every day | ORAL | Status: AC

## 2017-12-04 MED ORDER — SULFAMETHOXAZOLE-TRIMETHOPRIM 800-160 MG PO TABS: 1.0000 | ORAL_TABLET | Freq: Two times a day (BID) | ORAL | 0 refills | Status: AC

## 2017-12-04 MED ORDER — TAMSULOSIN HCL 0.4 MG PO CAPS: 0.4000 mg | ORAL_CAPSULE | Freq: Every day | ORAL | Status: AC

## 2017-12-04 NOTE — Progress Notes (Signed)
Report called to Mellon FinancialShaiterria, [facility]. Waiting for [facility] to arrive for transportation.

## 2017-12-04 NOTE — Progress Notes (Signed)
Jacob GavelGene R [name] discharged to [name] per MD order.  Copy of instructions sent with patient. Report called previously to Jacob.  Allergies as of 12/04/2017   No Known Allergies     Medication List    STOP taking these medications   tolterodine 1 MG tablet Commonly known as:  DETROL Replaced by:  oxybutynin 5 MG 24 hr tablet     TAKE these medications   acetaminophen 650 MG CR tablet Commonly known as:  TYLENOL Take 650 mg by mouth every 8 (eight) hours as needed for pain. What changed:  Another medication with the same name was removed. Continue taking this medication, and follow the directions you see here.   acetic acid 0.25 % irrigation Irrigate with 1 application as directed 2 (two) times daily. Irrigate catheter every 12 hours   Antacid & Antigas 200-200-20 MG/5ML Susp Take 30 mLs by mouth daily as needed (heartburn).   furosemide 20 MG tablet Commonly known as:  LASIX Take 20 mg by mouth daily.   insulin aspart 100 UNIT/ML injection Commonly known as:  novoLOG Inject 2-10 Units into the skin 3 (three) times daily before meals. If 151-200 = 2 units, notify NP/MD if <60,   201-250 = 4 units, 251-300 = 6 units, 301-350 = 8 units, 351-400 = 10 units Notify MD/NP if > 400   insulin glargine 100 UNIT/ML injection Commonly known as:  LANTUS Inject 0.1 mLs (10 Units total) into the skin at bedtime. What changed:    how much to take  when to take this   ipratropium 0.06 % nasal spray Commonly known as:  ATROVENT Place 2 sprays into both nostrils 2 (two) times daily as needed for rhinitis.   labetalol 300 MG tablet Commonly known as:  NORMODYNE Take 300 mg by mouth 2 (two) times daily. Hold if systolic BP is less than 100   levothyroxine 50 MCG tablet Commonly known as:  SYNTHROID, LEVOTHROID Take 50 mcg by mouth daily before breakfast.   magnesium hydroxide 400 MG/5ML suspension Commonly known as:  MILK OF MAGNESIA Take 30 mLs by mouth daily as needed for  mild constipation.   mirtazapine 15 MG tablet Commonly known as:  REMERON Take 15 mg by mouth daily. For weight loss.   oxybutynin 5 MG 24 hr tablet Commonly known as:  DITROPAN-XL Take 1 tablet (5 mg total) by mouth at bedtime. Replaces:  tolterodine 1 MG tablet   potassium chloride SA 20 MEQ tablet Commonly known as:  K-DUR,KLOR-CON Take 20 mEq by mouth daily.   sulfamethoxazole-trimethoprim 800-160 MG tablet Commonly known as:  BACTRIM DS,SEPTRA DS Take 1 tablet by mouth every 12 (twelve) hours for 5 days.   tamsulosin 0.4 MG Caps capsule Commonly known as:  FLOMAX Take 1 capsule (0.4 mg total) by mouth daily. Hold if systolic BP is less than 100       IV site discontinued and catheter remains intact. Site without signs and symptoms of complications. Dressing and pressure applied.  Pt transported via [facility].  [name] 12/04/2017 5:00 PM

## 2017-12-04 NOTE — Progress Notes (Signed)
Clinical Social Worker facilitated patient discharge including contacting patient family and facility to confirm patient discharge plans.  Clinical information faxed to facility and family agreeable with plan.  RN arranged ambulance transport via EMS to .  RN to call for report prior to discharge [phone] Rm D17  Clinical Social Worker will sign off for now as social work intervention is no longer needed. Please consult us again if new need arises.  Mahamad Lenia Housley LCSWA [phone]

## 2017-12-04 NOTE — Discharge Summary (Addendum)
Physician Discharge Summary  [name] ZOX:[id number] DOB: Oct 12, 1955 DOA: 11/30/2017  PCP: Bernerd Limbo, MD  Admit date: 11/30/2017 Discharge date: 12/04/2017  Admitted From: SNF Disposition:  SNF  Recommendations for Outpatient Follow-up:  1. Follow up with PCP in 1-2 weeks 2. Please obtain BMP/CBC in one week 3. Recommend continued discussions around code status and goals of care  Discharge Condition: stable CODE STATUS: full code Diet recommendation: dysphagia 2 with thin liquids  Brief/Interim Summary: 62 y/o male with history of neuromuscular degenerative disorder with bedbound status, mental retardation, neurogenic bladder s/p suprapubic catheter, DM2, hypothyroidism who resides at SNF, was recently treated for catheter associated UTI with exchange of his catheter by urology, approximately 1 week prior to admission. He presented to the hospital with generalized weakness and poor po intake. He was lethargic, dehydrated and was found to have UTI  Discharge Diagnoses:  Principal Problem:   Acute encephalopathy Active Problems:   Lower urinary tract infectious disease   Neurogenic bladder   Pneumonia   Dysphagia   Protein calorie malnutrition (HCC)   Encephalopathy   Pressure injury of skin   Functional quadriplegia (HCC)   Goals of care, counseling/discussion   DNR (do not resuscitate) discussion   Palliative care by specialist  1. Acute encephalopathy with generalized weakness with baseline mental retardation. This was secondary to poor oral intake and some dehydration with infections as noted below.  Patient was treated with IV fluids. Mental status appears to be improving and approaching baseline 2. UTI with Proteus and E coli in setting of chronic suprapubic catheter with chronic pyuria. Patient has had recent suprapubic exchange 1 week prior to admission due to infection with neurogenic bladder and has completed course of oral abx per Urology. He continued  to have ongoing minimal drainage of purulent material which appears to be chronic. Bladder scan negative for pocket of infection.  He was also noted to have 1 blood culture positive with methicillin sensitive coagulase-negative staph which was likely a contaminant. Urine culture positive for E coli and Proteus. Based on culture sensitivities, Zosyn de escalated to bactrim to complete course.  No need to exchange catheter as this has been recently done and concern for biofilm formation is low. 3. Bilateral pulmonary opacities. Respiratory viral panel negative. Unlikely to be pulmonary edema since patient appeared generally dry. Patient had minimal symptomatology related to this. There may also be some component of silent aspiration that is ongoing.  Swallow evaluation completed with recommendations for dysphagia 2 diet at this time. Patient will require assisted feedings which have been started and will need to continue at [facility]. Repeat chest xray in 3-4 weeks. 4. Protein calorie malnutrition. Patient overall has a dismal prognosis. Nutrition was following patient 5. Diabetes with hyperglycemia-improved. resume home dose of insulin on discharge 6. Hypothyroidism. Maintain on Synthroid. TSH 2.091. 7. Neurogenic bladder. Maintain on Detrol and Flomax. Continue suprapubic catheter.  8. Hypertension.   Soft blood pressure readings have now improved. Resume outpatient antihypertensives on discharge 9. Neurodegenerative process-stable. 10. Pressure injury.  Stage II sacral ulcer, present on admission, treated supportively 11. Goals of care. Seen by palliative care. Conversations around goals of care and code status were had with patient's sister who is his POA. She wants to continue with all available treatments at this time and wishes to continue full code status. I would encourage continued conversations around code status as an outpatient. DNR would be very appropriate for this patient.   Discharge  Instructions  Discharge Instructions    Diet - low sodium heart healthy   Complete by:  As directed    Increase activity slowly   Complete by:  As directed      Allergies as of 12/04/2017   No Known Allergies     Medication List    STOP taking these medications   tolterodine 1 MG tablet Commonly known as:  DETROL Replaced by:  oxybutynin 5 MG 24 hr tablet     TAKE these medications   acetaminophen 650 MG CR tablet Commonly known as:  TYLENOL Take 650 mg by mouth every 8 (eight) hours as needed for pain. What changed:  Another medication with the same name was removed. Continue taking this medication, and follow the directions you see here.   acetic acid 0.25 % irrigation Irrigate with 1 application as directed 2 (two) times daily. Irrigate catheter every 12 hours   Antacid & Antigas 200-200-20 MG/5ML Susp Take 30 mLs by mouth daily as needed (heartburn).   furosemide 20 MG tablet Commonly known as:  LASIX Take 20 mg by mouth daily.   insulin aspart 100 UNIT/ML injection Commonly known as:  novoLOG Inject 2-10 Units into the skin 3 (three) times daily before meals. If 151-200 = 2 units, notify NP/MD if <60,   201-250 = 4 units, 251-300 = 6 units, 301-350 = 8 units, 351-400 = 10 units Notify MD/NP if > 400   insulin glargine 100 UNIT/ML injection Commonly known as:  LANTUS Inject 0.1 mLs (10 Units total) into the skin at bedtime. What changed:    how much to take  when to take this   ipratropium 0.06 % nasal spray Commonly known as:  ATROVENT Place 2 sprays into both nostrils 2 (two) times daily as needed for rhinitis.   labetalol 300 MG tablet Commonly known as:  NORMODYNE Take 300 mg by mouth 2 (two) times daily. Hold if systolic BP is less than 100   levothyroxine 50 MCG tablet Commonly known as:  SYNTHROID, LEVOTHROID Take 50 mcg by mouth daily before breakfast.   magnesium hydroxide 400 MG/5ML suspension Commonly known as:  MILK OF MAGNESIA Take 30 mLs  by mouth daily as needed for mild constipation.   mirtazapine 15 MG tablet Commonly known as:  REMERON Take 15 mg by mouth daily. For weight loss.   oxybutynin 5 MG 24 hr tablet Commonly known as:  DITROPAN-XL Take 1 tablet (5 mg total) by mouth at bedtime. Replaces:  tolterodine 1 MG tablet   potassium chloride SA 20 MEQ tablet Commonly known as:  K-DUR,KLOR-CON Take 20 mEq by mouth daily.   sulfamethoxazole-trimethoprim 800-160 MG tablet Commonly known as:  BACTRIM DS,SEPTRA DS Take 1 tablet by mouth every 12 (twelve) hours for 5 days.   tamsulosin 0.4 MG Caps capsule Commonly known as:  FLOMAX Take 1 capsule (0.4 mg total) by mouth daily. Hold if systolic BP is less than 100       No Known Allergies  Consultations:  Palliative care   Procedures/Studies: US Pelvis Limited (transabdominal Only)  Result Date: 11/30/2017 CLINICAL DATA:  Suprapubic catheter, pyuria question abscess at catheter insertion site EXAM: LIMITED ULTRASOUND OF PELVIS TECHNIQUE: Limited transabdominal ultrasound examination of the pelvis was performed. COMPARISON:  None FINDINGS: Bladder decompressed by suprapubic catheter. Soft tissues adjacent to the catheter track are sonographically unremarkable. No abnormal fluid collection is seen to suggest pelvic wall abscess at catheter insertion site. IMPRESSION: Negative exam. Electronically Signed   By: Loraine Leriche  Tyron Russell M.D.   On: 11/30/2017 17:02   Dg Chest Portable 1 View  Result Date: 11/30/2017 CLINICAL DATA:  Weakness, cough diabetes.  Nonsmoker. EXAM: PORTABLE CHEST 1 VIEW COMPARISON:  None. FINDINGS: Normal cardiomediastinal silhouette. BILATERAL pulmonary opacities are increased, suspect mild vascular congestion; borderline pulmonary edema not excluded, versus viral pneumonitis. No definite consolidation, effusion, or pneumothorax. Bones unremarkable. IMPRESSION: Worsening aeration. BILATERAL pulmonary opacities, could represent early pulmonary edema or  viral pneumonitis. Electronically Signed   By: Elsie Stain M.D.   On: 11/30/2017 12:29       Subjective: Denies any pain. History limited by mental status  Discharge Exam: Vitals:   12/03/17 2117 12/04/17 0544  BP: (!) 145/87 133/81  Pulse: 88 85  Resp: 16 16  Temp: 99.6 F (37.6 C) 97.7 F (36.5 C)  SpO2:  100%   Vitals:   12/03/17 0448 12/03/17 1405 12/03/17 2117 12/04/17 0544  BP: (!) 126/91 123/73 (!) 145/87 133/81  Pulse: 96 99 88 85  Resp: 17 17 16 16   Temp: 97.8 F (36.6 C) 98.6 F (37 C) 99.6 F (37.6 C) 97.7 F (36.5 C)  TempSrc: Oral Oral Oral Oral  SpO2: 99% 100%  100%  Weight:      Height:        General: Pt is alert, awake, not in acute distress Cardiovascular: RRR, S1/S2 +, no rubs, no gallops Respiratory: CTA bilaterally, no wheezing, no rhonchi Abdominal: Soft, NT, ND, bowel sounds + Extremities: no edema, no cyanosis    The results of significant diagnostics from this hospitalization (including imaging, microbiology, ancillary and laboratory) are listed below for reference.     Microbiology: Recent Results (from the past 240 hour(s))  Urine culture     Status: Abnormal (Preliminary result)   Collection Time: 11/30/17 12:00 PM  Result Value Ref Range Status   Specimen Description   Final    URINE, CLEAN CATCH Performed at [facility], [address] [place], [place] [address]    Special Requests   Final    NONE Performed at [facility], [address], [place], [place] [address]    Culture (A)  Final    >=100,000 COLONIES/mL ESCHERICHIA COLI 30,000 COLONIES/mL PROTEUS MIRABILIS PROTEUS MIRABILIS    Report Status PENDING  Incomplete   Organism ID, Bacteria PROTEUS MIRABILIS (A)  Final   Organism ID, Bacteria ESCHERICHIA COLI (A)  Final   Organism ID, Bacteria PROTEUS MIRABILIS  Final      Susceptibility   Escherichia coli - MIC*    AMPICILLIN >=32 RESISTANT Resistant     CEFAZOLIN >=64 RESISTANT Resistant     CEFTRIAXONE >=64  RESISTANT Resistant     CIPROFLOXACIN >=4 RESISTANT Resistant     GENTAMICIN <=1 SENSITIVE Sensitive     IMIPENEM <=0.25 SENSITIVE Sensitive     NITROFURANTOIN <=16 SENSITIVE Sensitive     TRIMETH/SULFA <=20 SENSITIVE Sensitive     AMPICILLIN/SULBACTAM >=32 RESISTANT Resistant     PIP/TAZO 8 SENSITIVE Sensitive     Extended ESBL POSITIVE Resistant     * >=100,000 COLONIES/mL ESCHERICHIA COLI   Proteus mirabilis - MIC*    AMPICILLIN 4 SENSITIVE Sensitive     CEFAZOLIN <=4 SENSITIVE Sensitive     CEFTRIAXONE <=1 SENSITIVE Sensitive     CIPROFLOXACIN >=4 RESISTANT Resistant     GENTAMICIN <=1 SENSITIVE Sensitive     IMIPENEM <=0.25 SENSITIVE Sensitive     NITROFURANTOIN 128 RESISTANT Resistant     TRIMETH/SULFA <=20 SENSITIVE Sensitive  AMPICILLIN/SULBACTAM <=2 SENSITIVE Sensitive     PIP/TAZO <=4 SENSITIVE Sensitive    Proteus mirabilis - MIC*    AMPICILLIN 4 SENSITIVE Sensitive     CEFAZOLIN <=4 SENSITIVE Sensitive     CEFTRIAXONE <=1 SENSITIVE Sensitive     CIPROFLOXACIN >=4 RESISTANT Resistant     GENTAMICIN <=1 SENSITIVE Sensitive     IMIPENEM <=0.25 SENSITIVE Sensitive     NITROFURANTOIN 128 RESISTANT Resistant     TRIMETH/SULFA <=20 SENSITIVE Sensitive     AMPICILLIN/SULBACTAM <=2 SENSITIVE Sensitive     PIP/TAZO <=4 SENSITIVE Sensitive     * 30,000 COLONIES/mL PROTEUS MIRABILIS    PROTEUS MIRABILIS  Culture, blood (routine x 2)     Status: None (Preliminary result)   Collection Time: 11/30/17 12:14 PM  Result Value Ref Range Status   Specimen Description BLOOD RIGHT ARM  Final   Special Requests   Final    BOTTLES DRAWN AEROBIC AND ANAEROBIC Blood Culture adequate volume   Culture   Final    NO GROWTH 4 DAYS Performed at [facility], [address] [place], [place] [address]    Report Status PENDING  Incomplete  Culture, blood (routine x 2)     Status: Abnormal   Collection Time: 11/30/17 12:14 PM  Result Value Ref Range Status   Specimen Description   Final     BLOOD RIGHT HAND Performed at [facility], [address] [place], [place] [address]    Special Requests   Final    BOTTLES DRAWN AEROBIC AND ANAEROBIC Blood Culture adequate volume Performed at [facility], [address], [place], [place] [address]    Culture  Setup Time   Final    GRAM POSITIVE COCCI IN CLUSTERS Gram Stain Report Called to,Read Back By and Verified With: ROBINSON,J AT 0905 BY HUFFINES,S ON 12/01/17. AEROBIC BOTTLE ONLY    Culture (A)  Final    STAPHYLOCOCCUS SPECIES (COAGULASE NEGATIVE) THE SIGNIFICANCE OF ISOLATING THIS ORGANISM FROM A SINGLE SET OF BLOOD CULTURES WHEN MULTIPLE SETS ARE DRAWN IS UNCERTAIN. PLEASE NOTIFY THE MICROBIOLOGY DEPARTMENT WITHIN ONE WEEK IF SPECIATION AND SENSITIVITIES ARE REQUIRED. Performed at [facility], [address] [place], [place] [address]    Report Status 12/03/2017 FINAL  Final  Blood Culture ID Panel (Reflexed)     Status: Abnormal   Collection Time: 11/30/17 12:14 PM  Result Value Ref Range Status   Enterococcus species NOT DETECTED NOT DETECTED Final   Listeria monocytogenes NOT DETECTED NOT DETECTED Final   Staphylococcus species DETECTED (A) NOT DETECTED Final    Comment: Methicillin (oxacillin) susceptible coagulase negative staphylococcus. Possible blood culture contaminant (unless isolated from more than one blood culture draw or clinical case suggests pathogenicity). No antibiotic treatment is indicated for blood  culture contaminants. CRITICAL RESULT CALLED TO, READ BACK BY AND VERIFIED WITH: Ermalene Searing PharmD 12:25 12/01/17 ([email or web address])    Staphylococcus aureus NOT DETECTED NOT DETECTED Final   Methicillin resistance NOT DETECTED NOT DETECTED Final   Streptococcus species NOT DETECTED NOT DETECTED Final   Streptococcus agalactiae NOT DETECTED NOT DETECTED Final   Streptococcus pneumoniae NOT DETECTED NOT DETECTED Final   Streptococcus pyogenes NOT DETECTED NOT DETECTED Final   Acinetobacter baumannii NOT  DETECTED NOT DETECTED Final   Enterobacteriaceae species NOT DETECTED NOT DETECTED Final   Enterobacter cloacae complex NOT DETECTED NOT DETECTED Final   Escherichia coli NOT DETECTED NOT DETECTED Final   Klebsiella oxytoca NOT DETECTED NOT DETECTED Final   Klebsiella pneumoniae  NOT DETECTED NOT DETECTED Final   Proteus species NOT DETECTED NOT DETECTED Final   Serratia marcescens NOT DETECTED NOT DETECTED Final   Haemophilus influenzae NOT DETECTED NOT DETECTED Final   Neisseria meningitidis NOT DETECTED NOT DETECTED Final   Pseudomonas aeruginosa NOT DETECTED NOT DETECTED Final   Candida albicans NOT DETECTED NOT DETECTED Final   Candida glabrata NOT DETECTED NOT DETECTED Final   Candida krusei NOT DETECTED NOT DETECTED Final   Candida parapsilosis NOT DETECTED NOT DETECTED Final   Candida tropicalis NOT DETECTED NOT DETECTED Final    Comment: Performed at [facility], [address], [place], [place] [address]  MRSA PCR Screening     Status: Abnormal   Collection Time: 11/30/17  6:09 PM  Result Value Ref Range Status   MRSA by PCR POSITIVE (A) NEGATIVE Final    Comment:        The GeneXpert MRSA Assay (FDA approved for NASAL specimens only), is one component of a comprehensive MRSA colonization surveillance program. It is not intended to diagnose MRSA infection nor to guide or monitor treatment for MRSA infections. RESULT CALLED TO, READ BACK BY AND VERIFIED WITH: MARTIN,J AT 2259 ON 7.3.2019 BY ISLEY,B Performed at [facility], [address] [place], [place] [address]   Respiratory Panel by PCR     Status: None   Collection Time: 11/30/17 11:30 PM  Result Value Ref Range Status   Adenovirus NOT DETECTED NOT DETECTED Final   Coronavirus 229E NOT DETECTED NOT DETECTED Final   Coronavirus HKU1 NOT DETECTED NOT DETECTED Final   Coronavirus NL63 NOT DETECTED NOT DETECTED Final   Coronavirus OC43 NOT DETECTED NOT DETECTED Final   Metapneumovirus NOT DETECTED NOT  DETECTED Final   Rhinovirus / Enterovirus NOT DETECTED NOT DETECTED Final   Influenza A NOT DETECTED NOT DETECTED Final   Influenza B NOT DETECTED NOT DETECTED Final   Parainfluenza Virus 1 NOT DETECTED NOT DETECTED Final   Parainfluenza Virus 2 NOT DETECTED NOT DETECTED Final   Parainfluenza Virus 3 NOT DETECTED NOT DETECTED Final   Parainfluenza Virus 4 NOT DETECTED NOT DETECTED Final   Respiratory Syncytial Virus NOT DETECTED NOT DETECTED Final   Bordetella pertussis NOT DETECTED NOT DETECTED Final   Chlamydophila pneumoniae NOT DETECTED NOT DETECTED Final   Mycoplasma pneumoniae NOT DETECTED NOT DETECTED Final     Labs: BNP (last 3 results) No results for input(s): BNP in the last 8760 hours. Basic Metabolic Panel: Recent Labs  Lab 11/30/17 1154 12/01/17 0352 12/03/17 0853 12/04/17 0908  NA 139 143 140 139  K 4.2 3.3* 3.1* 3.3*  CL 105 111 105 106  CO2 25 25 28 26   GLUCOSE 185* 117* 145* 232*  BUN 26* 18 11 9   CREATININE 1.18 0.89 0.87 0.84  CALCIUM 8.6* 8.2* 8.2* 8.1*  MG  --   --  2.0 2.0   Liver Function Tests: Recent Labs  Lab 11/30/17 1154  AST 27  ALT 15  ALKPHOS 82  BILITOT 0.6  PROT 7.1  ALBUMIN 2.6*   No results for input(s): LIPASE, AMYLASE in the last 168 hours. No results for input(s): AMMONIA in the last 168 hours. CBC: Recent Labs  Lab 11/30/17 1154 12/01/17 0352 12/03/17 0853  WBC 10.7* 9.3 10.2  NEUTROABS 8.6*  --   --   HGB 11.1* 10.6* 10.4*  HCT 35.7* 34.8* 32.5*  MCV 95.2 97.2 94.8  PLT 273 254 260   Cardiac Enzymes: Recent Labs  Lab  11/30/17 1200 11/30/17 1646 11/30/17 2148 12/01/17 0352  TROPONINI 0.03* <0.03 0.03* 0.03*   BNP: Invalid input(s): POCBNP CBG: Recent Labs  Lab 12/03/17 2003 12/04/17 0000 12/04/17 0444 12/04/17 0732 12/04/17 1122  GLUCAP 243* 216* 162* 157* 242*   D-Dimer No results for input(s): DDIMER in the last 72 hours. Hgb A1c No results for input(s): HGBA1C in the last 72 hours. Lipid  Profile No results for input(s): CHOL, HDL, LDLCALC, TRIG, CHOLHDL, LDLDIRECT in the last 72 hours. Thyroid function studies No results for input(s): TSH, T4TOTAL, T3FREE, THYROIDAB in the last 72 hours.  Invalid input(s): FREET3 Anemia work up No results for input(s): VITAMINB12, FOLATE, FERRITIN, TIBC, IRON, RETICCTPCT in the last 72 hours. Urinalysis    Component Value Date/Time   COLORURINE YELLOW 11/30/2017 1159   APPEARANCEUR TURBID (A) 11/30/2017 1159   LABSPEC 1.013 11/30/2017 1159   PHURINE 8.0 11/30/2017 1159   GLUCOSEU NEGATIVE 11/30/2017 1159   HGBUR SMALL (A) 11/30/2017 1159   BILIRUBINUR NEGATIVE 11/30/2017 1159   KETONESUR NEGATIVE 11/30/2017 1159   PROTEINUR 100 (A) 11/30/2017 1159   UROBILINOGEN 0.2 04/15/2014 1946   NITRITE NEGATIVE 11/30/2017 1159   LEUKOCYTESUR MODERATE (A) 11/30/2017 1159   Sepsis Labs Invalid input(s): PROCALCITONIN,  WBC,  LACTICIDVEN Microbiology Recent Results (from the past 240 hour(s))  Urine culture     Status: Abnormal (Preliminary result)   Collection Time: 11/30/17 12:00 PM  Result Value Ref Range Status   Specimen Description   Final    URINE, CLEAN CATCH Performed at [facility], [address] [place], [place] [address]    Special Requests   Final    NONE Performed at [facility], [address], [place], [place] [address]    Culture (A)  Final    >=100,000 COLONIES/mL ESCHERICHIA COLI 30,000 COLONIES/mL PROTEUS MIRABILIS PROTEUS MIRABILIS    Report Status PENDING  Incomplete   Organism ID, Bacteria PROTEUS MIRABILIS (A)  Final   Organism ID, Bacteria ESCHERICHIA COLI (A)  Final   Organism ID, Bacteria PROTEUS MIRABILIS  Final      Susceptibility   Escherichia coli - MIC*    AMPICILLIN >=32 RESISTANT Resistant     CEFAZOLIN >=64 RESISTANT Resistant     CEFTRIAXONE >=64 RESISTANT Resistant     CIPROFLOXACIN >=4 RESISTANT Resistant     GENTAMICIN <=1 SENSITIVE Sensitive     IMIPENEM <=0.25 SENSITIVE Sensitive      NITROFURANTOIN <=16 SENSITIVE Sensitive     TRIMETH/SULFA <=20 SENSITIVE Sensitive     AMPICILLIN/SULBACTAM >=32 RESISTANT Resistant     PIP/TAZO 8 SENSITIVE Sensitive     Extended ESBL POSITIVE Resistant     * >=100,000 COLONIES/mL ESCHERICHIA COLI   Proteus mirabilis - MIC*    AMPICILLIN 4 SENSITIVE Sensitive     CEFAZOLIN <=4 SENSITIVE Sensitive     CEFTRIAXONE <=1 SENSITIVE Sensitive     CIPROFLOXACIN >=4 RESISTANT Resistant     GENTAMICIN <=1 SENSITIVE Sensitive     IMIPENEM <=0.25 SENSITIVE Sensitive     NITROFURANTOIN 128 RESISTANT Resistant     TRIMETH/SULFA <=20 SENSITIVE Sensitive     AMPICILLIN/SULBACTAM <=2 SENSITIVE Sensitive     PIP/TAZO <=4 SENSITIVE Sensitive    Proteus mirabilis - MIC*    AMPICILLIN 4 SENSITIVE Sensitive     CEFAZOLIN <=4 SENSITIVE Sensitive     CEFTRIAXONE <=1 SENSITIVE Sensitive     CIPROFLOXACIN >=4 RESISTANT Resistant     GENTAMICIN <=1 SENSITIVE Sensitive     IMIPENEM <=0.25 SENSITIVE  Sensitive     NITROFURANTOIN 128 RESISTANT Resistant     TRIMETH/SULFA <=20 SENSITIVE Sensitive     AMPICILLIN/SULBACTAM <=2 SENSITIVE Sensitive     PIP/TAZO <=4 SENSITIVE Sensitive     * 30,000 COLONIES/mL PROTEUS MIRABILIS    PROTEUS MIRABILIS  Culture, blood (routine x 2)     Status: None (Preliminary result)   Collection Time: 11/30/17 12:14 PM  Result Value Ref Range Status   Specimen Description BLOOD RIGHT ARM  Final   Special Requests   Final    BOTTLES DRAWN AEROBIC AND ANAEROBIC Blood Culture adequate volume   Culture   Final    NO GROWTH 4 DAYS Performed at [facility], [address] [place], [place] [address]    Report Status PENDING  Incomplete  Culture, blood (routine x 2)     Status: Abnormal   Collection Time: 11/30/17 12:14 PM  Result Value Ref Range Status   Specimen Description   Final    BLOOD RIGHT HAND Performed at [facility], [address] [place], [place] [address]    Special Requests   Final    BOTTLES DRAWN AEROBIC  AND ANAEROBIC Blood Culture adequate volume Performed at [facility], [address] [place], [place] [address]    Culture  Setup Time   Final    GRAM POSITIVE COCCI IN CLUSTERS Gram Stain Report Called to,Read Back By and Verified With: ROBINSON,J AT 0905 BY HUFFINES,S ON 12/01/17. AEROBIC BOTTLE ONLY    Culture (A)  Final    STAPHYLOCOCCUS SPECIES (COAGULASE NEGATIVE) THE SIGNIFICANCE OF ISOLATING THIS ORGANISM FROM A SINGLE SET OF BLOOD CULTURES WHEN MULTIPLE SETS ARE DRAWN IS UNCERTAIN. PLEASE NOTIFY THE MICROBIOLOGY DEPARTMENT WITHIN ONE WEEK IF SPECIATION AND SENSITIVITIES ARE REQUIRED. Performed at [facility], [address] [place], [place] [address]    Report Status 12/03/2017 FINAL  Final  Blood Culture ID Panel (Reflexed)     Status: Abnormal   Collection Time: 11/30/17 12:14 PM  Result Value Ref Range Status   Enterococcus species NOT DETECTED NOT DETECTED Final   Listeria monocytogenes NOT DETECTED NOT DETECTED Final   Staphylococcus species DETECTED (A) NOT DETECTED Final    Comment: Methicillin (oxacillin) susceptible coagulase negative staphylococcus. Possible blood culture contaminant (unless isolated from more than one blood culture draw or clinical case suggests pathogenicity). No antibiotic treatment is indicated for blood  culture contaminants. CRITICAL RESULT CALLED TO, READ BACK BY AND VERIFIED WITH: Ermalene SearingS. Hurth PharmD 12:25 12/01/17 ([email or web address])    Staphylococcus aureus NOT DETECTED NOT DETECTED Final   Methicillin resistance NOT DETECTED NOT DETECTED Final   Streptococcus species NOT DETECTED NOT DETECTED Final   Streptococcus agalactiae NOT DETECTED NOT DETECTED Final   Streptococcus pneumoniae NOT DETECTED NOT DETECTED Final   Streptococcus pyogenes NOT DETECTED NOT DETECTED Final   Acinetobacter baumannii NOT DETECTED NOT DETECTED Final   Enterobacteriaceae species NOT DETECTED NOT DETECTED Final   Enterobacter cloacae complex NOT DETECTED NOT DETECTED  Final   Escherichia coli NOT DETECTED NOT DETECTED Final   Klebsiella oxytoca NOT DETECTED NOT DETECTED Final   Klebsiella pneumoniae NOT DETECTED NOT DETECTED Final   Proteus species NOT DETECTED NOT DETECTED Final   Serratia marcescens NOT DETECTED NOT DETECTED Final   Haemophilus influenzae NOT DETECTED NOT DETECTED Final   Neisseria meningitidis NOT DETECTED NOT DETECTED Final   Pseudomonas aeruginosa NOT DETECTED NOT DETECTED Final   Candida albicans NOT DETECTED NOT DETECTED Final   Candida glabrata NOT DETECTED  NOT DETECTED Final   Candida krusei NOT DETECTED NOT DETECTED Final   Candida parapsilosis NOT DETECTED NOT DETECTED Final   Candida tropicalis NOT DETECTED NOT DETECTED Final    Comment: Performed at [facility], [address] [place], [place] [address]  MRSA PCR Screening     Status: Abnormal   Collection Time: 11/30/17  6:09 PM  Result Value Ref Range Status   MRSA by PCR POSITIVE (A) NEGATIVE Final    Comment:        The GeneXpert MRSA Assay (FDA approved for NASAL specimens only), is one component of a comprehensive MRSA colonization surveillance program. It is not intended to diagnose MRSA infection nor to guide or monitor treatment for MRSA infections. RESULT CALLED TO, READ BACK BY AND VERIFIED WITH: MARTIN,J AT 2259 ON 7.3.2019 BY ISLEY,B Performed at [facility], [address], [place], [place] [address]   Respiratory Panel by PCR     Status: None   Collection Time: 11/30/17 11:30 PM  Result Value Ref Range Status   Adenovirus NOT DETECTED NOT DETECTED Final   Coronavirus 229E NOT DETECTED NOT DETECTED Final   Coronavirus HKU1 NOT DETECTED NOT DETECTED Final   Coronavirus NL63 NOT DETECTED NOT DETECTED Final   Coronavirus OC43 NOT DETECTED NOT DETECTED Final   Metapneumovirus NOT DETECTED NOT DETECTED Final   Rhinovirus / Enterovirus NOT DETECTED NOT DETECTED Final   Influenza A NOT DETECTED NOT DETECTED Final   Influenza B NOT DETECTED  NOT DETECTED Final   Parainfluenza Virus 1 NOT DETECTED NOT DETECTED Final   Parainfluenza Virus 2 NOT DETECTED NOT DETECTED Final   Parainfluenza Virus 3 NOT DETECTED NOT DETECTED Final   Parainfluenza Virus 4 NOT DETECTED NOT DETECTED Final   Respiratory Syncytial Virus NOT DETECTED NOT DETECTED Final   Bordetella pertussis NOT DETECTED NOT DETECTED Final   Chlamydophila pneumoniae NOT DETECTED NOT DETECTED Final   Mycoplasma pneumoniae NOT DETECTED NOT DETECTED Final     Time coordinating discharge:  SIGNED:   Erick Blinks, MD  [facility] 12/04/2017, 12:49 PM Pager   If 7PM-7AM, please contact night-coverage [email or web address] Password [email or web address]

## 2017-12-05 LAB — GLUCOSE, CAPILLARY
GLUCOSE-CAPILLARY: 160 mg/dL — AB (ref 70–99)
Glucose-Capillary: 110 mg/dL — ABNORMAL HIGH (ref 70–99)

## 2017-12-05 LAB — CULTURE, BLOOD (ROUTINE X 2)
CULTURE: NO GROWTH
Special Requests: ADEQUATE

## 2017-12-18 ENCOUNTER — Emergency Department (HOSPITAL_COMMUNITY): Payer: Medicare Other

## 2017-12-18 ENCOUNTER — Encounter (HOSPITAL_COMMUNITY): Payer: Self-pay | Admitting: Emergency Medicine

## 2017-12-18 ENCOUNTER — Other Ambulatory Visit: Payer: Self-pay

## 2017-12-18 ENCOUNTER — Emergency Department (HOSPITAL_COMMUNITY)
Admission: EM | Admit: 2017-12-18 | Discharge: 2017-12-18 | Disposition: A | Discharge status: Home / Self Care | Payer: Medicare Other | Attending: Emergency Medicine | Admitting: Emergency Medicine

## 2017-12-18 DIAGNOSIS — E039 Hypothyroidism, unspecified: Secondary | ICD-10-CM | POA: Diagnosis not present

## 2017-12-18 DIAGNOSIS — R21 Rash and other nonspecific skin eruption: Secondary | ICD-10-CM | POA: Diagnosis present

## 2017-12-18 DIAGNOSIS — E119 Type 2 diabetes mellitus without complications: Secondary | ICD-10-CM | POA: Diagnosis not present

## 2017-12-18 DIAGNOSIS — Z79899 Other long term (current) drug therapy: Secondary | ICD-10-CM | POA: Diagnosis not present

## 2017-12-18 DIAGNOSIS — N200 Calculus of kidney: Secondary | ICD-10-CM | POA: Diagnosis not present

## 2017-12-18 DIAGNOSIS — I509 Heart failure, unspecified: Secondary | ICD-10-CM | POA: Insufficient documentation

## 2017-12-18 DIAGNOSIS — I110 Hypertensive heart disease with heart failure: Secondary | ICD-10-CM | POA: Diagnosis not present

## 2017-12-18 DIAGNOSIS — B379 Candidiasis, unspecified: Secondary | ICD-10-CM | POA: Diagnosis not present

## 2017-12-18 DIAGNOSIS — N3090 Cystitis, unspecified without hematuria: Secondary | ICD-10-CM | POA: Insufficient documentation

## 2017-12-18 DIAGNOSIS — Z794 Long term (current) use of insulin: Secondary | ICD-10-CM | POA: Insufficient documentation

## 2017-12-18 LAB — COMPREHENSIVE METABOLIC PANEL
ALT: 38 U/L (ref 0–44)
ANION GAP: 10 (ref 5–15)
AST: 48 U/L — ABNORMAL HIGH (ref 15–41)
Albumin: 2.6 g/dL — ABNORMAL LOW (ref 3.5–5.0)
Alkaline Phosphatase: 103 U/L (ref 38–126)
BUN: 10 mg/dL (ref 8–23)
CHLORIDE: 100 mmol/L (ref 98–111)
CO2: 28 mmol/L (ref 22–32)
CREATININE: 0.87 mg/dL (ref 0.61–1.24)
Calcium: 8.6 mg/dL — ABNORMAL LOW (ref 8.9–10.3)
Glucose, Bld: 255 mg/dL — ABNORMAL HIGH (ref 70–99)
POTASSIUM: 3.7 mmol/L (ref 3.5–5.1)
Sodium: 138 mmol/L (ref 135–145)
Total Bilirubin: 0.6 mg/dL (ref 0.3–1.2)
Total Protein: 6.9 g/dL (ref 6.5–8.1)

## 2017-12-18 LAB — URINALYSIS, ROUTINE W REFLEX MICROSCOPIC
Bilirubin Urine: NEGATIVE
Bilirubin Urine: NEGATIVE
GLUCOSE, UA: NEGATIVE mg/dL
GLUCOSE, UA: NEGATIVE mg/dL
KETONES UR: NEGATIVE mg/dL
Ketones, ur: NEGATIVE mg/dL
NITRITE: POSITIVE — AB
Nitrite: POSITIVE — AB
PH: 6 (ref 5.0–8.0)
PROTEIN: 30 mg/dL — AB
Protein, ur: 30 mg/dL — AB
Specific Gravity, Urine: 1.012 (ref 1.005–1.030)
Specific Gravity, Urine: 1.012 (ref 1.005–1.030)
WBC, UA: >50 WBC/hpf — ABNORMAL HIGH (ref 0–5)
pH: 6 (ref 5.0–8.0)

## 2017-12-18 LAB — CBC WITH DIFFERENTIAL/PLATELET
Basophils Absolute: 0 10*3/uL (ref 0.0–0.1)
Basophils Relative: 0 %
EOS PCT: 1 %
Eosinophils Absolute: 0.1 10*3/uL (ref 0.0–0.7)
HEMATOCRIT: 35.6 % — AB (ref 39.0–52.0)
Hemoglobin: 11.1 g/dL — ABNORMAL LOW (ref 13.0–17.0)
LYMPHS PCT: 14 %
Lymphs Abs: 1.2 10*3/uL (ref 0.7–4.0)
MCH: 29.9 pg (ref 26.0–34.0)
MCHC: 31.2 g/dL (ref 30.0–36.0)
MCV: 96 fL (ref 78.0–100.0)
Monocytes Absolute: 0.7 10*3/uL (ref 0.1–1.0)
Monocytes Relative: 8 %
NEUTROS ABS: 6.8 10*3/uL (ref 1.7–7.7)
Neutrophils Relative %: 77 %
PLATELETS: 255 10*3/uL (ref 150–400)
RBC: 3.71 MIL/uL — AB (ref 4.22–5.81)
RDW: 13.9 % (ref 11.5–15.5)
WBC: 8.9 10*3/uL (ref 4.0–10.5)

## 2017-12-18 LAB — I-STAT CG4 LACTIC ACID, ED
LACTIC ACID, VENOUS: 1.22 mmol/L (ref 0.5–1.9)
Lactic Acid, Venous: 3.09 mmol/L (ref 0.5–1.9)

## 2017-12-18 MED ORDER — KETOROLAC TROMETHAMINE 30 MG/ML IJ SOLN
15.0000 mg | Freq: Once | INTRAMUSCULAR | Status: AC
  Administered 2017-12-18: 15 mg via INTRAVENOUS
  Filled 2017-12-18: qty 1

## 2017-12-18 MED ORDER — NYSTATIN 100000 UNIT/GM EX POWD: Freq: Two times a day (BID) | CUTANEOUS | 0 refills | Status: AC

## 2017-12-18 MED ORDER — PIPERACILLIN-TAZOBACTAM 3.375 G IVPB 30 MIN
3.3750 g | Freq: Once | INTRAVENOUS | Status: AC
  Administered 2017-12-18: 3.375 g via INTRAVENOUS
  Filled 2017-12-18: qty 50

## 2017-12-18 MED ORDER — MUPIROCIN 2 % EX OINT: 1.0000 "application " | TOPICAL_OINTMENT | Freq: Three times a day (TID) | CUTANEOUS | 0 refills | Status: AC

## 2017-12-18 MED ORDER — HYDROCORTISONE 1 % EX CREA: 1.0000 "application " | TOPICAL_CREAM | Freq: Three times a day (TID) | CUTANEOUS | 0 refills | Status: AC

## 2017-12-18 MED ORDER — BARRIER CREAM NON-SPECIFIED
1.0000 "application " | TOPICAL_CREAM | Freq: Two times a day (BID) | TOPICAL | Status: DC | PRN
  Administered 2017-12-18: 1 via TOPICAL
  Filled 2017-12-18: qty 1

## 2017-12-18 MED ORDER — SODIUM CHLORIDE 0.9 % IV BOLUS
1000.0000 mL | Freq: Once | INTRAVENOUS | Status: AC
  Administered 2017-12-18: 1000 mL via INTRAVENOUS

## 2017-12-18 MED ORDER — SULFAMETHOXAZOLE-TRIMETHOPRIM 800-160 MG PO TABS: 1.0000 | ORAL_TABLET | Freq: Two times a day (BID) | ORAL | 0 refills | Status: AC

## 2017-12-18 NOTE — Discharge Instructions (Signed)
We saw [name] in the ER for concerns for pain. We have observed in the ER for greater than 6 hours. We have ordered basic labs, including urine analysis and also done x-ray of the chest and CT scan of the abdomen. We noticed multiple small kidney stones which are not problematic, and signs of urinary tract infection.  We suspect that [name] is having pain because of his UTI and also because of the Candida infection in his groin.  -Please give the antibiotics as prescribed. -Please monitor him closely for fevers, mental status changes, vomiting and get vital signs 3 times a day at the minimum for the next 2 or 3 days. -Please take care of the infection in the groin with the medications prescribed. -Have the urologist see him in the clinic in 1 week. -Please bring [name] back to the emergency room if there is worsening of his symptoms, he has high fevers, nausea and vomiting, confusion, low blood pressure or consistently high heart rate over 120.

## 2017-12-18 NOTE — ED Provider Notes (Signed)
[facility] EMERGENCY DEPARTMENT Provider Note   CSN: [id number] Arrival date & time: 12/18/17  1357     History   Chief Complaint Chief Complaint  Patient presents with  . Rash    HPI [name] is a 62 y.o. male.  HPI  Level 5 caveat for cognitive delay.  62 y/o male with history of neuromuscular degenerative disorder with bedbound status, mental retardation, neurogenic bladder s/p chronic indwelling catheter, DM2 comes in from the skilled nursing facility with chief complaint of pain  Patient's family are at bedside.  According to the skilled nursing facility, patient has been yelling all day and scratching himself, which is why they sent him to the ER.  Family reports that patient looks uncomfortable to them, however is not able to tell him where the pain is.  Patient was recently admitted to the hospital for altered mental status and was noted to have UTI.  He has known history of multiple UTIs, and at last admission he required IV Zosyn followed by Bactrim.  Of note, patient has large rash in his groin.  Family unaware when the rash began.  Past Medical History:  Diagnosis Date  . Arthritis   . Bladder mass   . BPH (benign prostatic hyperplasia)   . CHF (congestive heart failure) (HCC)   . Chronic cough   . Cognitive communication deficit   . Complication of anesthesia    agitaton after anesthesia  . Diabetes mellitus    type2  . DKA (diabetic ketoacidoses) (HCC)   . Dysphagia   . Encephalopathy   . Family history of adverse reaction to anesthesia    sister hard to wake up  . Gait abnormality   . Gram-positive bacteremia   . History of recurrent UTIs   . Hypernatremia   . Hypertension   . Hypokalemia   . Hypothyroidism   . MR (mental retardation)   . Muscle weakness (generalized)   . Postural kyphosis   . Renal disorder   . Sepsis(995.91)   . Severe intellectual disability   . Severe mental retardation   . Urinary retention   . UTI (urinary  tract infection)     Patient Active Problem List   Diagnosis Date Noted  . Pressure injury of skin 12/01/2017  . Functional quadriplegia (HCC)   . Goals of care, counseling/discussion   . DNR (do not resuscitate) discussion   . Palliative care by specialist   . Pneumonia 11/30/2017  . Dysphagia 11/30/2017  . Acute encephalopathy 11/30/2017  . Protein calorie malnutrition (HCC) 11/30/2017  . Encephalopathy 11/30/2017  . Neurogenic bladder 08/02/2016  . Sepsis (HCC) 11/02/2015  . UTI (urinary tract infection) 11/02/2015  . Sinus tachycardia 11/02/2015  . Dehydration 11/02/2015  . Leukocytosis 11/02/2015  . DM type 2 causing eye disease, not at goal [facility]) 11/02/2015  . Hypokalemia 03/04/2014  . Bacteremia due to Gram-positive bacteria 06/03/2011  . Hypernatremia 06/03/2011  . Hyperkalemia 05/29/2011  . DKA (diabetic ketoacidoses) (HCC) 05/29/2011  . Renal failure 05/29/2011  . Lower urinary tract infectious disease 05/29/2011    Past Surgical History:  Procedure Laterality Date  . CYSTOSCOPY N/A 08/02/2016   Procedure: CYSTOSCOPY;  Surgeon: Marcine Matar, MD;  Location: [facility];  Service: Urology;  Laterality: N/A;  . INSERTION OF SUPRAPUBIC CATHETER N/A 08/02/2016   Procedure: INSERTION OF SUPRAPUBIC CATHETER;  Surgeon: Marcine Matar, MD;  Location: [facility];  Service: Urology;  Laterality: N/A;  . KIDNEY STONE SURGERY     x  1  . tooth extraction surgery  as child        Home Medications    Prior to Admission medications   Medication Sig Start Date End Date Taking? Authorizing Provider  acetaminophen (TYLENOL) 650 MG CR tablet Take 650 mg by mouth every 8 (eight) hours as needed for pain.   Yes [provider]  acetic acid 0.25 % irrigation Irrigate with 1 application as directed 2 (two) times daily. Irrigate catheter every 12 hours   Yes [provider]  Alum & Mag Hydroxide-Simeth (ANTACID & ANTIGAS) 200-200-20 MG/5ML SUSP Take 30 mLs by mouth  daily as needed (heartburn).   Yes [provider]  furosemide (LASIX) 20 MG tablet Take 20 mg by mouth daily.   Yes [provider]  insulin aspart (NOVOLOG) 100 UNIT/ML injection Inject 2-10 Units into the skin 3 (three) times daily before meals. If 151-200 = 2 units, notify NP/MD if <60,   201-250 = 4 units, 251-300 = 6 units, 301-350 = 8 units, 351-400 = 10 units Notify MD/NP if > 400   Yes [provider]  insulin glargine (LANTUS) 100 UNIT/ML injection Inject 0.1 mLs (10 Units total) into the skin at bedtime. 11/11/15  Yes Standley BrookingGoodrich, Daniel P, MD  ipratropium (ATROVENT) 0.06 % nasal spray Place 2 sprays into both nostrils 2 (two) times daily as needed for rhinitis.   Yes [provider]  labetalol (NORMODYNE) 300 MG tablet Take 300 mg by mouth 2 (two) times daily. Hold if systolic BP is less than 100 10/30/15  Yes [provider]  levothyroxine (SYNTHROID, LEVOTHROID) 50 MCG tablet Take 50 mcg by mouth daily before breakfast.   Yes [provider]  magnesium hydroxide (MILK OF MAGNESIA) 400 MG/5ML suspension Take 30 mLs by mouth daily as needed for mild constipation.   Yes [provider]  mirtazapine (REMERON) 15 MG tablet Take 15 mg by mouth daily. For weight loss.   Yes [provider]  oxybutynin (DITROPAN-XL) 5 MG 24 hr tablet Take 1 tablet (5 mg total) by mouth at bedtime. 12/04/17  Yes Erick BlinksMemon, Jehanzeb, MD  potassium chloride SA (K-DUR,KLOR-CON) 20 MEQ tablet Take 20 mEq by mouth daily.   Yes [provider]  tamsulosin (FLOMAX) 0.4 MG CAPS capsule Take 1 capsule (0.4 mg total) by mouth daily. Hold if systolic BP is less than 100 12/04/17  Yes Memon, Durward MallardJehanzeb, MD  hydrocortisone cream 1 % Apply 1 application topically 3 (three) times daily. 12/18/17   Derwood KaplanNanavati, Jigar Zielke, MD  mupirocin ointment (BACTROBAN) 2 % Apply 1 application topically 3 (three) times daily. Apply to feet/leg topically every 8 hours for rash for 10 days  12/18/17   Derwood KaplanNanavati, Maximilliano Kersh, MD  NYSTATIN powder Apply topically 2 (two) times daily. 12/18/17   Derwood KaplanNanavati, Claudett Bayly, MD  sulfamethoxazole-trimethoprim (BACTRIM DS,SEPTRA DS) 800-160 MG tablet Take 1 tablet by mouth 2 (two) times daily for 14 days. 12/18/17 01/01/18  Derwood KaplanNanavati, Heidie Krall, MD    Family History History reviewed. No pertinent family history.  Social History Social History   Tobacco Use  . Smoking status: Never Smoker  . Smokeless tobacco: Never Used  Substance Use Topics  . Alcohol use: No  . Drug use: No     Allergies   Patient has no known allergies.   Review of Systems Review of Systems  Unable to perform ROS: Psychiatric disorder  Constitutional: Positive for activity change.  Skin: Positive for rash.     Physical Exam Updated Vital Signs  BP 94/65   Pulse 92   Temp 97.6 F (36.4 C) (Oral)   Resp 16   Ht 5\' 9"  (1.753 m)   Wt 60.8 kg (134 lb)   SpO2 98%   BMI 19.79 kg/m   Physical Exam  Constitutional: He is oriented to person, place, and time. He appears well-developed.  HENT:  Head: Atraumatic.  Neck: Neck supple.  Cardiovascular: Normal rate.  Pulmonary/Chest: Effort normal.  Abdominal: Soft. There is tenderness. There is no rebound and no guarding.  Genitourinary:  Genitourinary Comments: Suprapubic catheter in place, we are not able to see the insertion site of the suprapubic catheter because of patient's contraction  Musculoskeletal:  Contracted  Neurological: He is alert and oriented to person, place, and time.  Skin: Skin is warm. Rash noted.  Diaper dermatitis type rash, with satellite erythematous lesions in the groin area  Nursing note and vitals reviewed.    ED Treatments / Results  Labs (all labs ordered are listed, but only abnormal results are displayed) Labs Reviewed  CBC WITH DIFFERENTIAL/PLATELET - Abnormal; Notable for the following components:      Result Value   RBC 3.71 (*)    Hemoglobin 11.1 (*)    HCT 35.6 (*)    All  other components within normal limits  COMPREHENSIVE METABOLIC PANEL - Abnormal; Notable for the following components:   Glucose, Bld 255 (*)    Calcium 8.6 (*)    Albumin 2.6 (*)    AST 48 (*)    All other components within normal limits  URINALYSIS, ROUTINE W REFLEX MICROSCOPIC - Abnormal; Notable for the following components:   Color, Urine AMBER (*)    APPearance TURBID (*)    Hgb urine dipstick SMALL (*)    Protein, ur 30 (*)    Nitrite POSITIVE (*)    Leukocytes, UA LARGE (*)    WBC, UA >50 (*)    Bacteria, UA RARE (*)    All other components within normal limits  URINALYSIS, ROUTINE W REFLEX MICROSCOPIC - Abnormal; Notable for the following components:   Color, Urine AMBER (*)    APPearance TURBID (*)    Hgb urine dipstick SMALL (*)    Protein, ur 30 (*)    Nitrite POSITIVE (*)    Leukocytes, UA LARGE (*)    WBC, UA >50 (*)    Bacteria, UA FEW (*)    All other components within normal limits  I-STAT CG4 LACTIC ACID, ED - Abnormal; Notable for the following components:   Lactic Acid, Venous 3.09 (*)    All other components within normal limits  URINE CULTURE  CULTURE, BLOOD (ROUTINE X 2)  CULTURE, BLOOD (ROUTINE X 2)  URINE CULTURE  I-STAT CG4 LACTIC ACID, ED    EKG None  Radiology Dg Abd Acute W/chest  Result Date: 12/18/2017 CLINICAL DATA:  Generalized pain EXAM: DG ABDOMEN ACUTE W/ 1V CHEST COMPARISON:  Chest radiograph November 30, 2017 and abdominal radiograph November 07, 2015 FINDINGS: PA chest: Patient's mandible obscures a portion of the right upper lobe. Visualized lung regions are clear. Heart size and pulmonary vascular normal. No adenopathy. Supine and upright abdomen: There is moderate stool throughout colon. Note that the rectum is diffusely distended with stool. There is no bowel dilatation apart from the rectum or air-fluid level to suggest bowel obstruction. No free air. There is extensive splenic artery calcification. IMPRESSION: Rectum distended with stool.  No bowel obstruction or free air evident. Visualized lungs are clear. Note  that the patient's mandible obscures a portion of the right upper lobe. Electronically Signed   By: Bretta Bang III M.D.   On: 12/18/2017 16:21   Ct Renal Stone Study  Result Date: 12/18/2017 CLINICAL DATA:  Recent admission for UTI, purulence drainage around suprapubic catheter, flank pain EXAM: CT ABDOMEN AND PELVIS WITHOUT CONTRAST TECHNIQUE: Multidetector CT imaging of the abdomen and pelvis was performed following the standard protocol without IV contrast. COMPARISON:  Radiograph 12/18/2017, CT 12/10/2009 FINDINGS: Lower chest: Lung bases demonstrate multiple nodules and nodular densities within the posterior right upper lobe and within the right lower lobe. There are scattered tiny 2-3 mm nodules in the left lower lobe. No pleural effusion. Heart size within normal limits. Coronary vascular calcification. Hepatobiliary: Stable 7 mm hypodensity within the dome of the liver, benign given lack of interval change. No calcified gallstones or biliary dilatation. Pancreas: Unremarkable. No pancreatic ductal dilatation or surrounding inflammatory changes. Spleen: Normal in size without focal abnormality. Adrenals/Urinary Tract: Adrenal glands are within normal limits. No hydronephrosis. Punctate stones within the right kidney. Multiple stones in the left kidney including a large 2.4 cm stone in the midpole. Cortical hyperdense lesions within the upper to mid pole left kidney, measuring up to 14 mm. Bladder is partially decompressed by suprapubic Foley catheter. There is some soft tissue thickening around the catheter tract. No drainable abscess. Multiple bladder diverticula with small bladder stones present within the diverticula. Mildly thick-walled bladder. Small 2 mm stone near the right UVJ. Stomach/Bowel: The stomach is nonenlarged. No dilated small bowel. Moderate to large amount of stool in the colon with large retained feces  in the rectum. 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 Aortic atherosclerosis. No significantly enlarged lymph nodes. Reproductive: Prostate calcifications. Other: Negative for free air or free fluid. Tiny focus of gas within the subcutaneous fat at the left umbilical region. Musculoskeletal: Chronic compression deformity at T11. Edema within the subcutaneous fat overlying the sacrococcygeal region with possible overlying ulcer. No focal fluid collection. IMPRESSION: 1. Negative for hydronephrosis. Multiple stones within both kidneys, including a large 2.4 cm stone in the midpole of the left kidney. Mildly thick-walled urinary bladder with multiple diverticula and multiple bladder stones, several of which are in decompressed bladder diverticula. Mild edema around the track of the suprapubic catheter but without focal fluid collection to suggest drainable abscess. 2. Multiple nodules and nodular densities within the posterior right upper lobe and right greater than left lower lobes. Findings could be secondary to atypical pneumonia, or possible chronic aspiration. 3. Moderate to large amount of stool in the colon with large retained feces in the rectum, rectal distention up to 9.4 cm. 4. Diverticular disease of the colon without acute inflammation 5. Hyperdense nodules within the left kidney, possible hemorrhagic or proteinaceous cysts but further evaluation limited without contrast. Nonemergent MRI could be obtained to further evaluate. 6. Edema within the subcutaneous fat of the sacrococcygeal region with possible skin ulcer, correlate with physical exam. Electronically Signed   By: Jasmine Pang M.D.   On: 12/18/2017 18:44    Procedures Procedures (including critical care time)  Medications Ordered in ED Medications  barrier cream (non-specified) 1 application (1 application Topical Given by Other 12/18/17 1845)  sodium chloride 0.9 % bolus 1,000  mL (0 mLs Intravenous Stopped 12/18/17 1646)  piperacillin-tazobactam (ZOSYN) IVPB 3.375 g (0 g Intravenous Stopped 12/18/17 1729)  ketorolac (TORADOL) 30 MG/ML injection 15 mg (15 mg Intravenous Given 12/18/17 1931)  Initial Impression / Assessment and Plan / ED Course  I have reviewed the triage vital signs and the nursing notes.  Pertinent labs & imaging results that were available during my care of the patient were reviewed by me and considered in my medical decision making (see chart for details).  Clinical Course as of Dec 19 16  Wynelle Link Dec 18, 2017  4259 Patient is a smal right sided UVJ stone, however there is no hydronephrosis.  There is also multiple bladder diverticulum.  I spoke with Dr. Annabell Howells, urology.  He recommends that we pull back the suprapubic catheter 1 to 1.5 inch -because the tip of the catheter appears to be in a diverticulum, leading to partial decompression of the bladder.  There is no hydronephrosis, and the stone would not require stenting at this time.  He recommends admitting patient for observation if needed.  Urology will be back on at any pain on Tuesday, if patient is still in the hospital and needs consultation then hospitalist team can consult at that time.  CT Renal Stone Study [AN]  2005 Results from the ER have been discussed with the family members were at the bedside.  I also discussed the case with Dr. Sherryll Burger, hospitalist, who agrees that patient can be managed as an outpatient with Bactrim prescription for now and follow-up with urology.  Family is okay with this plan. I am also comfortable with this plan, given that patient is going to a nursing home.  We have discussed strict ER return precautions, and I will type those in the discharge instructions as well.  Stable for discharge at this time. Patient is slightly tachycardic, likely because of his pain and UTI.   [AN]  2015 Family would want the urologist to look at the suprapubic catheter, rather  than it being pulled out 1 inch as per the recommendation by Dr. Annabell Howells in the ED.  The rationale is that since the Foley catheter is draining right now, it might be best for the urologist to see the patient in the clinic, and likely just change the catheter.  I think there thinking is quite rational -we will allow for you outpatient urologic evaluation.   [AN]    Clinical Course User Index [AN] Derwood Kaplan, MD    Patient cognitive delay comes in with chief complaint of pain. Patient is noted to be slightly tachycardic, otherwise he does not have a fever and our exam does not reveal any focal abnormalities.  Patient does appear slightly uncomfortable.  Acute abdominal series will be ordered to see if there is any evidence of small bowel obstruction, ileus or pneumonia.  If negative, we will consider getting CT scan of the abdomen pelvis given that patient has history of multiple stones.  Based on the abdominal exam, there is no clinical concerns for intra-abdominal abscess.  He has had UTIs recently which were appropriately treated.  I do not think patient has perinephric abscess at this time, given that his white count is normal and there is no fever.  Final Clinical Impressions(s) / ED Diagnoses   Final diagnoses:  Candida infection  Renal stones  Cystitis    ED Discharge Orders        Ordered    NYSTATIN powder  2 times daily     12/18/17 2005    mupirocin ointment (BACTROBAN) 2 %  3 times daily     12/18/17 2005    hydrocortisone cream 1 %  3 times daily  12/18/17 2005    sulfamethoxazole-trimethoprim (BACTRIM DS,SEPTRA DS) 800-160 MG tablet  2 times daily     12/18/17 Juanita Laster, MD 12/19/17 0020

## 2017-12-18 NOTE — Progress Notes (Signed)
Patient ID: Jacob GavelGene R [name], male   DOB: 09-Oct-1955, 62 y.o.   MRN: [id number]  I Spoke to Dr. Rhunette CroftNanavati about [name] and reviewed his CT.  He has no hydro and the 2mm calcification that was called at the right UVJ could actually be in the bladder.   The tip of the SP tube is actually in a left posterior diverticulum and the SP tube is not completely draining the bladder.  His WBC is normal and his Cr is normal.  He is tachycardic but afebrile and normotensive.  His lactate is elevated.   I have recommended the SP tube be pulled back 1-1.5 inches to see if will drain better.   I don't think he need other urologic intervention at this time, but am available if that is not helpful.    He otherwise should be admitted for medical management.

## 2017-12-18 NOTE — ED Notes (Signed)
[facility]

## 2017-12-18 NOTE — ED Notes (Signed)
Date and time results received: 12/18/17 3:28 PM  Test: Lactic Acid Critical Value: 3.09  Name of Provider Notified: Dr. Rhunette CroftNanavati  Orders Received? Or Actions Taken?:Saline Bolus

## 2017-12-18 NOTE — ED Triage Notes (Signed)
Unsure exactly why pt is here.  [facility] has been yelling for help all morning and clawing at his right leg leaving bleeding scratches.  Stated due to his inability to communicate he had to come here for workup.  Recent admission for UTI.  Purulent drainage and yeast noted around suprapubic catheter site.

## 2017-12-18 NOTE — ED Notes (Signed)
Pt given Sprite per MD approval. 

## 2017-12-21 LAB — URINE CULTURE
Culture: >=100000 — AB
Culture: >=100000 — AB

## 2017-12-22 ENCOUNTER — Telehealth: Payer: Self-pay | Admitting: Emergency Medicine

## 2017-12-22 NOTE — Progress Notes (Signed)
ED Antimicrobial Stewardship Positive Culture Follow Up   [name] is an 62 y.o. male who presented to [facility] on 12/18/2017 with a chief complaint of  Chief Complaint  Patient presents with  . Rash    Recent Results (from the past 720 hour(s))  Urine culture     Status: Abnormal (Preliminary result)   Collection Time: 11/30/17 12:00 PM  Result Value Ref Range Status   Specimen Description   Final    URINE, CLEAN CATCH Performed at [facility], [address] [place], [place] [address]    Special Requests   Final    NONE Performed at [facility], [address] [place], [place] [address]    Culture (A)  Final    >=100,000 COLONIES/mL ESCHERICHIA COLI 30,000 COLONIES/mL PROTEUS MIRABILIS PROTEUS MIRABILIS    Report Status PENDING  Incomplete   Organism ID, Bacteria PROTEUS MIRABILIS (A)  Final   Organism ID, Bacteria ESCHERICHIA COLI (A)  Final   Organism ID, Bacteria PROTEUS MIRABILIS  Final      Susceptibility   Escherichia coli - MIC*    AMPICILLIN >=32 RESISTANT Resistant     CEFAZOLIN >=64 RESISTANT Resistant     CEFTRIAXONE >=64 RESISTANT Resistant     CIPROFLOXACIN >=4 RESISTANT Resistant     GENTAMICIN <=1 SENSITIVE Sensitive     IMIPENEM <=0.25 SENSITIVE Sensitive     NITROFURANTOIN <=16 SENSITIVE Sensitive     TRIMETH/SULFA <=20 SENSITIVE Sensitive     AMPICILLIN/SULBACTAM >=32 RESISTANT Resistant     PIP/TAZO 8 SENSITIVE Sensitive     Extended ESBL POSITIVE Resistant     * >=100,000 COLONIES/mL ESCHERICHIA COLI   Proteus mirabilis - MIC*    AMPICILLIN 4 SENSITIVE Sensitive     CEFAZOLIN <=4 SENSITIVE Sensitive     CEFTRIAXONE <=1 SENSITIVE Sensitive     CIPROFLOXACIN >=4 RESISTANT Resistant     GENTAMICIN <=1 SENSITIVE Sensitive     IMIPENEM <=0.25 SENSITIVE Sensitive     NITROFURANTOIN 128 RESISTANT Resistant     TRIMETH/SULFA <=20 SENSITIVE Sensitive     AMPICILLIN/SULBACTAM <=2 SENSITIVE Sensitive     PIP/TAZO <=4 SENSITIVE Sensitive    Proteus mirabilis - MIC*    AMPICILLIN 4 SENSITIVE Sensitive     CEFAZOLIN <=4 SENSITIVE Sensitive     CEFTRIAXONE <=1 SENSITIVE Sensitive     CIPROFLOXACIN >=4 RESISTANT Resistant     GENTAMICIN <=1 SENSITIVE Sensitive     IMIPENEM <=0.25 SENSITIVE Sensitive     NITROFURANTOIN 128 RESISTANT Resistant     TRIMETH/SULFA <=20 SENSITIVE Sensitive     AMPICILLIN/SULBACTAM <=2 SENSITIVE Sensitive     PIP/TAZO <=4 SENSITIVE Sensitive     * 30,000 COLONIES/mL PROTEUS MIRABILIS    PROTEUS MIRABILIS  Culture, blood (routine x 2)     Status: None   Collection Time: 11/30/17 12:14 PM  Result Value Ref Range Status   Specimen Description BLOOD RIGHT ARM  Final   Special Requests   Final    BOTTLES DRAWN AEROBIC AND ANAEROBIC Blood Culture adequate volume   Culture   Final    NO GROWTH 5 DAYS Performed at [facility], [address] [place], [place] [address]    Report Status 12/05/2017 FINAL  Final  Culture, blood (routine x 2)     Status: Abnormal   Collection Time: 11/30/17 12:14 PM  Result Value Ref Range Status   Specimen Description   Final    BLOOD RIGHT HAND Performed at [facility], [address], [place], [place] [address]    Special Requests   Final    BOTTLES DRAWN AEROBIC AND ANAEROBIC Blood Culture adequate volume Performed at [facility], [address] [place], [place] [address]    Culture  Setup Time   Final    GRAM POSITIVE COCCI IN CLUSTERS Gram Stain Report Called to,Read Back By and Verified With: ROBINSON,J AT 0905 BY HUFFINES,S ON 12/01/17. AEROBIC BOTTLE ONLY    Culture (A)  Final    STAPHYLOCOCCUS SPECIES (COAGULASE NEGATIVE) THE SIGNIFICANCE OF ISOLATING THIS ORGANISM FROM A SINGLE SET OF BLOOD CULTURES WHEN MULTIPLE SETS ARE DRAWN IS UNCERTAIN. PLEASE NOTIFY THE MICROBIOLOGY DEPARTMENT WITHIN ONE WEEK IF SPECIATION AND SENSITIVITIES ARE REQUIRED. Performed at [facility], [address] [place], [place] [address]    Report Status 12/03/2017 FINAL   Final  Blood Culture ID Panel (Reflexed)     Status: Abnormal   Collection Time: 11/30/17 12:14 PM  Result Value Ref Range Status   Enterococcus species NOT DETECTED NOT DETECTED Final   Listeria monocytogenes NOT DETECTED NOT DETECTED Final   Staphylococcus species DETECTED (A) NOT DETECTED Final    Comment: Methicillin (oxacillin) susceptible coagulase negative staphylococcus. Possible blood culture contaminant (unless isolated from more than one blood culture draw or clinical case suggests pathogenicity). No antibiotic treatment is indicated for blood  culture contaminants. CRITICAL RESULT CALLED TO, READ BACK BY AND VERIFIED WITH: Ermalene Searing PharmD 12:25 12/01/17 ([email or web address])    Staphylococcus aureus NOT DETECTED NOT DETECTED Final   Methicillin resistance NOT DETECTED NOT DETECTED Final   Streptococcus species NOT DETECTED NOT DETECTED Final   Streptococcus agalactiae NOT DETECTED NOT DETECTED Final   Streptococcus pneumoniae NOT DETECTED NOT DETECTED Final   Streptococcus pyogenes NOT DETECTED NOT DETECTED Final   Acinetobacter baumannii NOT DETECTED NOT DETECTED Final   Enterobacteriaceae species NOT DETECTED NOT DETECTED Final   Enterobacter cloacae complex NOT DETECTED NOT DETECTED Final   Escherichia coli NOT DETECTED NOT DETECTED Final   Klebsiella oxytoca NOT DETECTED NOT DETECTED Final   Klebsiella pneumoniae NOT DETECTED NOT DETECTED Final   Proteus species NOT DETECTED NOT DETECTED Final   Serratia marcescens NOT DETECTED NOT DETECTED Final   Haemophilus influenzae NOT DETECTED NOT DETECTED Final   Neisseria meningitidis NOT DETECTED NOT DETECTED Final   Pseudomonas aeruginosa NOT DETECTED NOT DETECTED Final   Candida albicans NOT DETECTED NOT DETECTED Final   Candida glabrata NOT DETECTED NOT DETECTED Final   Candida krusei NOT DETECTED NOT DETECTED Final   Candida parapsilosis NOT DETECTED NOT DETECTED Final   Candida tropicalis NOT DETECTED NOT DETECTED Final    Comment:  Performed at [facility], [address] [place], [place] [address]  MRSA PCR Screening     Status: Abnormal   Collection Time: 11/30/17  6:09 PM  Result Value Ref Range Status   MRSA by PCR POSITIVE (A) NEGATIVE Final    Comment:        The GeneXpert MRSA Assay (FDA approved for NASAL specimens only), is one component of a comprehensive MRSA colonization surveillance program. It is not intended to diagnose MRSA infection nor to guide or monitor treatment for MRSA infections. RESULT CALLED TO, READ BACK BY AND VERIFIED WITH: MARTIN,J AT 2259 ON 7.3.2019 BY ISLEY,B Performed at [facility], [address] [place], [place] [address]   Respiratory Panel by PCR     Status: None   Collection Time: 11/30/17 11:30 PM  Result Value Ref  Range Status   Adenovirus NOT DETECTED NOT DETECTED Final   Coronavirus 229E NOT DETECTED NOT DETECTED Final   Coronavirus HKU1 NOT DETECTED NOT DETECTED Final   Coronavirus NL63 NOT DETECTED NOT DETECTED Final   Coronavirus OC43 NOT DETECTED NOT DETECTED Final   Metapneumovirus NOT DETECTED NOT DETECTED Final   Rhinovirus / Enterovirus NOT DETECTED NOT DETECTED Final   Influenza A NOT DETECTED NOT DETECTED Final   Influenza B NOT DETECTED NOT DETECTED Final   Parainfluenza Virus 1 NOT DETECTED NOT DETECTED Final   Parainfluenza Virus 2 NOT DETECTED NOT DETECTED Final   Parainfluenza Virus 3 NOT DETECTED NOT DETECTED Final   Parainfluenza Virus 4 NOT DETECTED NOT DETECTED Final   Respiratory Syncytial Virus NOT DETECTED NOT DETECTED Final   Bordetella pertussis NOT DETECTED NOT DETECTED Final   Chlamydophila pneumoniae NOT DETECTED NOT DETECTED Final   Mycoplasma pneumoniae NOT DETECTED NOT DETECTED Final  Urine culture     Status: Abnormal   Collection Time: 12/18/17  3:14 PM  Result Value Ref Range Status   Specimen Description   Final    URINE, CLEAN CATCH Performed at [facility], [address], [place], [place] [address]    Special  Requests   Final    NONE Performed at [facility], [address], [place], [place] [address]    Culture (A)  Final    >=100,000 COLONIES/mL ESCHERICHIA COLI Confirmed Extended Spectrum Beta-Lactamase Producer (ESBL).  In bloodstream infections from ESBL organisms, carbapenems are preferred over piperacillin/tazobactam. They are shown to have a lower risk of mortality. Performed at [facility], [address], [place], [place] [address]    Report Status 12/21/2017 FINAL  Final   Organism ID, Bacteria ESCHERICHIA COLI (A)  Final      Susceptibility   Escherichia coli - MIC*    AMPICILLIN >=32 RESISTANT Resistant     CEFAZOLIN >=64 RESISTANT Resistant     CEFTRIAXONE >=64 RESISTANT Resistant     CIPROFLOXACIN >=4 RESISTANT Resistant     GENTAMICIN <=1 SENSITIVE Sensitive     IMIPENEM <=0.25 SENSITIVE Sensitive     NITROFURANTOIN <=16 SENSITIVE Sensitive     TRIMETH/SULFA >=320 RESISTANT Resistant     AMPICILLIN/SULBACTAM >=32 RESISTANT Resistant     PIP/TAZO 16 SENSITIVE Sensitive     Extended ESBL POSITIVE Resistant     * >=100,000 COLONIES/mL ESCHERICHIA COLI  Blood culture (routine x 2)     Status: None (Preliminary result)   Collection Time: 12/18/17  4:53 PM  Result Value Ref Range Status   Specimen Description BLOOD RIGHT WRIST  Final   Special Requests   Final    BOTTLES DRAWN AEROBIC ONLY Blood Culture adequate volume   Culture   Final    NO GROWTH 4 DAYS Performed at [facility], [address] [place], [place] [address]    Report Status PENDING  Incomplete  Blood culture (routine x 2)     Status: None (Preliminary result)   Collection Time: 12/18/17  4:54 PM  Result Value Ref Range Status   Specimen Description BLOOD RIGHT HAND  Final   Special Requests   Final    BOTTLES DRAWN AEROBIC AND ANAEROBIC Blood Culture adequate volume   Culture   Final    NO GROWTH 4 DAYS Performed at [facility], [address] [place], [place] [address]    Report Status  PENDING  Incomplete  Urine culture     Status: Abnormal   Collection  Time: 12/18/17  6:20 PM  Result Value Ref Range Status   Specimen Description   Final    URINE, SUPRAPUBIC Performed at [facility], [address] [place], [place] [address]    Special Requests   Final    NONE Performed at [facility], [address] [place], [place] [address]    Culture (A)  Final    >=100,000 COLONIES/mL ESCHERICHIA COLI Confirmed Extended Spectrum Beta-Lactamase Producer (ESBL).  In bloodstream infections from ESBL organisms, carbapenems are preferred over piperacillin/tazobactam. They are shown to have a lower risk of mortality. Performed at [facility], [address] [place], [place] [address]    Report Status 12/21/2017 FINAL  Final   Organism ID, Bacteria ESCHERICHIA COLI (A)  Final      Susceptibility   Escherichia coli - MIC*    AMPICILLIN >=32 RESISTANT Resistant     CEFAZOLIN >=64 RESISTANT Resistant     CEFTRIAXONE >=64 RESISTANT Resistant     CIPROFLOXACIN >=4 RESISTANT Resistant     GENTAMICIN <=1 SENSITIVE Sensitive     IMIPENEM <=0.25 SENSITIVE Sensitive     NITROFURANTOIN <=16 SENSITIVE Sensitive     TRIMETH/SULFA >=320 RESISTANT Resistant     AMPICILLIN/SULBACTAM >=32 RESISTANT Resistant     PIP/TAZO 8 SENSITIVE Sensitive     Extended ESBL POSITIVE Resistant     * >=100,000 COLONIES/mL ESCHERICHIA COLI    [x]  Treated with bactrim, organism resistant to prescribed antimicrobial []  Patient discharged originally without antimicrobial agent and treatment is now indicated  New antibiotic prescription: DC bactrim and follow-up with urology. Start macrobid 100mg   PO BID x 10 days  ED Provider: Harlene SaltsBrandon [name], PA   Verlie Liotta, Drake LeachRachel Lynn 12/22/2017, 9:41 AM Clinical Pharmacist Monday - Friday phone -  [phone] Saturday - Sunday phone - [phone]

## 2017-12-22 NOTE — Telephone Encounter (Signed)
Post ED Visit - Positive Culture Follow-up: Successful Patient Follow-Up  Culture assessed and recommendations reviewed by:  []  Enzo BiNathan Batchelder, Pharm.D. []  Celedonio MiyamotoJeremy Frens, Pharm.D., BCPS AQ-ID []  Garvin FilaMike Maccia, Pharm.D., BCPS []  Georgina PillionElizabeth Martin, Pharm.D., BCPS []  AcmeMinh Pham, [address]Ph[address]D., BCPS, AAHIVP []  Estella HuskMichelle Turner, Pharm.D., BCPS, AAHIVP [x]  Lysle Pearlachel Rumbarger, PharmD, BCPS []  Phillips Climeshuy Dang, PharmD, BCPS []  Agapito GamesAlison Masters, PharmD, BCPS []  Verlan FriendsErin Deja, PharmD  Positive urine culture  []  Patient discharged without antimicrobial prescription and treatment is now indicated [x]  Organism is resistant to prescribed ED discharge antimicrobial []  Patient with positive blood cultures  Changes discussed with ED provider: [name]  [name] PA New antibiotic prescription d/c Bactrim, start macrobid 100mg  po bid x 10 days Faxed to [facility] [phone] attn April     [name], [name] 12/22/2017, 1:36 PM

## 2017-12-23 LAB — CULTURE, BLOOD (ROUTINE X 2)
CULTURE: NO GROWTH
Culture: NO GROWTH
SPECIAL REQUESTS: ADEQUATE
Special Requests: ADEQUATE

## 2017-12-27 ENCOUNTER — Ambulatory Visit (INDEPENDENT_AMBULATORY_CARE_PROVIDER_SITE_OTHER): Payer: Medicare Other | Admitting: Urology

## 2017-12-27 DIAGNOSIS — N3020 Other chronic cystitis without hematuria: Secondary | ICD-10-CM

## 2017-12-27 DIAGNOSIS — N312 Flaccid neuropathic bladder, not elsewhere classified: Secondary | ICD-10-CM | POA: Diagnosis not present

## 2018-02-11 IMAGING — RF DG SWALLOWING FUNCTION
4 series · 12 of 12 positions shown · non-contrast
Comparison: None.

CLINICAL DATA: 60-year-old male with developmental delay presenting
with dysphagia and persisting cough. Initial encounter.

EXAM:
MODIFIED BARIUM SWALLOW
TECHNIQUE: Different consistencies of barium were administered orally to the
patient by the Speech Pathologist. Imaging of the pharynx was
performed in the lateral projection.
FLUOROSCOPY TIME:  Radiation Exposure Index (as provided by the
fluoroscopic device): 3.3 mGy
Fluoroscopy Time:  2 minutes

[Series 1: cp_standard · 0.17mm/px · 1 of 1 slices shown (1 of 4)]
[im 1/1]
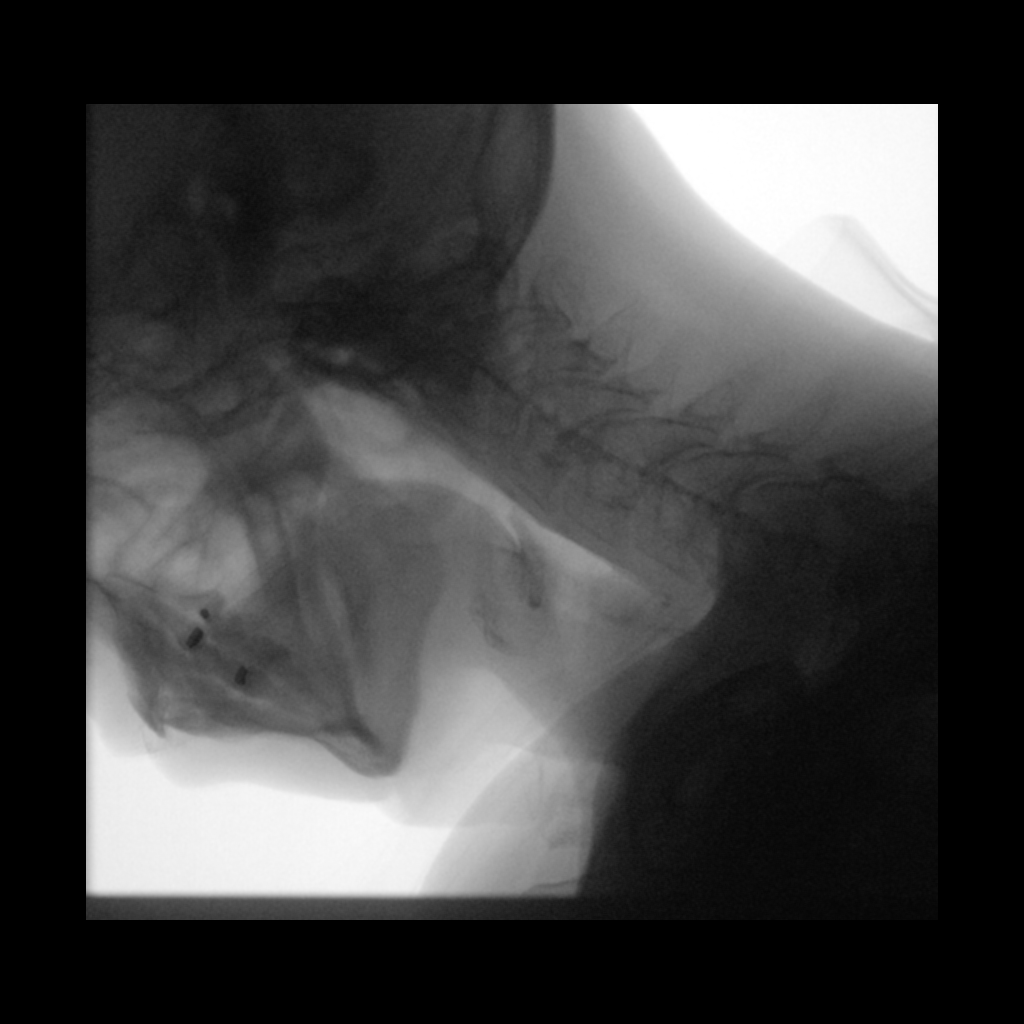

[Series 2: cp_standard · 0.34mm/px · 3 of 92 frames shown (2 of 4)]
[frame 14/92]
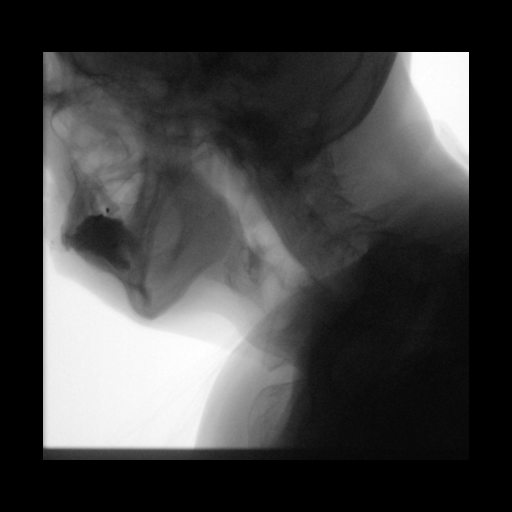
[frame 47/92]
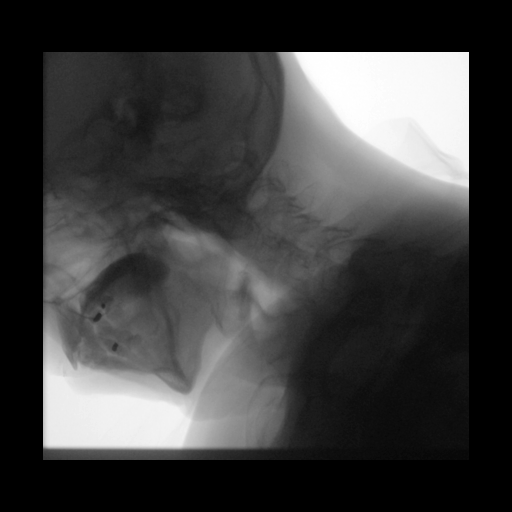
[frame 79/92]
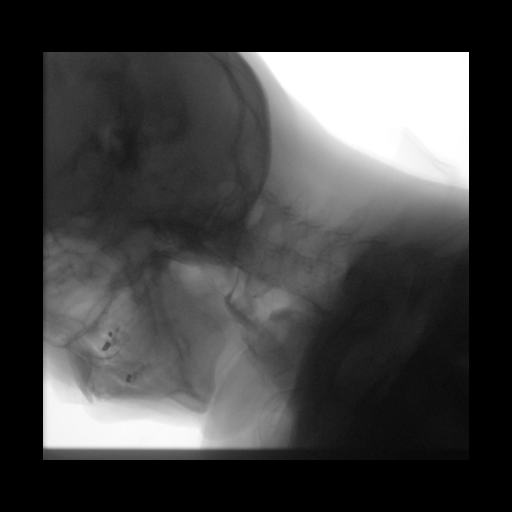

[Series 3: cp_standard · 0.34mm/px · 4 of 31 frames shown (3 of 4)]
[frame 5/31]
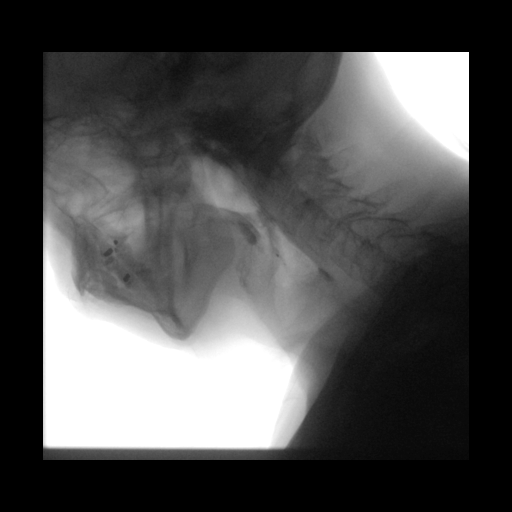
[frame 16/31]
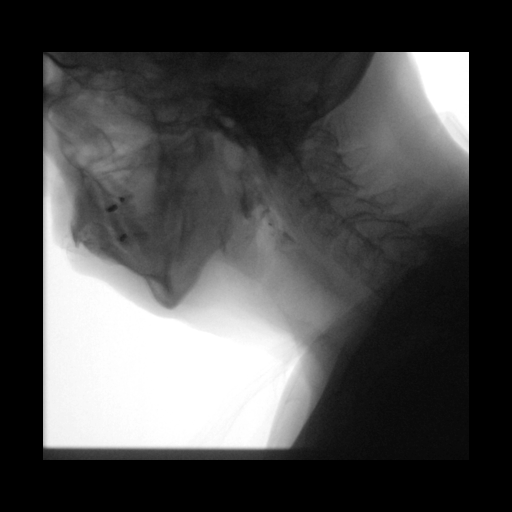
[frame 27/31]
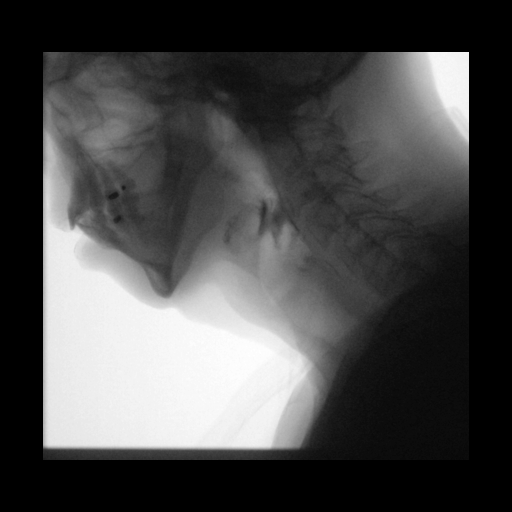
[frame 31/31]
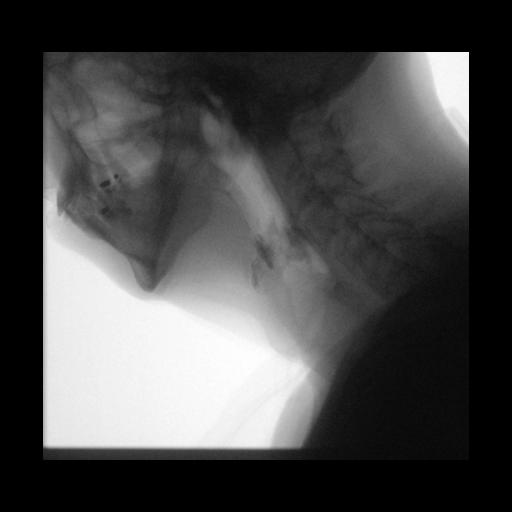

[Series 4: cp_standard · 0.34mm/px · 4 of 17 frames shown (4 of 4)]
[frame 3/17]
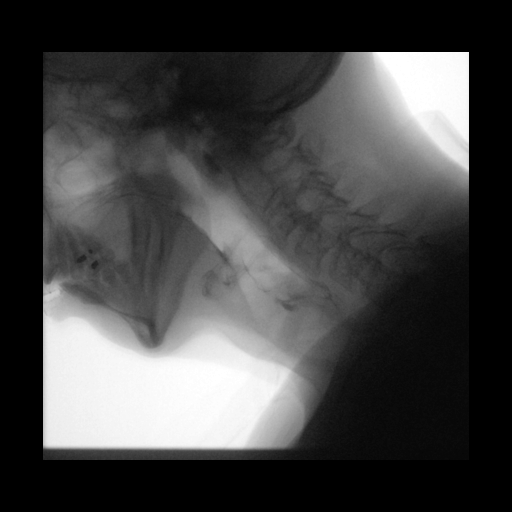
[frame 9/17]
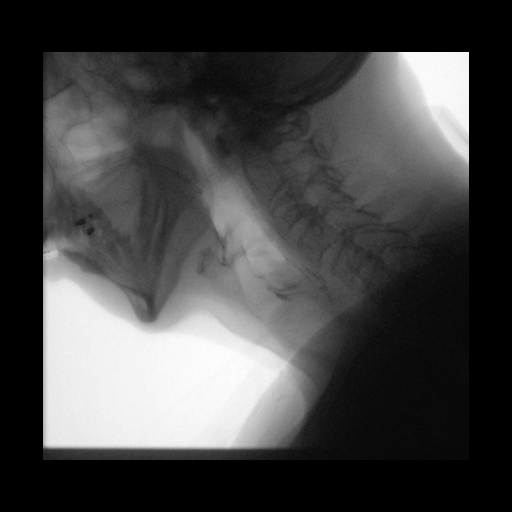
[frame 15/17]
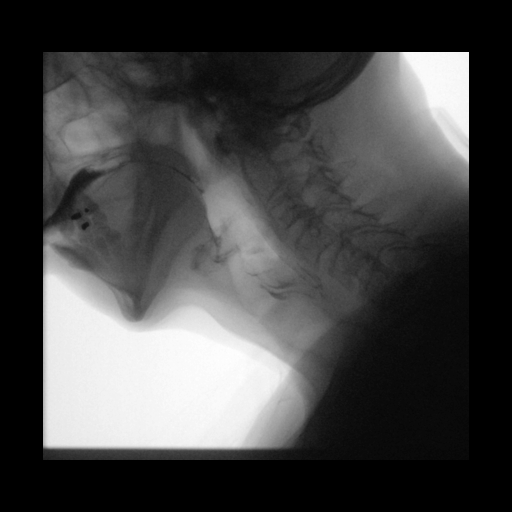
[frame 16/17]
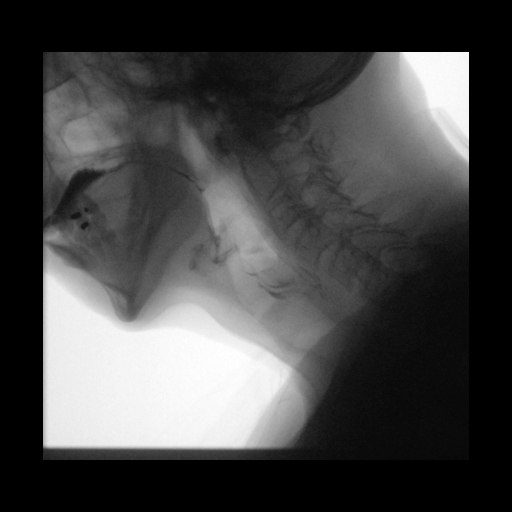

[12 of 12 positions shown; findings below may reference images not displayed]

FINDINGS: The patient was not cooperative. With ingested substances, there is
pooling within the vallecula but no laryngeal penetration or
aspiration. Please see speech pathology report.
IMPRESSION: The patient was not cooperative. With ingested substances, there is
pooling within the vallecula but no laryngeal penetration or
aspiration.

Please refer to the Speech Pathologists report for complete details
and recommendations.

## 2018-04-30 DEATH — deceased
